# Patient Record
Sex: Female | Born: 1980 | Race: White | Hispanic: Yes | Marital: Married | Smoking: Never smoker
Health system: Southern US, Community
[De-identification: ages and names within clinical notes are randomized; demographics above are authoritative.]

## PROBLEM LIST (undated history)

## (undated) ENCOUNTER — Inpatient Hospital Stay (HOSPITAL_COMMUNITY): Payer: Self-pay

## (undated) DIAGNOSIS — Z789 Other specified health status: Secondary | ICD-10-CM

## (undated) DIAGNOSIS — Z8619 Personal history of other infectious and parasitic diseases: Secondary | ICD-10-CM

## (undated) DIAGNOSIS — K219 Gastro-esophageal reflux disease without esophagitis: Secondary | ICD-10-CM

## (undated) DIAGNOSIS — O24419 Gestational diabetes mellitus in pregnancy, unspecified control: Secondary | ICD-10-CM

## (undated) DIAGNOSIS — S82899A Other fracture of unspecified lower leg, initial encounter for closed fracture: Secondary | ICD-10-CM

## (undated) HISTORY — DX: Gestational diabetes mellitus in pregnancy, unspecified control: O24.419

## (undated) HISTORY — DX: Personal history of other infectious and parasitic diseases: Z86.19

## (undated) HISTORY — PX: NO PAST SURGERIES: SHX2092

---

## 2001-01-14 ENCOUNTER — Emergency Department (HOSPITAL_COMMUNITY): Admission: EM | Admit: 2001-01-14 | Discharge: 2001-01-14 | Payer: Self-pay | Admitting: Emergency Medicine

## 2001-06-19 ENCOUNTER — Encounter: Payer: Self-pay | Admitting: Emergency Medicine

## 2001-06-19 ENCOUNTER — Emergency Department (HOSPITAL_COMMUNITY): Admission: EM | Admit: 2001-06-19 | Discharge: 2001-06-20 | Payer: Self-pay | Admitting: Emergency Medicine

## 2001-07-03 ENCOUNTER — Encounter: Payer: Self-pay | Admitting: *Deleted

## 2001-07-03 ENCOUNTER — Inpatient Hospital Stay (HOSPITAL_COMMUNITY): Admission: AD | Admit: 2001-07-03 | Discharge: 2001-07-03 | Payer: Self-pay | Admitting: *Deleted

## 2001-07-04 ENCOUNTER — Inpatient Hospital Stay (HOSPITAL_COMMUNITY): Admission: AD | Admit: 2001-07-04 | Discharge: 2001-07-04 | Payer: Self-pay | Admitting: Obstetrics and Gynecology

## 2001-10-02 ENCOUNTER — Encounter (HOSPITAL_COMMUNITY): Admission: RE | Admit: 2001-10-02 | Discharge: 2001-11-01 | Payer: Self-pay | Admitting: *Deleted

## 2001-10-09 ENCOUNTER — Encounter: Payer: Self-pay | Admitting: *Deleted

## 2001-11-06 ENCOUNTER — Encounter (HOSPITAL_COMMUNITY): Admission: RE | Admit: 2001-11-06 | Discharge: 2001-12-06 | Payer: Self-pay | Admitting: *Deleted

## 2001-12-06 ENCOUNTER — Inpatient Hospital Stay (HOSPITAL_COMMUNITY): Admission: AD | Admit: 2001-12-06 | Discharge: 2001-12-09 | Payer: Self-pay | Admitting: *Deleted

## 2001-12-10 ENCOUNTER — Encounter: Admission: RE | Admit: 2001-12-10 | Discharge: 2002-01-09 | Payer: Self-pay | Admitting: *Deleted

## 2002-02-09 ENCOUNTER — Encounter: Admission: RE | Admit: 2002-02-09 | Discharge: 2002-03-11 | Payer: Self-pay | Admitting: *Deleted

## 2002-03-12 ENCOUNTER — Encounter: Admission: RE | Admit: 2002-03-12 | Discharge: 2002-04-11 | Payer: Self-pay | Admitting: *Deleted

## 2008-06-16 ENCOUNTER — Inpatient Hospital Stay (HOSPITAL_COMMUNITY): Admission: RE | Admit: 2008-06-16 | Discharge: 2008-06-18 | Payer: Self-pay | Admitting: Obstetrics

## 2010-05-04 LAB — CBC
HCT: 33.1 % — ABNORMAL LOW (ref 36.0–46.0)
HCT: 35.7 % — ABNORMAL LOW (ref 36.0–46.0)
Hemoglobin: 11.6 g/dL — ABNORMAL LOW (ref 12.0–15.0)
Hemoglobin: 12.4 g/dL (ref 12.0–15.0)
MCHC: 34.6 g/dL (ref 30.0–36.0)
MCHC: 35.1 g/dL (ref 30.0–36.0)
MCV: 92.2 fL (ref 78.0–100.0)
MCV: 92.2 fL (ref 78.0–100.0)
Platelets: 182 10*3/uL (ref 150–400)
Platelets: 205 10*3/uL (ref 150–400)
RBC: 3.59 MIL/uL — ABNORMAL LOW (ref 3.87–5.11)
RBC: 3.88 MIL/uL (ref 3.87–5.11)
RDW: 15 % (ref 11.5–15.5)
RDW: 15.1 % (ref 11.5–15.5)
WBC: 10.6 10*3/uL — ABNORMAL HIGH (ref 4.0–10.5)
WBC: 16.1 10*3/uL — ABNORMAL HIGH (ref 4.0–10.5)

## 2010-05-04 LAB — CCBB MATERNAL DONOR DRAW

## 2010-05-04 LAB — RPR: RPR Ser Ql: NONREACTIVE

## 2010-05-04 LAB — RAPID HIV SCREEN (WH-MAU): Rapid HIV Screen: NONREACTIVE

## 2014-07-15 ENCOUNTER — Encounter (HOSPITAL_COMMUNITY): Payer: Self-pay | Admitting: *Deleted

## 2014-07-15 ENCOUNTER — Inpatient Hospital Stay (HOSPITAL_COMMUNITY): Payer: Self-pay

## 2014-07-15 ENCOUNTER — Inpatient Hospital Stay (HOSPITAL_COMMUNITY)
Admission: AD | Admit: 2014-07-15 | Discharge: 2014-07-15 | Disposition: A | Discharge status: Home / Self Care | Payer: Self-pay | Source: Ambulatory Visit | Attending: Family Medicine | Admitting: Family Medicine

## 2014-07-15 DIAGNOSIS — B9689 Other specified bacterial agents as the cause of diseases classified elsewhere: Secondary | ICD-10-CM | POA: Insufficient documentation

## 2014-07-15 DIAGNOSIS — Z3A11 11 weeks gestation of pregnancy: Secondary | ICD-10-CM | POA: Insufficient documentation

## 2014-07-15 DIAGNOSIS — O209 Hemorrhage in early pregnancy, unspecified: Secondary | ICD-10-CM | POA: Insufficient documentation

## 2014-07-15 DIAGNOSIS — A499 Bacterial infection, unspecified: Secondary | ICD-10-CM

## 2014-07-15 DIAGNOSIS — O23591 Infection of other part of genital tract in pregnancy, first trimester: Secondary | ICD-10-CM | POA: Insufficient documentation

## 2014-07-15 DIAGNOSIS — N760 Acute vaginitis: Secondary | ICD-10-CM | POA: Insufficient documentation

## 2014-07-15 HISTORY — DX: Other specified health status: Z78.9

## 2014-07-15 LAB — URINALYSIS, ROUTINE W REFLEX MICROSCOPIC
BILIRUBIN URINE: NEGATIVE
Glucose, UA: NEGATIVE mg/dL
KETONES UR: NEGATIVE mg/dL
Leukocytes, UA: NEGATIVE
Nitrite: NEGATIVE
PROTEIN: NEGATIVE mg/dL
Specific Gravity, Urine: <1.005 — ABNORMAL LOW (ref 1.005–1.030)
UROBILINOGEN UA: 0.2 mg/dL (ref 0.0–1.0)
pH: 6.5 (ref 5.0–8.0)

## 2014-07-15 LAB — CBC
HEMATOCRIT: 34.9 % — AB (ref 36.0–46.0)
Hemoglobin: 12.1 g/dL (ref 12.0–15.0)
MCH: 30.9 pg (ref 26.0–34.0)
MCHC: 34.7 g/dL (ref 30.0–36.0)
MCV: 89 fL (ref 78.0–100.0)
Platelets: 227 10*3/uL (ref 150–400)
RBC: 3.92 MIL/uL (ref 3.87–5.11)
RDW: 13.5 % (ref 11.5–15.5)
WBC: 11.5 10*3/uL — AB (ref 4.0–10.5)

## 2014-07-15 LAB — WET PREP, GENITAL
TRICH WET PREP: NONE SEEN
Yeast Wet Prep HPF POC: NONE SEEN

## 2014-07-15 LAB — URINE MICROSCOPIC-ADD ON

## 2014-07-15 LAB — HCG, QUANTITATIVE, PREGNANCY: HCG, BETA CHAIN, QUANT, S: 46399 m[IU]/mL — AB (ref <5)

## 2014-07-15 LAB — POCT PREGNANCY, URINE: PREG TEST UR: POSITIVE — AB

## 2014-07-15 LAB — ABO/RH: ABO/RH(D): O POS

## 2014-07-15 MED ORDER — METRONIDAZOLE 500 MG PO TABS: 500.0000 mg | ORAL_TABLET | Freq: Two times a day (BID) | ORAL | Status: DC

## 2014-07-15 NOTE — MAU Note (Signed)
HAving some vag bleeding when wipes. Has seen pink d/c when wipes for 3 days but today is red and more. Some pressure lower abd that comes and goes

## 2014-07-15 NOTE — MAU Provider Note (Signed)
History     CSN: [id number]  Arrival date and time: 07/15/14 1552   First Provider Initiated Contact with Patient 07/15/14 1656      Chief Complaint  Patient presents with  . Vaginal Bleeding   HPI Pt is [redacted]w[redacted]d pregnant with c/o of vaginal spotting noticed when wiping for 3 days. Pt denies pain with urination, constipation or diarrhea, chills or fever Pt also notes some lower abdominal pressure that comes and goes Pt plans to see Dr. Gaynell Face for her OB care RN note:  Registered Nurse Signed  MAU Note 07/15/2014 4:14 PM    Expand All Collapse All   HAving some vag bleeding when wipes. Has seen pink d/c when wipes for 3 days but today is red and more. Some pressure lower abd that comes and goes       Past Medical History  Diagnosis Date  . Medical history non-contributory     Past Surgical History  Procedure Laterality Date  . No past surgeries      Family History  Problem Relation Age of Onset  . Diabetes Paternal Uncle     History  Substance Use Topics  . Smoking status: Never Smoker   . Smokeless tobacco: Not on file  . Alcohol Use: No    Allergies: No Known Allergies  Prescriptions prior to admission  Medication Sig Dispense Refill Last Dose  . Prenatal Vit-Fe Fumarate-FA (PRENATAL MULTIVITAMIN) TABS tablet Take 1 tablet by mouth daily at 12 noon.   07/15/2014 at Unknown time    ROS Physical Exam   Blood pressure 112/54, pulse 108, temperature 98.5 F (36.9 C), resp. rate 18, last menstrual period 03/29/2014.  Physical Exam  Vitals reviewed. Constitutional: She is oriented to person, place, and time. She appears well-developed and well-nourished. No distress.  HENT:  Head: Normocephalic.  Eyes: Pupils are equal, round, and reactive to light.  Neck: Normal range of motion. Neck supple.  Cardiovascular: Normal rate.   Respiratory: Effort normal.  GI: Soft. She exhibits no distension. There is no tenderness. There is no rebound and no guarding.   Genitourinary:  Mod amount of frothy white discharge in vault; cervix clean-  No blood at os- 2 small area on anterior of cervical lip 10 and 12 oclock slightly friable ?source of bleeding?- no active bleeding noted Uterus retroverted - unable to palpate entire uterus- NT; adnexa without palpable enlargement or tenderness  Musculoskeletal: Normal range of motion.  Neurological: She is alert and oriented to person, place, and time.  Skin: Skin is warm and dry.  Psychiatric: She has a normal mood and affect.    MAU Course  Procedures Results for orders placed or performed during the hospital encounter of 07/15/14 (from the past 24 hour(s))  Urinalysis, Routine w reflex microscopic (not at [facility])     Status: Abnormal   Collection Time: 07/15/14  4:00 PM  Result Value Ref Range   Color, Urine YELLOW YELLOW   APPearance CLEAR CLEAR   Specific Gravity, Urine <1.005 (L) 1.005 - 1.030   pH 6.5 5.0 - 8.0   Glucose, UA NEGATIVE NEGATIVE mg/dL   Hgb urine dipstick SMALL (A) NEGATIVE   Bilirubin Urine NEGATIVE NEGATIVE   Ketones, ur NEGATIVE NEGATIVE mg/dL   Protein, ur NEGATIVE NEGATIVE mg/dL   Urobilinogen, UA 0.2 0.0 - 1.0 mg/dL   Nitrite NEGATIVE NEGATIVE   Leukocytes, UA NEGATIVE NEGATIVE  Urine microscopic-add on     Status: None   Collection Time: 07/15/14  4:00 PM  Result Value Ref Range   Squamous Epithelial / LPF RARE RARE   WBC, UA 0-2 <3 WBC/hpf   Bacteria, UA RARE RARE  Pregnancy, urine POC     Status: Abnormal   Collection Time: 07/15/14  4:19 PM  Result Value Ref Range   Preg Test, Ur POSITIVE (A) NEGATIVE  Wet prep, genital     Status: Abnormal   Collection Time: 07/15/14  5:10 PM  Result Value Ref Range   Yeast Wet Prep HPF POC NONE SEEN NONE SEEN   Trich, Wet Prep NONE SEEN NONE SEEN   Clue Cells Wet Prep HPF POC MANY (A) NONE SEEN   WBC, Wet Prep HPF POC MODERATE (A) NONE SEEN  CBC     Status: Abnormal   Collection Time: 07/15/14  5:11 PM  Result Value Ref Range    WBC 11.5 (H) 4.0 - 10.5 K/uL   RBC 3.92 3.87 - 5.11 MIL/uL   Hemoglobin 12.1 12.0 - 15.0 g/dL   HCT 16.1 (L) 09.6 - 04.5 %   MCV 89.0 78.0 - 100.0 fL   MCH 30.9 26.0 - 34.0 pg   MCHC 34.7 30.0 - 36.0 g/dL   RDW 40.9 81.1 - 91.4 %   Platelets 227 150 - 400 K/uL  hCG, quantitative, pregnancy     Status: Abnormal   Collection Time: 07/15/14  5:11 PM  Result Value Ref Range   hCG, Beta Chain, Quant, S 78295 (H) <5 mIU/mL  ABO/Rh     Status: None (Preliminary result)   Collection Time: 07/15/14  5:11 PM  Result Value Ref Range   ABO/RH(D) O POS   GC/chlamydia pending [place] preliminary- [redacted]w[redacted]d SLIUP with small SCH .US Ob Comp Less 14 Wks  07/15/2014   CLINICAL DATA:  Patient with vaginal bleeding and lower abdominal pressure, intermittent.  EXAM: OBSTETRIC <14 WK [place] AND TRANSVAGINAL OB US  TECHNIQUE: Both transabdominal and transvaginal ultrasound examinations were performed for complete evaluation of the gestation as well as the maternal uterus, adnexal regions, and pelvic cul-de-sac. Transvaginal technique was performed to assess early pregnancy.  COMPARISON:  None.  FINDINGS: Intrauterine gestational sac: Visualized/normal in shape.  Yolk sac:  Present  Embryo:  Present  Cardiac Activity: Present  Heart Rate: 165  bpm  CRL:  46.8  mm   11 w   4 d                  [place] EDC: 01/30/2015  Maternal uterus/adnexae: Normal right and left ovaries. Small subchorionic hemorrhage. Trace free fluid in the pelvis.  IMPRESSION: Single live intrauterine gestation 11 weeks 4 days by crown-rump length.  Small subchorionic hemorrhage.   Electronically Signed   By: Annia Belt M.D.   On: 07/15/2014 18:31   US Ob Transvaginal  07/15/2014   CLINICAL DATA:  Patient with vaginal bleeding and lower abdominal pressure, intermittent.  EXAM: OBSTETRIC <14 WK [place] AND TRANSVAGINAL OB US  TECHNIQUE: Both transabdominal and transvaginal ultrasound examinations were performed for complete evaluation of the gestation as well as the  maternal uterus, adnexal regions, and pelvic cul-de-sac. Transvaginal technique was performed to assess early pregnancy.  COMPARISON:  None.  FINDINGS: Intrauterine gestational sac: Visualized/normal in shape.  Yolk sac:  Present  Embryo:  Present  Cardiac Activity: Present  Heart Rate: 165  bpm  CRL:  46.8  mm   11 w   4 d  [place] EDC: 01/30/2015  Maternal uterus/adnexae: Normal right and left ovaries. Small subchorionic hemorrhage. Trace free fluid in the pelvis.  IMPRESSION: Single live intrauterine gestation 11 weeks 4 days by crown-rump length.  Small subchorionic hemorrhage.   Electronically Signed   By: Annia Belt M.D.   On: 07/15/2014 18:31   Assessment and Plan  Bleeding in pregnancy BV- Flagyl  BID for 7 days F/u with OB appointment with Dr. Ellin Goodie 07/15/2014, 4:59 PM

## 2014-07-16 LAB — GC/CHLAMYDIA PROBE AMP (~~LOC~~) NOT AT ARMC
Chlamydia: NEGATIVE
NEISSERIA GONORRHEA: NEGATIVE

## 2014-07-17 LAB — CYTOLOGY - PAP: Pap Smear: NEGATIVE

## 2014-07-24 LAB — OB RESULTS CONSOLE ABO/RH: RH TYPE: POSITIVE

## 2014-07-24 LAB — OB RESULTS CONSOLE HEPATITIS B SURFACE ANTIGEN: Hepatitis B Surface Ag: NEGATIVE

## 2014-07-24 LAB — OB RESULTS CONSOLE RPR: RPR: NONREACTIVE

## 2014-07-24 LAB — OB RESULTS CONSOLE GC/CHLAMYDIA
Chlamydia: NEGATIVE
GC PROBE AMP, GENITAL: NEGATIVE

## 2014-07-24 LAB — OB RESULTS CONSOLE ANTIBODY SCREEN: Antibody Screen: NEGATIVE

## 2014-07-24 LAB — OB RESULTS CONSOLE RUBELLA ANTIBODY, IGM: Rubella: IMMUNE

## 2014-07-24 LAB — OB RESULTS CONSOLE HIV ANTIBODY (ROUTINE TESTING): HIV: NONREACTIVE

## 2015-01-06 LAB — OB RESULTS CONSOLE GBS: STREP GROUP B AG: NEGATIVE

## 2015-01-25 NOTE — L&D Delivery Note (Signed)
Delivery Note At 6:54 AM a viable female was delivered via  (Presentation: ;  ).  APGAR: , ; weight  .   Placenta status: , .  Cord:  with the following complications: .  Cord pH: not done  Anesthesia:   Episiotomy:   Lacerations:   Suture Repair: 2.0 Est. Blood Loss (mL):    Mom to postpartum.  Baby to Couplet care / Skin to Skin.  Joanna Whitney A 02/08/2015, 7:01 AM

## 2015-02-05 ENCOUNTER — Other Ambulatory Visit: Payer: Self-pay | Admitting: Obstetrics

## 2015-02-06 ENCOUNTER — Encounter (HOSPITAL_COMMUNITY): Payer: Self-pay | Admitting: *Deleted

## 2015-02-06 ENCOUNTER — Telehealth (HOSPITAL_COMMUNITY): Payer: Self-pay | Admitting: *Deleted

## 2015-02-06 NOTE — Telephone Encounter (Signed)
Preadmission screen  

## 2015-02-06 NOTE — Telephone Encounter (Signed)
interpreter number [phone]

## 2015-02-07 ENCOUNTER — Inpatient Hospital Stay (HOSPITAL_COMMUNITY)
Admission: AD | Admit: 2015-02-07 | Discharge: 2015-02-09 | DRG: 775 | Disposition: A | Discharge status: Home / Self Care | Payer: Medicaid Other | Source: Ambulatory Visit | Attending: Obstetrics | Admitting: Obstetrics

## 2015-02-07 DIAGNOSIS — Z3A40 40 weeks gestation of pregnancy: Secondary | ICD-10-CM

## 2015-02-07 DIAGNOSIS — O480 Post-term pregnancy: Principal | ICD-10-CM | POA: Diagnosis present

## 2015-02-07 NOTE — MAU Note (Signed)
Contractions since 2100. Some bloody show. Denies LOF. For IOL Sunday am.

## 2015-02-08 ENCOUNTER — Inpatient Hospital Stay (HOSPITAL_COMMUNITY): Payer: Medicaid Other | Admitting: Anesthesiology

## 2015-02-08 ENCOUNTER — Inpatient Hospital Stay (HOSPITAL_COMMUNITY): Admission: RE | Admit: 2015-02-08 | Payer: Self-pay | Source: Ambulatory Visit

## 2015-02-08 ENCOUNTER — Encounter (HOSPITAL_COMMUNITY): Payer: Self-pay

## 2015-02-08 DIAGNOSIS — O480 Post-term pregnancy: Secondary | ICD-10-CM | POA: Diagnosis present

## 2015-02-08 DIAGNOSIS — Z3A40 40 weeks gestation of pregnancy: Secondary | ICD-10-CM | POA: Diagnosis not present

## 2015-02-08 LAB — CBC
HEMATOCRIT: 35.4 % — AB (ref 36.0–46.0)
HEMOGLOBIN: 12.3 g/dL (ref 12.0–15.0)
MCH: 31.3 pg (ref 26.0–34.0)
MCHC: 34.7 g/dL (ref 30.0–36.0)
MCV: 90.1 fL (ref 78.0–100.0)
Platelets: 216 10*3/uL (ref 150–400)
RBC: 3.93 MIL/uL (ref 3.87–5.11)
RDW: 14.1 % (ref 11.5–15.5)
WBC: 13.8 10*3/uL — ABNORMAL HIGH (ref 4.0–10.5)

## 2015-02-08 LAB — RPR: RPR: NONREACTIVE

## 2015-02-08 LAB — TYPE AND SCREEN
ABO/RH(D): O POS
Antibody Screen: NEGATIVE

## 2015-02-08 MED ORDER — INFLUENZA VAC SPLIT QUAD 0.5 ML IM SUSY: 0.5000 mL | PREFILLED_SYRINGE | INTRAMUSCULAR | Status: DC

## 2015-02-08 MED ORDER — OXYCODONE-ACETAMINOPHEN 5-325 MG PO TABS: 2.0000 | ORAL_TABLET | ORAL | Status: DC | PRN

## 2015-02-08 MED ORDER — PROMETHAZINE HCL 25 MG/ML IJ SOLN: 12.5000 mg | Freq: Four times a day (QID) | INTRAMUSCULAR | Status: DC | PRN

## 2015-02-08 MED ORDER — PRENATAL MULTIVITAMIN CH
1.0000 | ORAL_TABLET | Freq: Every day | ORAL | Status: DC
  Administered 2015-02-08 – 2015-02-09 (×2): 1 via ORAL
  Filled 2015-02-08 (×2): qty 1

## 2015-02-08 MED ORDER — OXYTOCIN 10 UNIT/ML IJ SOLN
1.0000 m[IU]/min | INTRAVENOUS | Status: DC
  Administered 2015-02-08: 2 m[IU]/min via INTRAVENOUS
  Filled 2015-02-08: qty 4

## 2015-02-08 MED ORDER — LACTATED RINGERS IV SOLN: 500.0000 mL | INTRAVENOUS | Status: DC | PRN

## 2015-02-08 MED ORDER — ZOLPIDEM TARTRATE 5 MG PO TABS
5.0000 mg | ORAL_TABLET | Freq: Every evening | ORAL | Status: DC | PRN
Start: 2015-02-08 — End: 2015-02-09

## 2015-02-08 MED ORDER — TERBUTALINE SULFATE 1 MG/ML IJ SOLN
0.2500 mg | Freq: Once | INTRAMUSCULAR | Status: DC | PRN
  Filled 2015-02-08: qty 1

## 2015-02-08 MED ORDER — IBUPROFEN 600 MG PO TABS
600.0000 mg | ORAL_TABLET | Freq: Four times a day (QID) | ORAL | Status: DC
  Administered 2015-02-08 – 2015-02-09 (×4): 600 mg via ORAL
  Filled 2015-02-08 (×5): qty 1

## 2015-02-08 MED ORDER — BUTORPHANOL TARTRATE 1 MG/ML IJ SOLN: 1.0000 mg | INTRAMUSCULAR | Status: DC | PRN

## 2015-02-08 MED ORDER — SIMETHICONE 80 MG PO CHEW: 80.0000 mg | CHEWABLE_TABLET | ORAL | Status: DC | PRN

## 2015-02-08 MED ORDER — CITRIC ACID-SODIUM CITRATE 334-500 MG/5ML PO SOLN: 30.0000 mL | ORAL | Status: DC | PRN

## 2015-02-08 MED ORDER — DIPHENHYDRAMINE HCL 50 MG/ML IJ SOLN: 12.5000 mg | INTRAMUSCULAR | Status: DC | PRN

## 2015-02-08 MED ORDER — OXYTOCIN 10 UNIT/ML IJ SOLN: 2.5000 [IU]/h | INTRAVENOUS | Status: DC

## 2015-02-08 MED ORDER — LANOLIN HYDROUS EX OINT: TOPICAL_OINTMENT | CUTANEOUS | Status: DC | PRN

## 2015-02-08 MED ORDER — TETANUS-DIPHTH-ACELL PERTUSSIS 5-2.5-18.5 LF-MCG/0.5 IM SUSP: 0.5000 mL | Freq: Once | INTRAMUSCULAR | Status: DC

## 2015-02-08 MED ORDER — TETANUS-DIPHTH-ACELL PERTUSSIS 5-2.5-18.5 LF-MCG/0.5 IM SUSP
0.5000 mL | Freq: Once | INTRAMUSCULAR | Status: AC
  Administered 2015-02-08: 0.5 mL via INTRAMUSCULAR
  Filled 2015-02-08: qty 0.5

## 2015-02-08 MED ORDER — BENZOCAINE-MENTHOL 20-0.5 % EX AERO: 1.0000 "application " | INHALATION_SPRAY | CUTANEOUS | Status: DC | PRN

## 2015-02-08 MED ORDER — FLEET ENEMA 7-19 GM/118ML RE ENEM: 1.0000 | ENEMA | RECTAL | Status: DC | PRN

## 2015-02-08 MED ORDER — WITCH HAZEL-GLYCERIN EX PADS: 1.0000 "application " | MEDICATED_PAD | CUTANEOUS | Status: DC | PRN

## 2015-02-08 MED ORDER — ACETAMINOPHEN 325 MG PO TABS: 650.0000 mg | ORAL_TABLET | ORAL | Status: DC | PRN

## 2015-02-08 MED ORDER — DIPHENHYDRAMINE HCL 25 MG PO CAPS: 25.0000 mg | ORAL_CAPSULE | Freq: Four times a day (QID) | ORAL | Status: DC | PRN

## 2015-02-08 MED ORDER — ONDANSETRON HCL 4 MG PO TABS: 4.0000 mg | ORAL_TABLET | ORAL | Status: DC | PRN

## 2015-02-08 MED ORDER — LACTATED RINGERS IV SOLN
INTRAVENOUS | Status: DC
  Administered 2015-02-08: 05:00:00 via INTRAVENOUS

## 2015-02-08 MED ORDER — FENTANYL 2.5 MCG/ML BUPIVACAINE 1/10 % EPIDURAL INFUSION (WH - ANES)
14.0000 mL/h | INTRAMUSCULAR | Status: DC | PRN
  Administered 2015-02-08 (×2): 14 mL/h via EPIDURAL
  Filled 2015-02-08: qty 125

## 2015-02-08 MED ORDER — SENNOSIDES-DOCUSATE SODIUM 8.6-50 MG PO TABS
2.0000 | ORAL_TABLET | ORAL | Status: DC
  Administered 2015-02-08: 2 via ORAL
  Filled 2015-02-08: qty 2

## 2015-02-08 MED ORDER — LIDOCAINE HCL (PF) 1 % IJ SOLN
INTRAMUSCULAR | Status: DC | PRN
  Administered 2015-02-08: 2 mL via EPIDURAL
  Administered 2015-02-08: 3 mL via EPIDURAL
  Administered 2015-02-08: 5 mL via EPIDURAL

## 2015-02-08 MED ORDER — ONDANSETRON HCL 4 MG/2ML IJ SOLN: 4.0000 mg | Freq: Four times a day (QID) | INTRAMUSCULAR | Status: DC | PRN

## 2015-02-08 MED ORDER — LIDOCAINE HCL (PF) 1 % IJ SOLN
30.0000 mL | INTRAMUSCULAR | Status: DC | PRN
  Filled 2015-02-08: qty 30

## 2015-02-08 MED ORDER — PHENYLEPHRINE 40 MCG/ML (10ML) SYRINGE FOR IV PUSH (FOR BLOOD PRESSURE SUPPORT)
80.0000 ug | PREFILLED_SYRINGE | INTRAVENOUS | Status: DC | PRN
  Filled 2015-02-08: qty 2
  Filled 2015-02-08: qty 20

## 2015-02-08 MED ORDER — OXYCODONE-ACETAMINOPHEN 5-325 MG PO TABS: 1.0000 | ORAL_TABLET | ORAL | Status: DC | PRN

## 2015-02-08 MED ORDER — OXYTOCIN BOLUS FROM INFUSION
500.0000 mL | INTRAVENOUS | Status: DC
  Administered 2015-02-08: 500 mL via INTRAVENOUS

## 2015-02-08 MED ORDER — FENTANYL CITRATE (PF) 100 MCG/2ML IJ SOLN: 50.0000 ug | INTRAMUSCULAR | Status: DC | PRN

## 2015-02-08 MED ORDER — ONDANSETRON HCL 4 MG/2ML IJ SOLN: 4.0000 mg | INTRAMUSCULAR | Status: DC | PRN

## 2015-02-08 MED ORDER — EPHEDRINE 5 MG/ML INJ
10.0000 mg | INTRAVENOUS | Status: DC | PRN
  Filled 2015-02-08: qty 2

## 2015-02-08 MED ORDER — FERROUS SULFATE 325 (65 FE) MG PO TABS
325.0000 mg | ORAL_TABLET | Freq: Two times a day (BID) | ORAL | Status: DC
  Administered 2015-02-08 – 2015-02-09 (×2): 325 mg via ORAL
  Filled 2015-02-08 (×2): qty 1

## 2015-02-08 MED ORDER — DIBUCAINE 1 % RE OINT: 1.0000 "application " | TOPICAL_OINTMENT | RECTAL | Status: DC | PRN

## 2015-02-08 MED ORDER — TERBUTALINE SULFATE 1 MG/ML IJ SOLN: 0.2500 mg | Freq: Once | INTRAMUSCULAR | Status: DC | PRN

## 2015-02-08 NOTE — Anesthesia Postprocedure Evaluation (Signed)
Anesthesia Post Note  Patient: Joanna DrownMaria Whitney  Procedure(s) Performed: * No procedures listed *  Patient location during evaluation: Mother Baby Anesthesia Type: Epidural Level of consciousness: awake Pain management: pain level controlled Vital Signs Assessment: post-procedure vital signs reviewed and stable Respiratory status: spontaneous breathing Cardiovascular status: stable Postop Assessment: no headache, no backache, epidural receding, patient able to bend at knees, no signs of nausea or vomiting and adequate PO intake Anesthetic complications: no    Last Vitals:  Filed Vitals:   02/08/15 0905 02/08/15 1400  BP: 94/55 102/53  Pulse: 98 90  Temp: 37 C 36.7 C  Resp: 18     Last Pain:  Filed Vitals:   02/08/15 1440  PainSc: 3                  Allyse Fregeau

## 2015-02-08 NOTE — Anesthesia Procedure Notes (Signed)

## 2015-02-08 NOTE — Anesthesia Preprocedure Evaluation (Signed)
Anesthesia Evaluation  Patient identified by MRN, date of birth, ID band Patient awake    Reviewed: Allergy & Precautions, NPO status , Patient's Chart, lab work & pertinent test results  History of Anesthesia Complications Negative for: history of anesthetic complications  Airway Mallampati: III  TM Distance: >3 FB Neck ROM: Full    Dental  (+) Teeth Intact, Dental Advisory Given   Pulmonary neg pulmonary ROS,    Pulmonary exam normal breath sounds clear to auscultation       Cardiovascular Exercise Tolerance: Good negative cardio ROS Normal cardiovascular exam Rhythm:Regular Rate:Normal     Neuro/Psych negative neurological ROS  negative psych ROS   GI/Hepatic negative GI ROS, Neg liver ROS,   Endo/Other  Obesity   Renal/GU negative Renal ROS     Musculoskeletal negative musculoskeletal ROS (+)   Abdominal   Peds  Hematology negative hematology ROS (+) Plt 216k    Anesthesia Other Findings Day of surgery medications reviewed with the patient.  Reproductive/Obstetrics (+) Pregnancy                             Anesthesia Physical Anesthesia Plan  ASA: II  Anesthesia Plan: Epidural   Post-op Pain Management:    Induction:   Airway Management Planned:   Additional Equipment:   Intra-op Plan:   Post-operative Plan:   Informed Consent: I have reviewed the patients History and Physical, chart, labs and discussed the procedure including the risks, benefits and alternatives for the proposed anesthesia with the patient or authorized representative who has indicated his/her understanding and acceptance.   Dental advisory given  Plan Discussed with:   Anesthesia Plan Comments: (Patient identified. Risks/Benefits/Options discussed with patient including but not limited to bleeding, infection, nerve damage, paralysis, failed block, incomplete pain control, headache, blood  pressure changes, nausea, vomiting, reactions to medication both or allergic, itching and postpartum back pain. Confirmed with bedside nurse the patient's most recent platelet count. Confirmed with patient that they are not currently taking any anticoagulation, have any bleeding history or any family history of bleeding disorders. Patient expressed understanding and wished to proceed. All questions were answered. )        Anesthesia Quick Evaluation

## 2015-02-08 NOTE — H&P (Signed)
This is Dr. Francoise CeoBernard Tabias Swayze dictating the history and physical on Joanna Whitney  she's a 35 year old gravida 3 para 20 to do 02/02/2015 admitted in early labor negative GBS cervix 3 cm and she is now in the process again an epidural Past medical history negative Past surgical history negative Social history negative System review negative Physical exam well-developed female in early labor HEENT negative Lungs clear to P&A Heart regular rhythm no murmurs no gallops Breasts negative Abdomen term Pelvic as described above Extremities negative

## 2015-02-09 LAB — CBC
HCT: 33.5 % — ABNORMAL LOW (ref 36.0–46.0)
Hemoglobin: 11.5 g/dL — ABNORMAL LOW (ref 12.0–15.0)
MCH: 31.3 pg (ref 26.0–34.0)
MCHC: 34.3 g/dL (ref 30.0–36.0)
MCV: 91 fL (ref 78.0–100.0)
PLATELETS: 173 10*3/uL (ref 150–400)
RBC: 3.68 MIL/uL — AB (ref 3.87–5.11)
RDW: 14.3 % (ref 11.5–15.5)
WBC: 12.2 10*3/uL — ABNORMAL HIGH (ref 4.0–10.5)

## 2015-02-09 NOTE — Progress Notes (Signed)
UR chart review completed.  

## 2015-02-09 NOTE — Discharge Instructions (Signed)
Discharge instructions   You can wash your hair  Shower  Eat what you want  Drink what you want  See me in 6 weeks  Your ankles are going to swell more in the next 2 weeks than when pregnant  No sex for 6 weeks   Chrisann Melaragno A, MD 02/09/2015

## 2015-02-09 NOTE — Progress Notes (Signed)
I stopped by to check on patient's needs.  Eda H Royal Interpreter. °

## 2015-02-09 NOTE — Discharge Summary (Signed)
Obstetric Discharge Summary Reason for Admission: onset of labor Prenatal Procedures: none Intrapartum Procedures: spontaneous vaginal delivery Postpartum Procedures: none Complications-Operative and Postpartum: none HEMOGLOBIN  Date Value Ref Range Status  02/08/2015 12.3 12.0 - 15.0 g/dL Final   HCT  Date Value Ref Range Status  02/08/2015 35.4* 36.0 - 46.0 % Final    Physical Exam:  General: alert Lochia: appropriate Uterine Fundus: firm Incision: healing well DVT Evaluation: No evidence of DVT seen on physical exam.  Discharge Diagnoses: Term Pregnancy-delivered  Discharge Information: Date: 02/09/2015 Activity: pelvic rest Diet: routine Medications: Percocet Condition: stable Instructions: refer to practice specific booklet Discharge to: home Follow-up Information    Follow up with MARSHALL,BERNARD A, MD In 6 weeks.   Specialty:  Obstetrics and Gynecology   Contact information:   [address] [place] [address] [phone]       Newborn Data: Live born female  Birth Weight: 7 lb 10.4 oz (3470 g) APGAR: 9, 9  Home with mother.  MARSHALL,BERNARD A 02/09/2015, 6:35 AM [place]

## 2015-02-09 NOTE — Progress Notes (Signed)
Patient ID: Joanna DrownMaria Whitney, female   DOB: July 18, 1980, 35 y.o.   MRN: [id number] On postpartum day one Blood pressure 105/49 pulse 89 respiration 18 Fundus firm Lochia moderate patient wants early discharge

## 2015-02-16 NOTE — Progress Notes (Signed)
Vital signs normal Fundus firm Lochia moderate Legs negative Doing well

## 2015-10-02 ENCOUNTER — Encounter: Payer: Self-pay | Admitting: *Deleted

## 2017-01-21 ENCOUNTER — Inpatient Hospital Stay (HOSPITAL_COMMUNITY): Payer: Self-pay

## 2017-01-21 ENCOUNTER — Other Ambulatory Visit: Payer: Self-pay

## 2017-01-21 ENCOUNTER — Inpatient Hospital Stay (HOSPITAL_COMMUNITY)
Admission: AD | Admit: 2017-01-21 | Discharge: 2017-01-21 | Disposition: A | Discharge status: Home / Self Care | Payer: Self-pay | Source: Ambulatory Visit | Attending: Obstetrics and Gynecology | Admitting: Obstetrics and Gynecology

## 2017-01-21 ENCOUNTER — Encounter (HOSPITAL_COMMUNITY): Payer: Self-pay | Admitting: *Deleted

## 2017-01-21 DIAGNOSIS — O26891 Other specified pregnancy related conditions, first trimester: Secondary | ICD-10-CM

## 2017-01-21 DIAGNOSIS — Z3491 Encounter for supervision of normal pregnancy, unspecified, first trimester: Secondary | ICD-10-CM

## 2017-01-21 DIAGNOSIS — M549 Dorsalgia, unspecified: Secondary | ICD-10-CM | POA: Insufficient documentation

## 2017-01-21 DIAGNOSIS — R109 Unspecified abdominal pain: Secondary | ICD-10-CM

## 2017-01-21 DIAGNOSIS — Z3A01 Less than 8 weeks gestation of pregnancy: Secondary | ICD-10-CM | POA: Insufficient documentation

## 2017-01-21 DIAGNOSIS — O209 Hemorrhage in early pregnancy, unspecified: Secondary | ICD-10-CM

## 2017-01-21 LAB — WET PREP, GENITAL
Clue Cells Wet Prep HPF POC: NONE SEEN
Sperm: NONE SEEN
Trich, Wet Prep: NONE SEEN
Yeast Wet Prep HPF POC: NONE SEEN

## 2017-01-21 LAB — URINALYSIS, ROUTINE W REFLEX MICROSCOPIC
Bilirubin Urine: NEGATIVE
Glucose, UA: NEGATIVE mg/dL
Hgb urine dipstick: NEGATIVE
Ketones, ur: NEGATIVE mg/dL
Leukocytes, UA: NEGATIVE
Nitrite: NEGATIVE
Protein, ur: NEGATIVE mg/dL
Specific Gravity, Urine: 1.01 (ref 1.005–1.030)
pH: 6 (ref 5.0–8.0)

## 2017-01-21 LAB — POCT PREGNANCY, URINE: Preg Test, Ur: POSITIVE — AB

## 2017-01-21 MED ORDER — PREPLUS 27-1 MG PO TABS: 1.0000 | ORAL_TABLET | Freq: Every day | ORAL | 13 refills | Status: DC

## 2017-01-21 NOTE — MAU Note (Signed)
Last night, she had pain and bleeding. No bleeding now, still having a little pain, in back and around to the front.

## 2017-01-21 NOTE — MAU Provider Note (Signed)
Chief Complaint: Back Pain and Abdominal Pain   First Provider Initiated Contact with Patient 01/21/17 1638      SUBJECTIVE HPI: Joanna Whitney is a 36 y.o. G4P3003 at 2751w6d by LMP who presents to maternity admissions reporting abdominal pain and vaginal bleeding. She reports abdominal pain that started last night as intermittent cramping. Abdominal pain began to increase around 2000 last night when she started spotting, describes bleeding as "light pink mixed with red streaks". States bleeding lasted for about an hour then went away. She denies bleeding currently. Abdominal pain today is back down to intermittent cramping and she rates that pain as 3/10. She denies vaginal itching/burning, urinary symptoms, h/a, dizziness, n/v, or fever/chills. [facility]   Past Medical History:  Diagnosis Date  . Hx of toxoplasmosis   . Medical history non-contributory    Past Surgical History:  Procedure Laterality Date  . NO PAST SURGERIES     Social History   Socioeconomic History  . Marital status: Single    Spouse name: Not on file  . Number of children: Not on file  . Years of education: Not on file  . Highest education level: Not on file  Social Needs  . Financial resource strain: Not on file  . Food insecurity - worry: Not on file  . Food insecurity - inability: Not on file  . Transportation needs - medical: Not on file  . Transportation needs - non-medical: Not on file  Occupational History  . Not on file  Tobacco Use  . Smoking status: Never Smoker  . Smokeless tobacco: Never Used  Substance and Sexual Activity  . Alcohol use: No  . Drug use: No  . Sexual activity: Yes  Other Topics Concern  . Not on file  Social History Narrative  . Not on file   No current facility-administered medications on file prior to encounter.    No current outpatient medications on file prior to encounter.   No Known  Allergies  ROS:  Review of Systems  Constitutional: Negative.   Respiratory: Negative.   Cardiovascular: Negative.   Gastrointestinal: Positive for abdominal pain. Negative for constipation, diarrhea, nausea and vomiting.  Genitourinary: Positive for vaginal bleeding. Negative for difficulty urinating, dysuria, frequency, pelvic pain, urgency, vaginal discharge and vaginal pain.  Musculoskeletal: Negative.   Skin: Negative.   Neurological: Negative.   Psychiatric/Behavioral: Negative.    I have reviewed patient's Past Medical Hx, Surgical Hx, Family Hx, Social Hx, medications and allergies.   Physical Exam   Patient Vitals for the past 24 hrs:  BP Temp Temp src Pulse Resp SpO2 Weight  01/21/17 1411 130/64 98.4 F (36.9 C) Oral 89 16 100 % 175 lb 12 oz (79.7 kg)   Constitutional: Well-developed, well-nourished female in no acute distress.  Cardiovascular: normal rate Respiratory: normal effort GI: Abd soft, non-tender. Pos BS x 4 MS: Extremities nontender, no edema, normal ROM Neurologic: Alert and oriented x 4.  GU: Neg CVAT.  PELVIC EXAM: Cervix pink, visually closed, without lesion, scant white creamy discharge, no vaginal bleeding present, vaginal walls and external genitalia normal Bimanual exam: Cervix 0/long/high, firm, anterior, neg CMT, uterus nontender, nonenlarged, adnexa without tenderness, enlargement, or mass  LAB RESULTS Results for orders placed or performed during the hospital encounter of 01/21/17 (from the past 24 hour(s))  Urinalysis, Routine w reflex microscopic     Status: Abnormal   Collection Time: 01/21/17  2:12 PM  Result Value Ref Range   Color, Urine STRAW (A) YELLOW   APPearance CLEAR CLEAR   Specific Gravity, Urine 1.010 1.005 - 1.030   pH 6.0 5.0 - 8.0   Glucose, UA NEGATIVE NEGATIVE mg/dL   Hgb urine dipstick NEGATIVE NEGATIVE   Bilirubin Urine NEGATIVE NEGATIVE   Ketones, ur NEGATIVE NEGATIVE mg/dL   Protein, ur NEGATIVE NEGATIVE mg/dL    Nitrite NEGATIVE NEGATIVE   Leukocytes, UA NEGATIVE NEGATIVE  Pregnancy, urine POC     Status: Abnormal   Collection Time: 01/21/17  2:43 PM  Result Value Ref Range   Preg Test, Ur POSITIVE (A) NEGATIVE  Wet prep, genital     Status: Abnormal   Collection Time: 01/21/17  4:40 PM  Result Value Ref Range   Yeast Wet Prep HPF POC NONE SEEN NONE SEEN   Trich, Wet Prep NONE SEEN NONE SEEN   Clue Cells Wet Prep HPF POC NONE SEEN NONE SEEN   WBC, Wet Prep HPF POC MODERATE (A) NONE SEEN   Sperm NONE SEEN     ABO/Rh- O Pos   IMAGING [place] Ob Comp Less 14 Wks  Result Date: 01/21/2017 CLINICAL DATA:  36 year old pregnant female presents with pelvic pain. EDC by LMP: 09/03/2017, projecting to an expected gestational age of [redacted] weeks 6 days. EXAM: OBSTETRIC <14 WK US AND TRANSVAGINAL OB US TECHNIQUE: Both transabdominal and transvaginal ultrasound examinations were performed for complete evaluation of the gestation as well as the maternal uterus, adnexal regions, and pelvic cul-de-sac. Transvaginal technique was performed to assess early pregnancy. COMPARISON:  No prior scans from this gestation. FINDINGS: Intrauterine gestational sac: Single intrauterine gestational sac appears normal in size, shape and position. Yolk sac:  Visualized. Embryo:  Visualized. Embryonic Cardiac Activity: Regular rate and rhythm. Embryonic Heart Rate: 169  bpm CRL:  16.1  mm   8 w   0 d                  US EDC: 09/02/2017 Subchorionic hemorrhage:  None visualized. Maternal uterus/adnexae: Retroverted uterus. No uterine fibroids. No abnormal ovarian or adnexal masses. Left ovarian corpus luteum. No abnormal free fluid in the pelvis. IMPRESSION: 1. Single living intrauterine gestation at 8 weeks 0 days by crown-rump length, concordant with provided menstrual dating. 2. No acute first-trimester gestational abnormality. Electronically Signed   By: Delbert PhenixJason A Poff M.D.   On: 01/21/2017 16:31   [place] Ob Transvaginal  Result Date:  01/21/2017 CLINICAL DATA:  36 year old pregnant female presents with pelvic pain. EDC by LMP: 09/03/2017, projecting to an expected gestational age of [redacted] weeks 6 days. EXAM: OBSTETRIC <14 WK US AND TRANSVAGINAL OB US TECHNIQUE: Both transabdominal and transvaginal ultrasound examinations were performed for complete evaluation of the gestation as well as the maternal uterus, adnexal regions, and pelvic cul-de-sac. Transvaginal technique was performed to assess early pregnancy. COMPARISON:  No prior scans from this gestation. FINDINGS: Intrauterine gestational sac: Single intrauterine gestational sac appears normal in size, shape and position. Yolk sac:  Visualized. Embryo:  Visualized. Embryonic Cardiac Activity: Regular rate and rhythm. Embryonic Heart Rate: 169  bpm CRL:  16.1  mm   8 w   0 d                  US EDC: 09/02/2017 Subchorionic hemorrhage:  None visualized. Maternal uterus/adnexae: Retroverted uterus. No uterine fibroids. No abnormal ovarian or adnexal masses. Left ovarian corpus luteum. No abnormal free fluid in the pelvis. IMPRESSION: 1. Single living intrauterine  gestation at 8 weeks 0 days by crown-rump length, concordant with provided menstrual dating. 2. No acute first-trimester gestational abnormality. Electronically Signed   By: Delbert Phenix M.D.   On: 01/21/2017 16:31    MAU Management/MDM: Orders Placed This Encounter  Procedures  . Wet prep, genital  . US OB Comp Less 14 Wks  . US OB Transvaginal  . Urinalysis, Routine w reflex microscopic  . Pregnancy, urine POC  Wet prep- negative, normal vaginal discharge  ABO/Rh- O positive- no Rhogam needed   Meds ordered this encounter  Medications  . Prenatal Vit-Fe Fumarate-FA (PREPLUS) 27-1 MG TABS    Sig: Take 1 tablet by mouth daily.    Dispense:  30 tablet    Refill:  13    Order Specific Question:   Supervising Provider    Answer:   Millican Bing [[id number]]   Pt discharged with instructions to starting prenatal care and  making appointment.  ASSESSMENT 1. Normal IUP (intrauterine pregnancy) on prenatal ultrasound, first trimester   2. Bleeding in early pregnancy   3. Abdominal pain during pregnancy in first trimester   4. Vaginal bleeding in pregnancy, first trimester     PLAN Discharge home Rx for Prenatal vitamins  Make appointment for prenatal care and return to MAU as needed for emergencies   Allergies as of 01/21/2017   No Known Allergies     Medication List    TAKE these medications   PREPLUS 27-1 MG Tabs Take 1 tablet by mouth daily.       Steward Drone  Certified Nurse-Midwife 01/21/2017  4:47 PM

## 2017-01-23 LAB — GC/CHLAMYDIA PROBE AMP (~~LOC~~) NOT AT ARMC
Chlamydia: NEGATIVE
Neisseria Gonorrhea: NEGATIVE

## 2017-01-24 NOTE — L&D Delivery Note (Signed)
Patient is 37 y.o. [id number] 7822w6d admitted for SOL. Prenatal course also complicated by A1GDM, AMA, GBS pos.  Delivery Note At 7:15 AM a viable female was delivered via Vaginal, Spontaneous (Presentation:OA ; cephalic ).  APGAR: 9, 9; weight pending .   Placenta status: intact, .  3 vessel Cord:  Head delivered OA. Double nuchal cord present. Reduced after body delivered. Shoulder and body delivered in usual fashion. Infant with spontaneous cry, placed on mother's abdomen, dried and bulb suctioned. Cord clamped x 2 after 1-minute delay, and cut by family member. Cord blood drawn. Placenta delivered spontaneously with gentle cord traction. Fundus firm with massage and Pitocin. Perineum inspected and found to have no lacerations,  Anesthesia:  epidural Episiotomy: None Lacerations: None Est. Blood Loss (mL): 100  Mom to postpartum.  Baby to Couplet care / Skin to Skin.  Sandre KittyDaniel K Salil Raineri 09/02/2017, 7:30 AM

## 2017-02-06 ENCOUNTER — Encounter: Payer: Self-pay | Admitting: Obstetrics & Gynecology

## 2017-02-06 ENCOUNTER — Ambulatory Visit (INDEPENDENT_AMBULATORY_CARE_PROVIDER_SITE_OTHER): Payer: Self-pay | Admitting: Obstetrics & Gynecology

## 2017-02-06 DIAGNOSIS — O09521 Supervision of elderly multigravida, first trimester: Secondary | ICD-10-CM

## 2017-02-06 DIAGNOSIS — Z124 Encounter for screening for malignant neoplasm of cervix: Secondary | ICD-10-CM

## 2017-02-06 DIAGNOSIS — Z113 Encounter for screening for infections with a predominantly sexual mode of transmission: Secondary | ICD-10-CM

## 2017-02-06 DIAGNOSIS — Z23 Encounter for immunization: Secondary | ICD-10-CM

## 2017-02-06 DIAGNOSIS — Z1151 Encounter for screening for human papillomavirus (HPV): Secondary | ICD-10-CM

## 2017-02-06 DIAGNOSIS — O09529 Supervision of elderly multigravida, unspecified trimester: Secondary | ICD-10-CM | POA: Insufficient documentation

## 2017-02-06 DIAGNOSIS — Z3480 Encounter for supervision of other normal pregnancy, unspecified trimester: Secondary | ICD-10-CM

## 2017-02-06 DIAGNOSIS — O0990 Supervision of high risk pregnancy, unspecified, unspecified trimester: Secondary | ICD-10-CM | POA: Insufficient documentation

## 2017-02-06 MED ORDER — FLUCONAZOLE 150 MG PO TABS: 150.0000 mg | ORAL_TABLET | Freq: Once | ORAL | 3 refills | Status: AC

## 2017-02-06 NOTE — Progress Notes (Signed)
  Subjective:    Joanna Whitney is being seen today for her first obstetrical visit.   She is at 231w1d gestation. Her obstetrical history is significant for advanced maternal age and obesity. Relationship with FOB: spouse, living together. Patient may intend to breast feed. Pregnancy history fully reviewed.  Patient reports no complaints.  Review of Systems:   Review of Systems  Objective:     BP 97/62   Pulse 87   Wt 178 lb (80.7 kg)   LMP 11/27/2016   BMI 33.09 kg/m  Physical Exam  Exam    Assessment:    Pregnancy: [id number] Patient Active Problem List   Diagnosis Date Noted  . Supervision of other normal pregnancy, antepartum 02/06/2017  . Advanced maternal age in multigravida 02/06/2017       Plan:     Initial labs drawn. Prenatal vitamins. Problem list reviewed and updated. AFP3 discussed: declined. Role of ultrasound in pregnancy discussed; fetal survey: requested. Amniocentesis discussed: declined. Follow up in 2 weeks. Flu vaccine today [facility]   Allie BossierMyra C Jahrell Hamor 02/06/2017

## 2017-02-06 NOTE — Addendum Note (Signed)
Addended by: Arne ClevelandHUTCHINSON, MANDY J on: 02/06/2017 03:41 PM   Modules accepted: Orders

## 2017-02-07 LAB — PROTEIN / CREATININE RATIO, URINE
CREATININE, UR: 25.3 mg/dL
Protein, Ur: <4 mg/dL

## 2017-02-08 LAB — CULTURE, OB URINE

## 2017-02-08 LAB — CYTOLOGY - PAP
Diagnosis: NEGATIVE
HPV: NOT DETECTED

## 2017-02-08 LAB — URINE CULTURE, OB REFLEX

## 2017-02-08 LAB — GC/CHLAMYDIA PROBE AMP (~~LOC~~) NOT AT ARMC
CHLAMYDIA, DNA PROBE: NEGATIVE
Neisseria Gonorrhea: NEGATIVE

## 2017-02-09 LAB — OBSTETRIC PANEL, INCLUDING HIV
Antibody Screen: NEGATIVE
BASOS ABS: 0 10*3/uL (ref 0.0–0.2)
Basos: 0 %
EOS (ABSOLUTE): 0.3 10*3/uL (ref 0.0–0.4)
Eos: 2 %
HIV Screen 4th Generation wRfx: NONREACTIVE
Hematocrit: 36.7 % (ref 34.0–46.6)
Hemoglobin: 12.3 g/dL (ref 11.1–15.9)
Hepatitis B Surface Ag: NEGATIVE
IMMATURE GRANULOCYTES: 0 %
Immature Grans (Abs): 0 10*3/uL (ref 0.0–0.1)
LYMPHS ABS: 3 10*3/uL (ref 0.7–3.1)
Lymphs: 27 %
MCH: 30.5 pg (ref 26.6–33.0)
MCHC: 33.5 g/dL (ref 31.5–35.7)
MCV: 91 fL (ref 79–97)
Monocytes Absolute: 0.6 10*3/uL (ref 0.1–0.9)
Monocytes: 5 %
NEUTROS PCT: 66 %
Neutrophils Absolute: 7.3 10*3/uL — ABNORMAL HIGH (ref 1.4–7.0)
PLATELETS: 283 10*3/uL (ref 150–379)
RBC: 4.03 x10E6/uL (ref 3.77–5.28)
RDW: 13.8 % (ref 12.3–15.4)
RPR Ser Ql: NONREACTIVE
Rh Factor: POSITIVE
Rubella Antibodies, IGG: 1.62 index (ref >0.99)
WBC: 11.2 10*3/uL — AB (ref 3.4–10.8)

## 2017-02-09 LAB — COMPREHENSIVE METABOLIC PANEL
ALBUMIN: 3.9 g/dL (ref 3.5–5.5)
ALK PHOS: 83 IU/L (ref 39–117)
ALT: 10 IU/L (ref 0–32)
AST: 12 IU/L (ref 0–40)
Albumin/Globulin Ratio: 1.3 (ref 1.2–2.2)
BUN / CREAT RATIO: 23 (ref 9–23)
BUN: 10 mg/dL (ref 6–20)
CHLORIDE: 101 mmol/L (ref 96–106)
CO2: 22 mmol/L (ref 20–29)
Calcium: 9 mg/dL (ref 8.7–10.2)
Creatinine, Ser: 0.43 mg/dL — ABNORMAL LOW (ref 0.57–1.00)
GFR calc Af Amer: 151 mL/min/{1.73_m2} (ref >59)
GFR calc non Af Amer: 131 mL/min/{1.73_m2} (ref >59)
GLUCOSE: 88 mg/dL (ref 65–99)
Globulin, Total: 3 g/dL (ref 1.5–4.5)
POTASSIUM: 3.9 mmol/L (ref 3.5–5.2)
Sodium: 138 mmol/L (ref 134–144)
Total Protein: 6.9 g/dL (ref 6.0–8.5)

## 2017-02-09 LAB — TSH: TSH: 1.27 u[IU]/mL (ref 0.450–4.500)

## 2017-02-09 LAB — HEMOGLOBIN A1C
Est. average glucose Bld gHb Est-mCnc: 111 mg/dL
Hgb A1c MFr Bld: 5.5 % (ref 4.8–5.6)

## 2017-03-09 ENCOUNTER — Ambulatory Visit (INDEPENDENT_AMBULATORY_CARE_PROVIDER_SITE_OTHER): Payer: Self-pay | Admitting: Obstetrics and Gynecology

## 2017-03-09 DIAGNOSIS — O99210 Obesity complicating pregnancy, unspecified trimester: Secondary | ICD-10-CM

## 2017-03-09 DIAGNOSIS — E669 Obesity, unspecified: Secondary | ICD-10-CM | POA: Insufficient documentation

## 2017-03-09 DIAGNOSIS — Z789 Other specified health status: Secondary | ICD-10-CM

## 2017-03-09 DIAGNOSIS — O09522 Supervision of elderly multigravida, second trimester: Secondary | ICD-10-CM

## 2017-03-09 DIAGNOSIS — O99212 Obesity complicating pregnancy, second trimester: Secondary | ICD-10-CM

## 2017-03-09 MED ORDER — ASPIRIN EC 81 MG PO TBEC: 81.0000 mg | DELAYED_RELEASE_TABLET | Freq: Every day | ORAL | 3 refills | Status: DC

## 2017-03-09 NOTE — Progress Notes (Signed)
Prenatal Visit Note Date: 03/09/2017 Clinic: [facility]  Subjective:  Joanna Whitney is a 37 y.o. [id number] at 7534w4d being seen today for ongoing prenatal care.  She is currently monitored for the following issues for this low-risk pregnancy and has Supervision of other normal pregnancy, antepartum; Advanced maternal age in multigravida; Obesity (BMI 30-39.9); Obesity in pregnancy; and Language barrier on their problem list.  Patient reports no complaints.   Contractions: Not present. Vag. Bleeding: None.  Movement: Absent. Denies leaking of fluid.   The following portions of the patient's history were reviewed and updated as appropriate: allergies, current medications, past family history, past medical history, past social history, past surgical history and problem list. Problem list updated.  Objective:   Vitals:   03/09/17 0855  BP: 139/76  Pulse: 96  Weight: 186 lb 3.2 oz (84.5 kg)    Fetal Status: Fetal Heart Rate (bpm): 152   Movement: Absent     General:  Alert, oriented and cooperative. Patient is in no acute distress.  Skin: Skin is warm and dry. No rash noted.   Cardiovascular: Normal heart rate noted  Respiratory: Normal respiratory effort, no problems with respiration noted  Abdomen: Soft, gravid, appropriate for gestational age. Pain/Pressure: Absent     Pelvic:  Cervical exam deferred        Extremities: Normal range of motion.  Edema: None  Mental Status: Normal mood and affect. Normal behavior. Normal judgment and thought content.   Urinalysis:      Assessment and Plan:  Pregnancy: G4P3003 at 3534w4d  1. Obesity (BMI 30-39.9)  2. Obesity in pregnancy Negative early a1c, pre-x labs  3. Elderly multigravida in second trimester Declined any testing. F/u anatomy u/s to be done at [facility]  Interpreter used  Preterm labor symptoms and general obstetric precautions including but not limited to vaginal bleeding, contractions, leaking of  fluid and fetal movement were reviewed in detail with the patient. Please refer to After Visit Summary for other counseling recommendations.  Return in about 4 weeks (around 04/06/2017) for rob.   [place] BingPickens, [name], MD

## 2017-04-06 ENCOUNTER — Encounter: Payer: Self-pay | Admitting: Family Medicine

## 2017-04-12 ENCOUNTER — Encounter: Payer: Self-pay | Admitting: Radiology

## 2017-04-13 ENCOUNTER — Telehealth: Payer: Self-pay | Admitting: Radiology

## 2017-04-13 ENCOUNTER — Encounter: Payer: Self-pay | Admitting: Obstetrics and Gynecology

## 2017-04-13 ENCOUNTER — Ambulatory Visit (INDEPENDENT_AMBULATORY_CARE_PROVIDER_SITE_OTHER): Payer: Self-pay | Admitting: Obstetrics and Gynecology

## 2017-04-13 VITALS — BP 109/67 | HR 87 | Wt 192.0 lb

## 2017-04-13 DIAGNOSIS — O09523 Supervision of elderly multigravida, third trimester: Secondary | ICD-10-CM

## 2017-04-13 DIAGNOSIS — Z789 Other specified health status: Secondary | ICD-10-CM

## 2017-04-13 DIAGNOSIS — Z3480 Encounter for supervision of other normal pregnancy, unspecified trimester: Secondary | ICD-10-CM

## 2017-04-13 DIAGNOSIS — E669 Obesity, unspecified: Secondary | ICD-10-CM

## 2017-04-13 DIAGNOSIS — O99210 Obesity complicating pregnancy, unspecified trimester: Secondary | ICD-10-CM

## 2017-04-13 DIAGNOSIS — Z603 Acculturation difficulty: Secondary | ICD-10-CM

## 2017-04-13 NOTE — Progress Notes (Signed)
Prenatal Visit Note Date: 04/13/2017 Clinic: [facility]  Subjective:  Joanna Whitney is a 37 y.o. [id number] at 4072w4d being seen today for ongoing prenatal care.  She is currently monitored for the following issues for this high-risk pregnancy and has Supervision of other normal pregnancy, antepartum; Advanced maternal age in multigravida; Obesity (BMI 30-39.9); Obesity in pregnancy; and Language barrier on their problem list.  Patient reports no complaints.   Contractions: Not present. Vag. Bleeding: None.  Movement: Present. Denies leaking of fluid.   The following portions of the patient's history were reviewed and updated as appropriate: allergies, current medications, past family history, past medical history, past social history, past surgical history and problem list. Problem list updated.  Objective:   Vitals:   04/13/17 0948  BP: 109/67  Pulse: 87  Weight: 192 lb (87.1 kg)    Fetal Status: Fetal Heart Rate (bpm): 149   Movement: Present     General:  Alert, oriented and cooperative. Patient is in no acute distress.  Skin: Skin is warm and dry. No rash noted.   Cardiovascular: Normal heart rate noted  Respiratory: Normal respiratory effort, no problems with respiration noted  Abdomen: Soft, gravid, appropriate for gestational age. Pain/Pressure: Absent     Pelvic:  Cervical exam deferred        Extremities: Normal range of motion.  Edema: None  Mental Status: Normal mood and affect. Normal behavior. Normal judgment and thought content.   Urinalysis:      Assessment and Plan:  Pregnancy: G4P3003 at 5872w4d  1. Supervision of other normal pregnancy, antepartum Routine care. [place] anatomy u/s negative. Continue low dose asa  2. Language barrier Interpreter used  3. Obesity in pregnancy No issues  4. Elderly multigravida in third trimester Declines any testing  5. Obesity (BMI 30-39.9)   Preterm labor symptoms and general obstetric  precautions including but not limited to vaginal bleeding, contractions, leaking of fluid and fetal movement were reviewed in detail with the patient. Please refer to After Visit Summary for other counseling recommendations.  Return in about 1 month (around 05/11/2017).   [place] BingPickens, Trinity Hyland, MD

## 2017-04-13 NOTE — Telephone Encounter (Signed)
Call and spoke with husband, informed him to let patient know that [facility] @ 11:00. He explained that he understood and would give the patient the message

## 2017-05-11 ENCOUNTER — Ambulatory Visit (INDEPENDENT_AMBULATORY_CARE_PROVIDER_SITE_OTHER): Payer: Self-pay | Admitting: Family Medicine

## 2017-05-11 VITALS — BP 104/64 | HR 95 | Wt 196.0 lb

## 2017-05-11 DIAGNOSIS — Z3480 Encounter for supervision of other normal pregnancy, unspecified trimester: Secondary | ICD-10-CM

## 2017-05-11 DIAGNOSIS — Z3482 Encounter for supervision of other normal pregnancy, second trimester: Secondary | ICD-10-CM

## 2017-05-11 DIAGNOSIS — R300 Dysuria: Secondary | ICD-10-CM

## 2017-05-11 DIAGNOSIS — B373 Candidiasis of vulva and vagina: Secondary | ICD-10-CM

## 2017-05-11 DIAGNOSIS — O09522 Supervision of elderly multigravida, second trimester: Secondary | ICD-10-CM

## 2017-05-11 DIAGNOSIS — B3731 Acute candidiasis of vulva and vagina: Secondary | ICD-10-CM

## 2017-05-11 LAB — POCT URINE QUALITATIVE DIPSTICK BLOOD: Blood, UA: NEGATIVE

## 2017-05-11 MED ORDER — TERCONAZOLE 0.8 % VA CREA: 1.0000 | TOPICAL_CREAM | Freq: Every day | VAGINAL | 0 refills | Status: DC

## 2017-05-11 NOTE — Progress Notes (Addendum)
   PRENATAL VISIT NOTE Spanish interpreter: Noelia used  Subjective:  Joanna Whitney is a 37 y.o. [phone] at 6146w4d being seen today for ongoing prenatal care.  She is currently monitored for the following issues for this low-risk pregnancy and has Supervision of other normal pregnancy, antepartum; Advanced maternal age in multigravida; Obesity (BMI 30-39.9); Obesity in pregnancy; and Language barrier on their problem list.  Patient reports vaginal irritation and pelvic pressure with lying down and burning with urination.  Contractions: Not present.  .  Movement: Present. Denies leaking of fluid.   The following portions of the patient's history were reviewed and updated as appropriate: allergies, current medications, past family history, past medical history, past social history, past surgical history and problem list. Problem list updated.  Objective:   Vitals:   05/11/17 0933  BP: 104/64  Pulse: 95  Weight: 196 lb (88.9 kg)    Fetal Status: Fetal Heart Rate (bpm): 144 Fundal Height: 23 cm Movement: Present     General:  Alert, oriented and cooperative. Patient is in no acute distress.  Skin: Skin is warm and dry. No rash noted.   Cardiovascular: Normal heart rate noted  Respiratory: Normal respiratory effort, no problems with respiration noted  Abdomen: Soft, gravid, appropriate for gestational age.  Pain/Pressure: Absent     Pelvic: Cervical exam performed Dilation: Closed Effacement (%): Thick Station: Ballotable  Extremities: Normal range of motion.  Edema: None  Mental Status: Normal mood and affect. Normal behavior. Normal judgment and thought content.   Assessment and Plan:  Pregnancy: G4P3003 at 8546w4d  1. Supervision of other normal pregnancy, antepartum 28 wk labs next visit To [facility] for TDaP--letter given to pt  2. Multigravida of advanced maternal age in second trimester Declined genetics  3. Dysuria + Leuks on urine dip-->check culture - POCT urine qual  dipstick blood - Urine Culture  4. Yeast vaginitis Treat yeast presumptively. - terconazole (TERAZOL 3) 0.8 % vaginal cream; Place 1 applicator vaginally at bedtime.  Dispense: 20 g; Refill: 0  Preterm labor symptoms and general obstetric precautions including but not limited to vaginal bleeding, contractions, leaking of fluid and fetal movement were reviewed in detail with the patient. Please refer to After Visit Summary for other counseling recommendations.  Return in 1 month (on 06/08/2017) for 28 wk labs.  Future Appointments  Date Time Provider Department Center  06/08/2017  8:30 AM [place] BingPickens, Charlie, MD [facility]    Reva Boresanya S Kashis Penley, MD

## 2017-05-11 NOTE — Patient Instructions (Signed)
 Lactancia materna Breastfeeding Decidir amamantar es una de las mejores elecciones que puede hacer por usted y su beb. Un cambio en las hormonas durante el embarazo hace que las mamas produzcan leche materna en las glndulas productoras de leche. Las hormonas impiden que la leche materna sea liberada antes del nacimiento del beb. Adems, impulsan el flujo de leche luego del nacimiento. Una vez que ha comenzado a amamantar, pensar en el beb, as como la succin o el llanto, pueden estimular la liberacin de leche de las glndulas productoras de leche. Los beneficios de amamantar Las investigaciones demuestran que la lactancia materna ofrece muchos beneficios de salud para bebs y madres. Adems, ofrece una forma gratuita y conveniente de alimentar al beb. Para el beb  La primera leche (calostro) ayuda a mejorar el funcionamiento del aparato digestivo del beb.  Las clulas especiales de la leche (anticuerpos) ayudan a combatir las infecciones en el beb.  Los bebs que se alimentan con leche materna tambin tienen menos probabilidades de tener asma, alergias, obesidad o diabetes de tipo 2. Adems, tienen menor riesgo de sufrir el sndrome de muerte sbita del lactante (SMSL).  Los nutrientes de la leche materna son mejores para satisfacer las necesidades del beb en comparacin con la leche maternizada.  La leche materna mejora el desarrollo cerebral del beb. Para usted  La lactancia materna favorece el desarrollo de un vnculo muy especial entre la madre y el beb.  Es conveniente. La leche materna es econmica y siempre est disponible a la temperatura correcta.  La lactancia materna ayuda a quemar caloras. Le ayuda a perder el peso ganado durante el embarazo.  Hace que el tero vuelva al tamao que tena antes del embarazo ms rpido. Adems, disminuye el sangrado (loquios) despus del parto.  La lactancia materna contribuye a reducir el riesgo de tener diabetes de tipo 2,  osteoporosis, artritis reumatoide, enfermedades cardiovasculares y cncer de mama, ovario, tero y endometrio en el futuro. Informacin bsica sobre la lactancia Comienzo de la lactancia  Encuentre un lugar cmodo para sentarse o acostarse, con un buen respaldo para el cuello y la espalda.  Coloque una almohada o una manta enrollada debajo del beb para acomodarlo a la altura de la mama (si est sentada). Las almohadas para amamantar se han diseado especialmente a fin de servir de apoyo para los brazos y el beb mientras amamanta.  Asegrese de que la barriga del beb (abdomen) est frente a la suya.  Masajee suavemente la mama. Con las yemas de los dedos, masajee los bordes exteriores de la mama hacia adentro, en direccin al pezn. Esto estimula el flujo de leche. Si la leche fluye lentamente, es posible que deba continuar con este movimiento durante la lactancia.  Sostenga la mama con 4 dedos por debajo y el pulgar por arriba del pezn (forme la letra "C" con la mano). Asegrese de que los dedos se encuentren lejos del pezn y de la boca del beb.  Empuje suavemente los labios del beb con el pezn o con el dedo.  Cuando la boca del beb se abra lo suficiente, acrquelo rpidamente a la mama e introduzca todo el pezn y la arola, tanto como sea posible, dentro de la boca del beb. La arola es la zona de color que rodea al pezn. ? Debe haber ms arola visible por arriba del labio superior del beb que por debajo del labio inferior. ? Los labios del beb deben estar abiertos y extendidos hacia afuera (evertidos) para asegurar que   el beb se prenda de forma adecuada y cmoda. ? La lengua del beb debe estar entre la enca inferior y la mama.  Asegrese de que la boca del beb est en la posicin correcta alrededor del pezn (prendido). Los labios del beb deben crear un sello sobre la mama y estar doblados hacia afuera (invertidos).  Es comn que el beb succione durante 2 a 3 minutos  para que comience el flujo de leche materna. Cmo debe prenderse Es muy importante que le ensee al beb cmo prenderse adecuadamente a la mama. Si el beb no se prende adecuadamente, puede causar dolor en los pezones, reducir la produccin de leche materna y hacer que el beb tenga un escaso aumento de peso. Adems, si el beb no se prende adecuadamente al pezn, puede tragar aire durante la alimentacin. Esto puede causarle molestias al beb. Hacer eructar al beb al cambiar de mama puede ayudarlo a liberar el aire. Sin embargo, ensearle al beb cmo prenderse a la mama adecuadamente es la mejor manera de evitar que se sienta molesto por tragar aire mientras se alimenta. Signos de que el beb se ha prendido adecuadamente al pezn  Tironea o succiona de modo silencioso, sin causarle dolor. Los labios del beb deben estar extendidos hacia afuera (evertidos).  Se escucha que traga cada 3 o 4 succiones una vez que la leche ha comenzado a fluir (despus de que se produzca el reflejo de eyeccin de la leche).  Hay movimientos musculares por arriba y por delante de sus odos al succionar.  Signos de que el beb no se ha prendido adecuadamente al pezn  Hace ruidos de succin o de chasquido mientras se alimenta.  Siente dolor en los pezones.  Si cree que el beb no se prendi correctamente, deslice el dedo en la comisura de la boca y colquelo entre las encas del beb para interrumpir la succin. Intente volver a comenzar a amamantar. Signos de lactancia materna exitosa Signos del beb  El beb disminuir gradualmente el nmero de succiones o dejar de succionar por completo.  El beb se quedar dormido.  El cuerpo del beb se relajar.  El beb retendr una pequea cantidad de leche en la boca.  El beb se desprender solo del pecho.  Signos que presenta usted  Las mamas han aumentado la firmeza, el peso y el tamao 1 a 3 horas despus de amamantar.  Estn ms blandas inmediatamente  despus de amamantar.  Se producen un aumento del volumen de leche y un cambio en su consistencia y color hacia el quinto da de lactancia.  Los pezones no duelen, no estn agrietados ni sangran.  Signos de que su beb recibe la cantidad de leche suficiente  Mojar por lo menos 1 o 2paales durante las primeras 24horas despus del nacimiento.  Mojar por lo menos 5 o 6paales cada 24horas durante la primera semana despus del nacimiento. La orina debe ser clara o de color amarillo plido a los 5das de vida.  Mojar entre 6 y 8paales cada 24horas a medida que el beb sigue creciendo y desarrollndose.  Defeca por lo menos 3 veces en 24 horas a los 5 das de vida. Las heces deben ser blandas y amarillentas.  Defeca por lo menos 3 veces en 24 horas a los 7 das de vida. Las heces deben ser grumosas y amarillentas.  No registra una prdida de peso mayor al 10% del peso al nacer durante los primeros 3 das de vida.  Aumenta de peso un   promedio de 4 a 7onzas (113 a 198g) por semana despus de los 4 das de vida.  Aumenta de peso, diariamente, de manera uniforme a partir de los 5 das de vida, sin registrar prdida de peso despus de las 2semanas de vida. Despus de alimentarse, es posible que el beb regurgite una pequea cantidad de leche. Esto es normal. Frecuencia y duracin de la lactancia El amamantamiento frecuente la ayudar a producir ms leche y puede prevenir dolores en los pezones y las mamas extremadamente llenas (congestin mamaria). Alimente al beb cuando muestre signos de hambre o si siente la necesidad de reducir la congestin de las mamas. Esto se denomina "lactancia a demanda". Las seales de que el beb tiene hambre incluyen las siguientes:  Aumento del estado de alerta, actividad o inquietud.  Mueve la cabeza de un lado a otro.  Abre la boca cuando se le toca la mejilla o la comisura de la boca (reflejo de bsqueda).  Aumenta las vocalizaciones, tales como  sonidos de succin, se relame los labios, emite arrullos, suspiros o chirridos.  Mueve la mano hacia la boca y se chupa los dedos o las manos.  Est molesto o llora.  Evite el uso del chupete en las primeras 4 a 6 semanas despus del nacimiento del beb. Despus de este perodo, podr usar un chupete. Las investigaciones demostraron que el uso del chupete durante el primer ao de vida del beb disminuye el riesgo de tener el sndrome de muerte sbita del lactante (SMSL). Permita que el nio se alimente en cada mama todo lo que desee. Cuando el beb se desprende o se queda dormido mientras se est alimentando de la primera mama, ofrzcale la segunda. Debido a que, con frecuencia, los recin nacidos estn somnolientos las primeras semanas de vida, es posible que deba despertar al beb para alimentarlo. Los horarios de lactancia varan de un beb a otro. Sin embargo, las siguientes reglas pueden servir como gua para ayudarla a garantizar que el beb se alimenta adecuadamente:  Se puede amamantar a los recin nacidos (bebs de 4 semanas o menos de vida) cada 1 a 3 horas.  No deben transcurrir ms de 3 horas durante el da o 5 horas durante la noche sin que se amamante a los recin nacidos.  Debe amamantar al beb un mnimo de 8 veces en un perodo de 24 horas.  Extraccin de leche materna La extraccin y el almacenamiento de la leche materna le permiten asegurarse de que el beb se alimente exclusivamente de su leche materna, aun en momentos en los que no puede amamantar. Esto tiene especial importancia si debe regresar al trabajo en el perodo en que an est amamantando o si no puede estar presente en los momentos en que el beb debe alimentarse. Su asesor en lactancia puede ayudarla a encontrar un mtodo de extraccin que funcione mejor para usted y orientarla sobre cunto tiempo es seguro almacenar leche materna. Cmo cuidar las mamas durante la lactancia Los pezones pueden secarse, agrietarse y  doler durante la lactancia. Las siguientes recomendaciones pueden ayudarla a mantener las mamas humectadas y sanas:  Evite usar jabn en los pezones.  Use un sostn de soporte diseado especialmente para la lactancia materna. Evite usar sostenes con aro o sostenes muy ajustados (sostenes deportivos).  Seque al aire sus pezones durante 3 a 4minutos despus de amamantar al beb.  Utilice solo apsitos de algodn en el sostn para absorber las prdidas de leche. La prdida de un poco de leche materna   entre las tomas es normal.  Utilice lanolina sobre los pezones luego de amamantar. La lanolina ayuda a mantener la humedad normal de la piel. La lanolina pura no es perjudicial (no es txica) para el beb. Adems, puede extraer manualmente algunas gotas de leche materna y masajear suavemente esa leche sobre los pezones para que la leche se seque al aire.  Durante las primeras semanas despus del nacimiento, algunas mujeres experimentan congestin mamaria. La congestin mamaria puede hacer que sienta las mamas pesadas, calientes y sensibles al tacto. El pico de la congestin mamaria ocurre en el plazo de los 3 a 5 das despus del parto. Las siguientes recomendaciones pueden ayudarla a aliviar la congestin mamaria:  Vace por completo las mamas al amamantar o extraer leche. Puede aplicar calor hmedo en las mamas (en la ducha o con toallas hmedas para manos) antes de amamantar o extraer leche. Esto aumenta la circulacin y ayuda a que la leche fluya. Si el beb no vaca por completo las mamas cuando lo amamanta, extraiga la leche restante despus de que haya finalizado.  Aplique compresas de hielo sobre las mamas inmediatamente despus de amamantar o extraer leche, a menos que le resulte demasiado incmodo. Haga lo siguiente: ? Ponga el hielo en una bolsa plstica. ? Coloque una toalla entre la piel y la bolsa de hielo. ? Coloque el hielo durante 20minutos, 2 o 3veces por da.  Asegrese de que el  beb est prendido y se encuentre en la posicin correcta mientras lo alimenta.  Si la congestin mamaria persiste luego de 48 horas o despus de seguir estas recomendaciones, comunquese con su mdico o un asesor en lactancia. Recomendaciones de salud general durante la lactancia  Consuma 3 comidas y 3 colaciones saludables todos los das. Las madres bien alimentadas que amamantan necesitan entre 450 y 500 caloras adicionales por da. Puede cumplir con este requisito al aumentar la cantidad de una dieta equilibrada que realice.  Beba suficiente agua para mantener la orina clara o de color amarillo plido.  Descanse con frecuencia, reljese y siga tomando sus vitaminas prenatales para prevenir la fatiga, el estrs y los niveles bajos de vitaminas y minerales en el cuerpo (deficiencias de nutrientes).  No consuma ningn producto que contenga nicotina o tabaco, como cigarrillos y cigarrillos electrnicos. El beb puede verse afectado por las sustancias qumicas de los cigarrillos que pasan a la leche materna y por la exposicin al humo ambiental del tabaco. Si necesita ayuda para dejar de fumar, consulte al mdico.  Evite el consumo de alcohol.  No consuma drogas ilegales o marihuana.  Antes de usar cualquier medicamento, hable con el mdico. Estos incluyen medicamentos recetados y de venta libre, como tambin vitaminas y suplementos a base de hierbas. Algunos medicamentos, que pueden ser perjudiciales para el beb, pueden pasar a travs de la leche materna.  Puede quedar embarazada durante la lactancia. Si se desea un mtodo anticonceptivo, consulte al mdico sobre cules son las opciones seguras durante la lactancia. Dnde encontrar ms informacin: Liga internacional La Leche: www.llli.org. Comunquese con un mdico si:  Siente que quiere dejar de amamantar o se siente frustrada con la lactancia.  Sus pezones estn agrietados o sangran.  Sus mamas estn irritadas, sensibles o  calientes.  Tiene los siguientes sntomas: ? Dolor en las mamas o en los pezones. ? Un rea hinchada en cualquiera de las mamas. ? Fiebre o escalofros. ? Nuseas o vmitos. ? Drenaje de otro lquido distinto de la leche materna desde los pezones.    Sus mamas no se llenan antes de amamantar al beb para el quinto da despus del parto.  Se siente triste y deprimida.  El beb: ? Est demasiado somnoliento como para comer bien. ? Tiene problemas para dormir. ? Tiene ms de 1 semana de vida y moja menos de 6 paales en un periodo de 24 horas. ? No ha aumentado de peso a los 5 das de vida.  El beb defeca menos de 3 veces en 24 horas.  La piel del beb o las partes blancas de los ojos se vuelven amarillentas. Solicite ayuda de inmediato si:  El beb est muy cansado (letargo) y no se quiere despertar para comer.  Le sube la fiebre sin causa. Resumen  La lactancia materna ofrece muchos beneficios de salud para bebs y madres.  Intente amamantar a su beb cuando muestre signos tempranos de hambre.  Haga cosquillas o empuje suavemente los labios del beb con el dedo o el pezn para lograr que el beb abra la boca. Acerque el beb a la mama. Asegrese de que la mayor parte de la arola se encuentre dentro de la boca del beb. Ofrzcale una mama y haga eructar al beb antes de pasar a la otra.  Hable con su mdico o asesor en lactancia si tiene dudas o problemas con la lactancia. Esta informacin no tiene como fin reemplazar el consejo del mdico. Asegrese de hacerle al mdico cualquier pregunta que tenga. Document Released: 01/10/2005 Document Revised: 05/02/2016 Document Reviewed: 05/02/2016 Elsevier Interactive Patient Education  2018 [employer].  

## 2017-05-11 NOTE — Progress Notes (Signed)
Urine check

## 2017-05-12 LAB — URINE CULTURE

## 2017-06-08 ENCOUNTER — Ambulatory Visit (INDEPENDENT_AMBULATORY_CARE_PROVIDER_SITE_OTHER): Payer: Self-pay | Admitting: Obstetrics and Gynecology

## 2017-06-08 VITALS — BP 100/65 | HR 82 | Wt 197.0 lb

## 2017-06-08 DIAGNOSIS — O09522 Supervision of elderly multigravida, second trimester: Secondary | ICD-10-CM

## 2017-06-08 DIAGNOSIS — Z3480 Encounter for supervision of other normal pregnancy, unspecified trimester: Secondary | ICD-10-CM

## 2017-06-08 DIAGNOSIS — O99210 Obesity complicating pregnancy, unspecified trimester: Secondary | ICD-10-CM

## 2017-06-08 DIAGNOSIS — O99212 Obesity complicating pregnancy, second trimester: Secondary | ICD-10-CM

## 2017-06-08 DIAGNOSIS — E669 Obesity, unspecified: Secondary | ICD-10-CM

## 2017-06-08 DIAGNOSIS — Z3482 Encounter for supervision of other normal pregnancy, second trimester: Secondary | ICD-10-CM

## 2017-06-08 DIAGNOSIS — Z789 Other specified health status: Secondary | ICD-10-CM

## 2017-06-08 NOTE — Progress Notes (Signed)
Prenatal Visit Note Date: 06/08/2017 Clinic: [facility]  Subjective:  Joanna Whitney is a 37 y.o. [phone] at [redacted]w[redacted]d being seen today for ongoing prenatal care.  She is currently monitored for the following issues for this low-risk pregnancy and has Supervision of other normal pregnancy, antepartum; Advanced maternal age in multigravida; Obesity (BMI 30-39.9); Obesity in pregnancy; and Language barrier on their problem list.  Patient reports no complaints.   Contractions: Not present. Vag. Bleeding: None.  Movement: Present. Denies leaking of fluid.   The following portions of the patient's history were reviewed and updated as appropriate: allergies, current medications, past family history, past medical history, past social history, past surgical history and problem list. Problem list updated.  Objective:   Vitals:   06/08/17 0814  BP: 100/65  Pulse: 82  Weight: 197 lb (89.4 kg)    Fetal Status: Fetal Heart Rate (bpm): 147 Fundal Height: 29 cm Movement: Present     General:  Alert, oriented and cooperative. Patient is in no acute distress.  Skin: Skin is warm and dry. No rash noted.   Cardiovascular: Normal heart rate noted  Respiratory: Normal respiratory effort, no problems with respiration noted  Abdomen: Soft, gravid, appropriate for gestational age. Pain/Pressure: Absent     Pelvic:  Cervical exam deferred        Extremities: Normal range of motion.  Edema: None  Mental Status: Normal mood and affect. Normal behavior. Normal judgment and thought content.   Urinalysis:      Assessment and Plan:  Pregnancy: G4P3003 at [redacted]w[redacted]d  1. Supervision of other normal pregnancy, antepartum Routine care - Glucose Tolerance, 2 Hours w/1 Hour - CBC - RPR - HIV antibody  2. Multigravida of advanced maternal age in second trimester No issues  3. Obesity (BMI 30-39.9) D/w pt and try to keep under 205lbs  4. Obesity in pregnancy  5. Language  barrier Interpreter used.   Preterm labor symptoms and general obstetric precautions including but not limited to vaginal bleeding, contractions, leaking of fluid and fetal movement were reviewed in detail with the patient. Please refer to After Visit Summary for other counseling recommendations.  Return in about 2 weeks (around 06/22/2017) for 2-3wk rob.   [place] Bing, MD

## 2017-06-08 NOTE — Progress Notes (Signed)
Tdap completed at the health department

## 2017-06-09 LAB — CBC
HEMATOCRIT: 34.8 % (ref 34.0–46.6)
HEMOGLOBIN: 11.1 g/dL (ref 11.1–15.9)
MCH: 30 pg (ref 26.6–33.0)
MCHC: 31.9 g/dL (ref 31.5–35.7)
MCV: 94 fL (ref 79–97)
Platelets: 267 10*3/uL (ref 150–379)
RBC: 3.7 x10E6/uL — ABNORMAL LOW (ref 3.77–5.28)
RDW: 14.9 % (ref 12.3–15.4)
WBC: 9.6 10*3/uL (ref 3.4–10.8)

## 2017-06-09 LAB — RPR: RPR Ser Ql: NONREACTIVE

## 2017-06-09 LAB — GLUCOSE TOLERANCE, 2 HOURS W/ 1HR
GLUCOSE, 1 HOUR: 188 mg/dL — AB (ref 65–179)
Glucose, 2 hour: 163 mg/dL — ABNORMAL HIGH (ref 65–152)
Glucose, Fasting: 86 mg/dL (ref 65–91)

## 2017-06-09 LAB — HIV ANTIBODY (ROUTINE TESTING W REFLEX): HIV Screen 4th Generation wRfx: NONREACTIVE

## 2017-06-13 ENCOUNTER — Encounter: Payer: Self-pay | Admitting: Obstetrics and Gynecology

## 2017-06-13 ENCOUNTER — Telehealth: Payer: Self-pay | Admitting: *Deleted

## 2017-06-13 DIAGNOSIS — Z8632 Personal history of gestational diabetes: Secondary | ICD-10-CM | POA: Insufficient documentation

## 2017-06-13 DIAGNOSIS — O24415 Gestational diabetes mellitus in pregnancy, controlled by oral hypoglycemic drugs: Secondary | ICD-10-CM

## 2017-06-13 MED ORDER — ACCU-CHEK FASTCLIX LANCETS MISC: 1.0000 [IU] | Freq: Four times a day (QID) | 12 refills | Status: DC

## 2017-06-13 MED ORDER — GLUCOSE BLOOD VI STRP: ORAL_STRIP | 12 refills | Status: DC

## 2017-06-13 MED ORDER — ACCU-CHEK NANO SMARTVIEW W/DEVICE KIT: 1.0000 | PACK | 0 refills | Status: DC

## 2017-06-13 NOTE — Telephone Encounter (Signed)
Enrolled pt into [facility] glucose program

## 2017-06-13 NOTE — Telephone Encounter (Signed)
-----   Message from [place] Bing, MD sent at 06/13/2017  9:20 AM EDT ----- Please let her know that she has gdm and to set her up per protocol. thanks

## 2017-06-21 ENCOUNTER — Encounter: Payer: Self-pay | Attending: Obstetrics & Gynecology | Admitting: *Deleted

## 2017-06-21 DIAGNOSIS — O24415 Gestational diabetes mellitus in pregnancy, controlled by oral hypoglycemic drugs: Secondary | ICD-10-CM | POA: Insufficient documentation

## 2017-06-21 DIAGNOSIS — O24410 Gestational diabetes mellitus in pregnancy, diet controlled: Secondary | ICD-10-CM

## 2017-06-21 DIAGNOSIS — Z713 Dietary counseling and surveillance: Secondary | ICD-10-CM | POA: Insufficient documentation

## 2017-06-21 NOTE — Progress Notes (Signed)
  Patient was seen on 06/21/2017 for Gestational Diabetes self-management.  Patient speaks Spanish, live interpretor here today. EDD 09/03/2017. Patient states no history of GDM. Diet history obtained. Patient eats very good variety of all food groups and beverages include water and vegetable smoothie.  The following learning objectives were met by the patient :   States the definition of Gestational Diabetes  States why dietary management is important in controlling blood glucose  Describes the effects of carbohydrates on blood glucose levels  Demonstrates ability to create a balanced meal plan  Demonstrates carbohydrate counting   States when to check blood glucose levels  Demonstrates proper blood glucose monitoring techniques  States the effect of stress and exercise on blood glucose levels  States the importance of limiting caffeine and abstaining from alcohol and smoking  Plan:  Aim for 3 Carb Choices per meal (45 grams) +/- 1 either way  Aim for 1-2 Carbs per snack Begin reading food labels for Total Carbohydrate of foods Consider  increasing your activity level by walking or other activity daily as tolerated Begin checking BG before breakfast and 2 hours after first bite of breakfast, lunch and dinner as directed by MD  Bring Log Book/Sheet to every medical appointment   Patient not appropriate for [facility] due to language barrier  Take medication if directed by MD  OB Md Rx'd Accu Chek meter and supplies which patient picked up from pharmacy and paid cash for. Free meter is available to her as she does not have any medical insurance. I called the [facility] and they agreed over phone that she could return unopened meter and supplies to the pharmacy for a refund. Patient instructed to come to [facility] tomorrow to pick up True Track Meter and supplies.   Blood glucose monitor to be  Given tomorrow: True Track Blood glucose reading today when taught how to use this  meter: 85 mg/dl  Patient instructed to monitor glucose levels: FBS: 60 - 95 mg/dl 2 hour: <120 mg/dl  Patient received the following handouts: in Spanish  Nutrition Diabetes and Pregnancy  Carbohydrate Counting List  BG Log Sheet  Patient will be seen for follow-up as needed.

## 2017-06-23 ENCOUNTER — Encounter: Payer: Self-pay | Admitting: Obstetrics & Gynecology

## 2017-06-23 ENCOUNTER — Ambulatory Visit (INDEPENDENT_AMBULATORY_CARE_PROVIDER_SITE_OTHER): Payer: Self-pay | Admitting: Obstetrics & Gynecology

## 2017-06-23 VITALS — BP 105/67 | HR 84 | Wt 193.0 lb

## 2017-06-23 DIAGNOSIS — O99210 Obesity complicating pregnancy, unspecified trimester: Secondary | ICD-10-CM

## 2017-06-23 DIAGNOSIS — O24410 Gestational diabetes mellitus in pregnancy, diet controlled: Secondary | ICD-10-CM

## 2017-06-23 DIAGNOSIS — Z3480 Encounter for supervision of other normal pregnancy, unspecified trimester: Secondary | ICD-10-CM

## 2017-06-23 DIAGNOSIS — Z789 Other specified health status: Secondary | ICD-10-CM

## 2017-06-23 DIAGNOSIS — O09523 Supervision of elderly multigravida, third trimester: Secondary | ICD-10-CM

## 2017-06-23 NOTE — Progress Notes (Signed)
   PRENATAL VISIT NOTE  Subjective:  Joanna Whitney is a 37 y.o. [phone] at [redacted]w[redacted]d being seen today for ongoing prenatal care.  She is currently monitored for the following issues for this high-risk pregnancy and has Supervision of other normal pregnancy, antepartum; Advanced maternal age in multigravida; Obesity (BMI 30-39.9); Obesity in pregnancy; Language barrier; and GDM (gestational diabetes mellitus) on their problem list.  Patient reports no complaints.  Contractions: Not present. Vag. Bleeding: None.  Movement: Present. Denies leaking of fluid.   The following portions of the patient's history were reviewed and updated as appropriate: allergies, current medications, past family history, past medical history, past social history, past surgical history and problem list. Problem list updated.  Objective:   Vitals:   06/23/17 1130  BP: 105/67  Pulse: 84  Weight: 193 lb (87.5 kg)    Fetal Status: Fetal Heart Rate (bpm): 143   Movement: Present     General:  Alert, oriented and cooperative. Patient is in no acute distress.  Skin: Skin is warm and dry. No rash noted.   Cardiovascular: Normal heart rate noted  Respiratory: Normal respiratory effort, no problems with respiration noted  Abdomen: Soft, gravid, appropriate for gestational age.  Pain/Pressure: Absent     Pelvic: Cervical exam deferred        Extremities: Normal range of motion.  Edema: None  Mental Status: Normal mood and affect. Normal behavior. Normal judgment and thought content.   Assessment and Plan:  Pregnancy: G4P3003 at [redacted]w[redacted]d  1. Multigravida of advanced maternal age in third trimester   2. Diet controlled gestational diabetes mellitus (GDM) in third trimester - her fasting was 85 today, 2 hours after breakfast 94. She only got her meter yesterday.  3. Language barrier   4. Obesity in pregnancy   5. Supervision of other normal pregnancy, antepartum   Preterm labor symptoms and general  obstetric precautions including but not limited to vaginal bleeding, contractions, leaking of fluid and fetal movement were reviewed in detail with the patient. Please refer to After Visit Summary for other counseling recommendations.  Return in about 2 weeks (around 07/07/2017).  No future appointments.  Allie Bossier, MD

## 2017-06-26 ENCOUNTER — Encounter: Payer: Self-pay | Admitting: Radiology

## 2017-07-07 ENCOUNTER — Ambulatory Visit (INDEPENDENT_AMBULATORY_CARE_PROVIDER_SITE_OTHER): Payer: Self-pay | Admitting: Family Medicine

## 2017-07-07 ENCOUNTER — Encounter: Payer: Self-pay | Admitting: Family Medicine

## 2017-07-07 VITALS — BP 101/65 | HR 83 | Wt 192.0 lb

## 2017-07-07 DIAGNOSIS — O24410 Gestational diabetes mellitus in pregnancy, diet controlled: Secondary | ICD-10-CM

## 2017-07-07 DIAGNOSIS — O09523 Supervision of elderly multigravida, third trimester: Secondary | ICD-10-CM

## 2017-07-07 DIAGNOSIS — Z3480 Encounter for supervision of other normal pregnancy, unspecified trimester: Secondary | ICD-10-CM

## 2017-07-07 DIAGNOSIS — Z3483 Encounter for supervision of other normal pregnancy, third trimester: Secondary | ICD-10-CM

## 2017-07-07 DIAGNOSIS — Z789 Other specified health status: Secondary | ICD-10-CM

## 2017-07-07 NOTE — Patient Instructions (Signed)
 Lactancia materna Breastfeeding Decidir amamantar es una de las mejores elecciones que puede hacer por usted y su beb. Un cambio en las hormonas durante el embarazo hace que las mamas produzcan leche materna en las glndulas productoras de leche. Las hormonas impiden que la leche materna sea liberada antes del nacimiento del beb. Adems, impulsan el flujo de leche luego del nacimiento. Una vez que ha comenzado a amamantar, pensar en el beb, as como la succin o el llanto, pueden estimular la liberacin de leche de las glndulas productoras de leche. Los beneficios de amamantar Las investigaciones demuestran que la lactancia materna ofrece muchos beneficios de salud para bebs y madres. Adems, ofrece una forma gratuita y conveniente de alimentar al beb. Para el beb  La primera leche (calostro) ayuda a mejorar el funcionamiento del aparato digestivo del beb.  Las clulas especiales de la leche (anticuerpos) ayudan a combatir las infecciones en el beb.  Los bebs que se alimentan con leche materna tambin tienen menos probabilidades de tener asma, alergias, obesidad o diabetes de tipo 2. Adems, tienen menor riesgo de sufrir el sndrome de muerte sbita del lactante (SMSL).  Los nutrientes de la leche materna son mejores para satisfacer las necesidades del beb en comparacin con la leche maternizada.  La leche materna mejora el desarrollo cerebral del beb. Para usted  La lactancia materna favorece el desarrollo de un vnculo muy especial entre la madre y el beb.  Es conveniente. La leche materna es econmica y siempre est disponible a la temperatura correcta.  La lactancia materna ayuda a quemar caloras. Le ayuda a perder el peso ganado durante el embarazo.  Hace que el tero vuelva al tamao que tena antes del embarazo ms rpido. Adems, disminuye el sangrado (loquios) despus del parto.  La lactancia materna contribuye a reducir el riesgo de tener diabetes de tipo 2,  osteoporosis, artritis reumatoide, enfermedades cardiovasculares y cncer de mama, ovario, tero y endometrio en el futuro. Informacin bsica sobre la lactancia Comienzo de la lactancia  Encuentre un lugar cmodo para sentarse o acostarse, con un buen respaldo para el cuello y la espalda.  Coloque una almohada o una manta enrollada debajo del beb para acomodarlo a la altura de la mama (si est sentada). Las almohadas para amamantar se han diseado especialmente a fin de servir de apoyo para los brazos y el beb mientras amamanta.  Asegrese de que la barriga del beb (abdomen) est frente a la suya.  Masajee suavemente la mama. Con las yemas de los dedos, masajee los bordes exteriores de la mama hacia adentro, en direccin al pezn. Esto estimula el flujo de leche. Si la leche fluye lentamente, es posible que deba continuar con este movimiento durante la lactancia.  Sostenga la mama con 4 dedos por debajo y el pulgar por arriba del pezn (forme la letra "C" con la mano). Asegrese de que los dedos se encuentren lejos del pezn y de la boca del beb.  Empuje suavemente los labios del beb con el pezn o con el dedo.  Cuando la boca del beb se abra lo suficiente, acrquelo rpidamente a la mama e introduzca todo el pezn y la arola, tanto como sea posible, dentro de la boca del beb. La arola es la zona de color que rodea al pezn. ? Debe haber ms arola visible por arriba del labio superior del beb que por debajo del labio inferior. ? Los labios del beb deben estar abiertos y extendidos hacia afuera (evertidos) para asegurar que   el beb se prenda de forma adecuada y cmoda. ? La lengua del beb debe estar entre la enca inferior y la mama.  Asegrese de que la boca del beb est en la posicin correcta alrededor del pezn (prendido). Los labios del beb deben crear un sello sobre la mama y estar doblados hacia afuera (invertidos).  Es comn que el beb succione durante 2 a 3 minutos  para que comience el flujo de leche materna. Cmo debe prenderse Es muy importante que le ensee al beb cmo prenderse adecuadamente a la mama. Si el beb no se prende adecuadamente, puede causar dolor en los pezones, reducir la produccin de leche materna y hacer que el beb tenga un escaso aumento de peso. Adems, si el beb no se prende adecuadamente al pezn, puede tragar aire durante la alimentacin. Esto puede causarle molestias al beb. Hacer eructar al beb al cambiar de mama puede ayudarlo a liberar el aire. Sin embargo, ensearle al beb cmo prenderse a la mama adecuadamente es la mejor manera de evitar que se sienta molesto por tragar aire mientras se alimenta. Signos de que el beb se ha prendido adecuadamente al pezn  Tironea o succiona de modo silencioso, sin causarle dolor. Los labios del beb deben estar extendidos hacia afuera (evertidos).  Se escucha que traga cada 3 o 4 succiones una vez que la leche ha comenzado a fluir (despus de que se produzca el reflejo de eyeccin de la leche).  Hay movimientos musculares por arriba y por delante de sus odos al succionar.  Signos de que el beb no se ha prendido adecuadamente al pezn  Hace ruidos de succin o de chasquido mientras se alimenta.  Siente dolor en los pezones.  Si cree que el beb no se prendi correctamente, deslice el dedo en la comisura de la boca y colquelo entre las encas del beb para interrumpir la succin. Intente volver a comenzar a amamantar. Signos de lactancia materna exitosa Signos del beb  El beb disminuir gradualmente el nmero de succiones o dejar de succionar por completo.  El beb se quedar dormido.  El cuerpo del beb se relajar.  El beb retendr una pequea cantidad de leche en la boca.  El beb se desprender solo del pecho.  Signos que presenta usted  Las mamas han aumentado la firmeza, el peso y el tamao 1 a 3 horas despus de amamantar.  Estn ms blandas inmediatamente  despus de amamantar.  Se producen un aumento del volumen de leche y un cambio en su consistencia y color hacia el quinto da de lactancia.  Los pezones no duelen, no estn agrietados ni sangran.  Signos de que su beb recibe la cantidad de leche suficiente  Mojar por lo menos 1 o 2paales durante las primeras 24horas despus del nacimiento.  Mojar por lo menos 5 o 6paales cada 24horas durante la primera semana despus del nacimiento. La orina debe ser clara o de color amarillo plido a los 5das de vida.  Mojar entre 6 y 8paales cada 24horas a medida que el beb sigue creciendo y desarrollndose.  Defeca por lo menos 3 veces en 24 horas a los 5 das de vida. Las heces deben ser blandas y amarillentas.  Defeca por lo menos 3 veces en 24 horas a los 7 das de vida. Las heces deben ser grumosas y amarillentas.  No registra una prdida de peso mayor al 10% del peso al nacer durante los primeros 3 das de vida.  Aumenta de peso un   promedio de 4 a 7onzas (113 a 198g) por semana despus de los 4 das de vida.  Aumenta de peso, diariamente, de manera uniforme a partir de los 5 das de vida, sin registrar prdida de peso despus de las 2semanas de vida. Despus de alimentarse, es posible que el beb regurgite una pequea cantidad de leche. Esto es normal. Frecuencia y duracin de la lactancia El amamantamiento frecuente la ayudar a producir ms leche y puede prevenir dolores en los pezones y las mamas extremadamente llenas (congestin mamaria). Alimente al beb cuando muestre signos de hambre o si siente la necesidad de reducir la congestin de las mamas. Esto se denomina "lactancia a demanda". Las seales de que el beb tiene hambre incluyen las siguientes:  Aumento del estado de alerta, actividad o inquietud.  Mueve la cabeza de un lado a otro.  Abre la boca cuando se le toca la mejilla o la comisura de la boca (reflejo de bsqueda).  Aumenta las vocalizaciones, tales como  sonidos de succin, se relame los labios, emite arrullos, suspiros o chirridos.  Mueve la mano hacia la boca y se chupa los dedos o las manos.  Est molesto o llora.  Evite el uso del chupete en las primeras 4 a 6 semanas despus del nacimiento del beb. Despus de este perodo, podr usar un chupete. Las investigaciones demostraron que el uso del chupete durante el primer ao de vida del beb disminuye el riesgo de tener el sndrome de muerte sbita del lactante (SMSL). Permita que el nio se alimente en cada mama todo lo que desee. Cuando el beb se desprende o se queda dormido mientras se est alimentando de la primera mama, ofrzcale la segunda. Debido a que, con frecuencia, los recin nacidos estn somnolientos las primeras semanas de vida, es posible que deba despertar al beb para alimentarlo. Los horarios de lactancia varan de un beb a otro. Sin embargo, las siguientes reglas pueden servir como gua para ayudarla a garantizar que el beb se alimenta adecuadamente:  Se puede amamantar a los recin nacidos (bebs de 4 semanas o menos de vida) cada 1 a 3 horas.  No deben transcurrir ms de 3 horas durante el da o 5 horas durante la noche sin que se amamante a los recin nacidos.  Debe amamantar al beb un mnimo de 8 veces en un perodo de 24 horas.  Extraccin de leche materna La extraccin y el almacenamiento de la leche materna le permiten asegurarse de que el beb se alimente exclusivamente de su leche materna, aun en momentos en los que no puede amamantar. Esto tiene especial importancia si debe regresar al trabajo en el perodo en que an est amamantando o si no puede estar presente en los momentos en que el beb debe alimentarse. Su asesor en lactancia puede ayudarla a encontrar un mtodo de extraccin que funcione mejor para usted y orientarla sobre cunto tiempo es seguro almacenar leche materna. Cmo cuidar las mamas durante la lactancia Los pezones pueden secarse, agrietarse y  doler durante la lactancia. Las siguientes recomendaciones pueden ayudarla a mantener las mamas humectadas y sanas:  Evite usar jabn en los pezones.  Use un sostn de soporte diseado especialmente para la lactancia materna. Evite usar sostenes con aro o sostenes muy ajustados (sostenes deportivos).  Seque al aire sus pezones durante 3 a 4minutos despus de amamantar al beb.  Utilice solo apsitos de algodn en el sostn para absorber las prdidas de leche. La prdida de un poco de leche materna   entre las tomas es normal.  Utilice lanolina sobre los pezones luego de amamantar. La lanolina ayuda a mantener la humedad normal de la piel. La lanolina pura no es perjudicial (no es txica) para el beb. Adems, puede extraer manualmente algunas gotas de leche materna y masajear suavemente esa leche sobre los pezones para que la leche se seque al aire.  Durante las primeras semanas despus del nacimiento, algunas mujeres experimentan congestin mamaria. La congestin mamaria puede hacer que sienta las mamas pesadas, calientes y sensibles al tacto. El pico de la congestin mamaria ocurre en el plazo de los 3 a 5 das despus del parto. Las siguientes recomendaciones pueden ayudarla a aliviar la congestin mamaria:  Vace por completo las mamas al amamantar o extraer leche. Puede aplicar calor hmedo en las mamas (en la ducha o con toallas hmedas para manos) antes de amamantar o extraer leche. Esto aumenta la circulacin y ayuda a que la leche fluya. Si el beb no vaca por completo las mamas cuando lo amamanta, extraiga la leche restante despus de que haya finalizado.  Aplique compresas de hielo sobre las mamas inmediatamente despus de amamantar o extraer leche, a menos que le resulte demasiado incmodo. Haga lo siguiente: ? Ponga el hielo en una bolsa plstica. ? Coloque una toalla entre la piel y la bolsa de hielo. ? Coloque el hielo durante 20minutos, 2 o 3veces por da.  Asegrese de que el  beb est prendido y se encuentre en la posicin correcta mientras lo alimenta.  Si la congestin mamaria persiste luego de 48 horas o despus de seguir estas recomendaciones, comunquese con su mdico o un asesor en lactancia. Recomendaciones de salud general durante la lactancia  Consuma 3 comidas y 3 colaciones saludables todos los das. Las madres bien alimentadas que amamantan necesitan entre 450 y 500 caloras adicionales por da. Puede cumplir con este requisito al aumentar la cantidad de una dieta equilibrada que realice.  Beba suficiente agua para mantener la orina clara o de color amarillo plido.  Descanse con frecuencia, reljese y siga tomando sus vitaminas prenatales para prevenir la fatiga, el estrs y los niveles bajos de vitaminas y minerales en el cuerpo (deficiencias de nutrientes).  No consuma ningn producto que contenga nicotina o tabaco, como cigarrillos y cigarrillos electrnicos. El beb puede verse afectado por las sustancias qumicas de los cigarrillos que pasan a la leche materna y por la exposicin al humo ambiental del tabaco. Si necesita ayuda para dejar de fumar, consulte al mdico.  Evite el consumo de alcohol.  No consuma drogas ilegales o marihuana.  Antes de usar cualquier medicamento, hable con el mdico. Estos incluyen medicamentos recetados y de venta libre, como tambin vitaminas y suplementos a base de hierbas. Algunos medicamentos, que pueden ser perjudiciales para el beb, pueden pasar a travs de la leche materna.  Puede quedar embarazada durante la lactancia. Si se desea un mtodo anticonceptivo, consulte al mdico sobre cules son las opciones seguras durante la lactancia. Dnde encontrar ms informacin: Liga internacional La Leche: www.llli.org. Comunquese con un mdico si:  Siente que quiere dejar de amamantar o se siente frustrada con la lactancia.  Sus pezones estn agrietados o sangran.  Sus mamas estn irritadas, sensibles o  calientes.  Tiene los siguientes sntomas: ? Dolor en las mamas o en los pezones. ? Un rea hinchada en cualquiera de las mamas. ? Fiebre o escalofros. ? Nuseas o vmitos. ? Drenaje de otro lquido distinto de la leche materna desde los pezones.    Sus mamas no se llenan antes de amamantar al beb para el quinto da despus del parto.  Se siente triste y deprimida.  El beb: ? Est demasiado somnoliento como para comer bien. ? Tiene problemas para dormir. ? Tiene ms de 1 semana de vida y moja menos de 6 paales en un periodo de 24 horas. ? No ha aumentado de peso a los 5 das de vida.  El beb defeca menos de 3 veces en 24 horas.  La piel del beb o las partes blancas de los ojos se vuelven amarillentas. Solicite ayuda de inmediato si:  El beb est muy cansado (letargo) y no se quiere despertar para comer.  Le sube la fiebre sin causa. Resumen  La lactancia materna ofrece muchos beneficios de salud para bebs y madres.  Intente amamantar a su beb cuando muestre signos tempranos de hambre.  Haga cosquillas o empuje suavemente los labios del beb con el dedo o el pezn para lograr que el beb abra la boca. Acerque el beb a la mama. Asegrese de que la mayor parte de la arola se encuentre dentro de la boca del beb. Ofrzcale una mama y haga eructar al beb antes de pasar a la otra.  Hable con su mdico o asesor en lactancia si tiene dudas o problemas con la lactancia. Esta informacin no tiene como fin reemplazar el consejo del mdico. Asegrese de hacerle al mdico cualquier pregunta que tenga. Document Released: 01/10/2005 Document Revised: 05/02/2016 Document Reviewed: 05/02/2016 Elsevier Interactive Patient Education  2018 [employer].  

## 2017-07-07 NOTE — Progress Notes (Signed)
   PRENATAL VISIT NOTE  Subjective:  Mellody DrownMaria Guijosa-Padilla is a 37 y.o. [phone] at 1219w5d being seen today for ongoing prenatal care.  She is currently monitored for the following issues for this high-risk pregnancy and has Supervision of other normal pregnancy, antepartum; Advanced maternal age in multigravida; Obesity (BMI 30-39.9); Obesity in pregnancy; Language barrier; and GDM (gestational diabetes mellitus) on their problem list.  Patient reports no complaints.  Contractions: Not present. Vag. Bleeding: None.  Movement: Present. Denies leaking of fluid.   The following portions of the patient's history were reviewed and updated as appropriate: allergies, current medications, past family history, past medical history, past social history, past surgical history and problem list. Problem list updated.  Objective:   Vitals:   07/07/17 0950  BP: 101/65  Pulse: 83  Weight: 192 lb (87.1 kg)    Fetal Status: Fetal Heart Rate (bpm): 141 Fundal Height: 32 cm Movement: Present     General:  Alert, oriented and cooperative. Patient is in no acute distress.  Skin: Skin is warm and dry. No rash noted.   Cardiovascular: Normal heart rate noted  Respiratory: Normal respiratory effort, no problems with respiration noted  Abdomen: Soft, gravid, appropriate for gestational age.  Pain/Pressure: Present     Pelvic: Cervical exam deferred        Extremities: Normal range of motion.  Edema: None  Mental Status: Normal mood and affect. Normal behavior. Normal judgment and thought content.   Assessment and Plan:  Pregnancy: G4P3003 at 2319w5d  1. Supervision of other normal pregnancy, antepartum   2. Diet controlled gestational diabetes mellitus (GDM) in third trimester CBG's in CMA note from [facility] and are normal except for few fastings on the weekends when she admits [facility] indescretion [facility], shows growth in the 33%, nml fluid  3. Multigravida of advanced maternal age in  third trimester Declined testing  4. Language barrier Spanish interpreter: live used   Preterm labor symptoms and general obstetric precautions including but not limited to vaginal bleeding, contractions, leaking of fluid and fetal movement were reviewed in detail with the patient. Please refer to After Visit Summary for other counseling recommendations.  Return in 2 weeks (on 07/21/2017).  Future Appointments  Date Time Provider Department Center  07/20/2017 10:30 AM [place] BingPickens, Charlie, MD [facility]    Reva Boresanya S Bekim Werntz, MD

## 2017-07-20 ENCOUNTER — Encounter: Payer: Self-pay | Admitting: Radiology

## 2017-07-20 ENCOUNTER — Ambulatory Visit (INDEPENDENT_AMBULATORY_CARE_PROVIDER_SITE_OTHER): Payer: Self-pay | Admitting: Obstetrics and Gynecology

## 2017-07-20 VITALS — BP 96/62 | HR 83 | Wt 192.8 lb

## 2017-07-20 DIAGNOSIS — O24410 Gestational diabetes mellitus in pregnancy, diet controlled: Secondary | ICD-10-CM

## 2017-07-20 DIAGNOSIS — O09523 Supervision of elderly multigravida, third trimester: Secondary | ICD-10-CM

## 2017-07-20 DIAGNOSIS — Z3480 Encounter for supervision of other normal pregnancy, unspecified trimester: Secondary | ICD-10-CM

## 2017-07-20 DIAGNOSIS — Z789 Other specified health status: Secondary | ICD-10-CM

## 2017-07-20 NOTE — Progress Notes (Signed)
Prenatal Visit Note Date: 07/20/2017 Clinic: [facility]  Subjective:  Joanna Whitney is a 37 y.o. [phone] at 5279w4d being seen today for ongoing prenatal care.  She is currently monitored for the following issues for this high-risk pregnancy and has Supervision of other normal pregnancy, antepartum; Advanced maternal age in multigravida; Obesity (BMI 30-39.9); Obesity in pregnancy; Language barrier; and GDM (gestational diabetes mellitus) on their problem list.  Patient reports no complaints.   Contractions: Not present. Vag. Bleeding: None.  Movement: Present. Denies leaking of fluid.   The following portions of the patient's history were reviewed and updated as appropriate: allergies, current medications, past family history, past medical history, past social history, past surgical history and problem list. Problem list updated.  Objective:   Vitals:   07/20/17 1028  BP: 96/62  Pulse: 83  Weight: 192 lb 12.8 oz (87.5 kg)    Fetal Status: Fetal Heart Rate (bpm): 145 Fundal Height: 34 cm Movement: Present  Presentation: Vertex  General:  Alert, oriented and cooperative. Patient is in no acute distress.  Skin: Skin is warm and dry. No rash noted.   Cardiovascular: Normal heart rate noted  Respiratory: Normal respiratory effort, no problems with respiration noted  Abdomen: Soft, gravid, appropriate for gestational age. Pain/Pressure: Absent     Pelvic:  Cervical exam deferred        Extremities: Normal range of motion.  Edema: None  Mental Status: Normal mood and affect. Normal behavior. Normal judgment and thought content.   Urinalysis:      Assessment and Plan:  Pregnancy: G4P3003 at 6679w4d  1. Supervision of other normal pregnancy, antepartum Routine care. nexplanon  2. Language barrier Interpreter used  3. Multigravida of advanced maternal age in third trimester No issues  4. Diet controlled gestational diabetes mellitus (GDM) in third  trimester Normal log book. Will get surveillance growth at 36-38wks  Preterm labor symptoms and general obstetric precautions including but not limited to vaginal bleeding, contractions, leaking of fluid and fetal movement were reviewed in detail with the patient. Please refer to After Visit Summary for other counseling recommendations.  Return in about 2 weeks (around 08/03/2017) for rob.   [place] BingPickens, Carthel Castille, MD

## 2017-08-04 ENCOUNTER — Ambulatory Visit (INDEPENDENT_AMBULATORY_CARE_PROVIDER_SITE_OTHER): Payer: Self-pay | Admitting: Obstetrics & Gynecology

## 2017-08-04 ENCOUNTER — Other Ambulatory Visit: Payer: Self-pay

## 2017-08-04 VITALS — BP 98/63 | HR 69 | Wt 190.0 lb

## 2017-08-04 DIAGNOSIS — E669 Obesity, unspecified: Secondary | ICD-10-CM

## 2017-08-04 DIAGNOSIS — Z3480 Encounter for supervision of other normal pregnancy, unspecified trimester: Secondary | ICD-10-CM

## 2017-08-04 DIAGNOSIS — Z789 Other specified health status: Secondary | ICD-10-CM

## 2017-08-04 DIAGNOSIS — O24410 Gestational diabetes mellitus in pregnancy, diet controlled: Secondary | ICD-10-CM

## 2017-08-04 DIAGNOSIS — Z113 Encounter for screening for infections with a predominantly sexual mode of transmission: Secondary | ICD-10-CM

## 2017-08-04 DIAGNOSIS — O09523 Supervision of elderly multigravida, third trimester: Secondary | ICD-10-CM

## 2017-08-04 LAB — OB RESULTS CONSOLE GBS: STREP GROUP B AG: POSITIVE

## 2017-08-04 NOTE — Progress Notes (Signed)
   PRENATAL VISIT NOTE  Subjective:  Joanna Whitney is a 37 y.o. [phone] at 5938w5d being seen today for ongoing prenatal care.  She is currently monitored for the following issues for this high-risk pregnancy and has Supervision of other normal pregnancy, antepartum; Advanced maternal age in multigravida; Obesity (BMI 30-39.9); Obesity in pregnancy; Language barrier; and GDM (gestational diabetes mellitus) on their problem list.  Patient reports no complaints.  Contractions: Not present. Vag. Bleeding: None.  Movement: Present. Denies leaking of fluid.   The following portions of the patient's history were reviewed and updated as appropriate: allergies, current medications, past family history, past medical history, past social history, past surgical history and problem list. Problem list updated.  Objective:   Vitals:   08/04/17 0916  BP: 98/63  Pulse: 69  Weight: 190 lb (86.2 kg)    Fetal Status: Fetal Heart Rate (bpm): 143   Movement: Present     General:  Alert, oriented and cooperative. Patient is in no acute distress.  Skin: Skin is warm and dry. No rash noted.   Cardiovascular: Normal heart rate noted  Respiratory: Normal respiratory effort, no problems with respiration noted  Abdomen: Soft, gravid, appropriate for gestational age.  Pain/Pressure: Present     Pelvic: Cervical exam performed        Extremities: Normal range of motion.  Edema: None  Mental Status: Normal mood and affect. Normal behavior. Normal judgment and thought content.   Assessment and Plan:  Pregnancy: G4P3003 at 7538w5d  1. Multigravida of advanced maternal age in third trimester   2. Diet controlled gestational diabetes mellitus (GDM) in third trimester - sugars are all within normal limits. I told her that she can check only 2 times per day - plan for IOL at around 39 weeks  3. Language barrier - live interpretor present  4. Obesity (BMI 30-39.9) - appropriate weight gain  5.  Supervision of other normal pregnancy, antepartum  - Culture, beta strep (group b only) - Cervicovaginal ancillary only  Preterm labor symptoms and general obstetric precautions including but not limited to vaginal bleeding, contractions, leaking of fluid and fetal movement were reviewed in detail with the patient. Please refer to After Visit Summary for other counseling recommendations.  No follow-ups on file.  No future appointments.  Allie BossierMyra C Brayon Bielefeld, MD

## 2017-08-04 NOTE — Progress Notes (Signed)
ROB/GBS. C/o intermittent constipation.

## 2017-08-04 NOTE — Progress Notes (Deleted)
ROB GBS 

## 2017-08-07 LAB — CERVICOVAGINAL ANCILLARY ONLY
Chlamydia: NEGATIVE
Neisseria Gonorrhea: NEGATIVE

## 2017-08-08 ENCOUNTER — Encounter: Payer: Self-pay | Admitting: Obstetrics & Gynecology

## 2017-08-08 DIAGNOSIS — O99820 Streptococcus B carrier state complicating pregnancy: Secondary | ICD-10-CM | POA: Insufficient documentation

## 2017-08-08 LAB — CULTURE, BETA STREP (GROUP B ONLY): Strep Gp B Culture: POSITIVE — AB

## 2017-08-11 ENCOUNTER — Ambulatory Visit (INDEPENDENT_AMBULATORY_CARE_PROVIDER_SITE_OTHER): Payer: Self-pay | Admitting: Obstetrics and Gynecology

## 2017-08-11 VITALS — BP 105/69 | HR 80 | Wt 191.8 lb

## 2017-08-11 DIAGNOSIS — O99210 Obesity complicating pregnancy, unspecified trimester: Secondary | ICD-10-CM

## 2017-08-11 DIAGNOSIS — O09523 Supervision of elderly multigravida, third trimester: Secondary | ICD-10-CM

## 2017-08-11 DIAGNOSIS — Z789 Other specified health status: Secondary | ICD-10-CM

## 2017-08-11 DIAGNOSIS — E669 Obesity, unspecified: Secondary | ICD-10-CM

## 2017-08-11 DIAGNOSIS — O0990 Supervision of high risk pregnancy, unspecified, unspecified trimester: Secondary | ICD-10-CM

## 2017-08-11 NOTE — Progress Notes (Signed)
Prenatal Visit Note Date: 08/11/2017 Clinic: [facility]  Subjective:  Joanna Whitney is a [age] y.o. [phone] at 248w5d being seen today for ongoing prenatal care.  She is currently monitored for the following issues for this high-risk pregnancy and has Supervision of high risk pregnancy, antepartum; Advanced maternal age in multigravida; Obesity (BMI 30-39.9); Obesity in pregnancy; Language barrier; GDM (gestational diabetes mellitus); and GBS (group B Streptococcus carrier), +RV culture, currently pregnant on their problem list.  Patient reports no complaints.   Contractions: Not present. Vag. Bleeding: None.  Movement: Present. Denies leaking of fluid.   The following portions of the patient's history were reviewed and updated as appropriate: allergies, current medications, past family history, past medical history, past social history, past surgical history and problem list. Problem list updated.  Objective:   Vitals:   08/11/17 0916  BP: 105/69  Pulse: 80  Weight: 191 lb 12.8 oz (87 kg)    Fetal Status: Fetal Heart Rate (bpm): 152   Movement: Present     General:  Alert, oriented and cooperative. Patient is in no acute distress.  Skin: Skin is warm and dry. No rash noted.   Cardiovascular: Normal heart rate noted  Respiratory: Normal respiratory effort, no problems with respiration noted  Abdomen: Soft, gravid, appropriate for gestational age. Pain/Pressure: Present     Pelvic:  Cervical exam deferred        Extremities: Normal range of motion.  Edema: None  Mental Status: Normal mood and affect. Normal behavior. Normal judgment and thought content.   Urinalysis:      Assessment and Plan:  Pregnancy: G4P3003 at 398w5d  1. Supervision of high risk pregnancy, antepartum Routine care. Cephalic on bedside u/s today. D/w pt more re: BC [facility]  2. Multigravida of advanced maternal age in third trimester  3. Language barrier Interpreter used  4. Obesity in  pregnancy  5. Obesity (BMI 30-39.9)  6. GDMa1 Normal BS log today. Has [place] [facility] u/s on Monday. Set up IOL at [facility].   Term labor symptoms and general obstetric precautions including but not limited to vaginal bleeding, contractions, leaking of fluid and fetal movement were reviewed in detail with the patient. Please refer to After Visit Summary for other counseling recommendations.  Return in about 1 week (around 08/18/2017) for rob.   [place] BingPickens, Eddith Mentor, MD

## 2017-08-16 ENCOUNTER — Ambulatory Visit (INDEPENDENT_AMBULATORY_CARE_PROVIDER_SITE_OTHER): Payer: Self-pay | Admitting: Obstetrics and Gynecology

## 2017-08-16 ENCOUNTER — Encounter: Payer: Self-pay | Admitting: Obstetrics & Gynecology

## 2017-08-16 ENCOUNTER — Encounter: Payer: Self-pay | Admitting: Obstetrics and Gynecology

## 2017-08-16 VITALS — BP 114/75 | HR 72 | Wt 193.0 lb

## 2017-08-16 DIAGNOSIS — Z789 Other specified health status: Secondary | ICD-10-CM

## 2017-08-16 DIAGNOSIS — O09523 Supervision of elderly multigravida, third trimester: Secondary | ICD-10-CM

## 2017-08-16 DIAGNOSIS — O24410 Gestational diabetes mellitus in pregnancy, diet controlled: Secondary | ICD-10-CM

## 2017-08-16 DIAGNOSIS — O0990 Supervision of high risk pregnancy, unspecified, unspecified trimester: Secondary | ICD-10-CM

## 2017-08-16 NOTE — Patient Instructions (Signed)
Induccin del trabajo de parto  (Labor Induction)  Se denomina induccin del trabajo de parto cuando se inician acciones para hacer que una mujer embarazada comience el trabajo de parto. La mayora de las mujeres comienzan el trabajo de parto sin ayuda entre las semanas 37 y 42 del embarazo. Cuando esto no ocurre o cuando hay una necesidad mdica, pueden utilizarse diferentes mtodos para inducirlo. La induccin del trabajo de parto hace que el tero se contraiga. Tambin hace que el cuello del tero se ablandemadure), se abra (se dilate), y se afine (se borre). Generalmente el trabajo de parto no se induce antes de las 39 semanas excepto que haya un problema con el beb o con la madre.  Antes de inducir el trabajo de parto, el mdico considerar cierto nmero de factores incluyendo los siguientes:   El estado del beb.   Cuntas semanas tiene de embarazo.   La madurez de los pulmones del beb.   El estado del cuello del tero.   La posicin del beb.  CULES SON LOS MOTIVOS PARA INDUCIR UN PARTO?  El trabajo de parto puede inducirse por las siguientes razones:   La salud del beb o de la madre estn en riesgo.   El embarazo se ha pasado de trmino en 1 semana o ms.   Ha roto la bolsa de aguas pero no se ha iniciado el trabajo de parto por s mismo.   La madre tiene algn trastorno de salud o una enfermedad grave, como hipertensin arterial, una infeccin, desprendimiento abrupto de la placenta o diabetes.   Hay escaso lquido amnitico alrededor del beb.   El beb presenta sufrimiento.  La conveniencia o el deseo de que el beb nazca en una cierta fecha no es un motivo para inducir el parto.  CULES SON LOS MTODOS UTILIZADOS PARA INDUCIR EL TRABAJO DE PARTO?  Algunos mtodos de induccin del trabajo de parto son:   Administracin del medicamentos prostaglandina. Este medicamento hace que el cuello uterino se dilate y madure. Este medicamento tambin iniciar las contracciones. Puede tomarse por  boca o insertarse en la vagina en forma de supositorio.   Insercin en la vagina de un tubo delgado (catter) con un baln en el extremo para dilatar el cuello del tero. Una vez insertado, el baln se infla con agua, lo que provoca la apertura del cuello del tero.   Ruptura de las membranas. El mdico separa el saco amnitico del cuello uterino, haciendo que el cuello uterino se distienda y cause la liberacin de la hormona llamada progesterona. Esto hace que el tero se contraiga. Este procedimiento se realiza durante una visita al consultorio mdico. Le indicarn que vuelva a su casa y espere que se inicien las contracciones. Luego tendr que volver para la induccin.   Ruptura de la bolsa de aguas. El mdico romper el saco amnitico con un pequeo instrumento. Una vez que el saco amnitico se rompe, las contracciones deben comenzar. Pueden pasar algunas horas hasta que haga efecto.   Medicamentos que desencadenen o intensifiquen las contracciones. Se lo administrarn a travs de un catter por va intravenosa (IV) que se inserta en una de las venas del brazo.  Todos los mtodos de induccin, excepto la ruptura de membranas, se realizan en el hospital. La induccin se realizar en el hospital, de modo que usted y el beb puedan ser controlados cuidadosamente.  CUNTO TIEMPO LLEVA INDUCIR EL TRABAJO DE PARTO?  Algunas inducciones pueden demorar entre 2 y 3 das. Generalmente lleva menos   Si han pasado 2 o [address] y no se inicia el trabajo de [place], podrn enviarla a su casa o Magazine features editorrealizar una cesrea. CULES SON LOS RIESGOS ASOCIADOS CON LA INDUCCiN DEL TRABAJO DE PARTO? Algunos de los riesgos de la induccin son:  Cambios en la frecuencia cardaca fetal, por ejemplo los latidos son demasiado rpidos, o lentos, o errticos.  Riesgo de distrs  fetal.  Posibilidad de infeccin en la madre o el beb.  Aumento de la posibilidad de que sea necesaria una cesrea.  Ruptura (abrupcin) de la placenta del tero (raro).  Ruptura uterina (muy raro). Cuando es Passenger transport managernecesario realizar la induccin por razones mdicas, los beneficios deben superar a los IXLriesgos. CULES SON ALGUNAS RAZONES PARA NO INDUCIR EL TRABAJO DE PARTO? La induccin no debe realizarse si:  Se demuestra que el beb no tolera el trabajo de [place]o.  Fue sometida anteriormente a Personnel officercirugas en el tero, como una miomectoma o le han extirpado fibromas.  La placenta est en una posicin muy baja en el tero y obstruye la abertura del cuello (placenta previa).  El beb no est ubicado con la Walgreencabeza hacia bajo.  El cordn umbilical cae hacia el canal de parto, adelante del beb. Esto puede cortar el suministro de [place] West[place]angre y oxgeno al beb.  Fue sometida a Higher education careers adviseruna cesrea anteriormente.  Hay circunstancias poco habituales, como que el beb es Doctor, general practiceextremadamente prematuro. Esta informacin no tiene Theme park managercomo fin reemplazar el consejo del mdico. Asegrese de hacerle al mdico cualquier pregunta que tenga. Document Released: 04/19/2007 Document Revised: 05/04/2015 Document Reviewed: 08/09/2012 Elsevier Interactive Patient Education  2017 Elsevier Inc. Parto prematuro Futures traderelectivo (Early Elective Birth) Cuando se decide inducir o extraer el feto antes de su madurez, se denomina parto prematuro electivo. La duracin de un embarazo es de 9 meses, o 40 semanas a partir del inicio del ltimo perodo menstrual de Architectural technologistla mujer. La Harley-Davidsonmayora de las mujeres inician de forma natural el trabajo de parto alrededor de las 40 semanas. Un embarazo de trmino se estima entre 37 y 42 semanas de gestacin. En la actualidad, los partos prematuros electivos pueden realizarse en algn momento despus de la semana 39. La Harley-Davidsonmayora de los mdicos practica dentro de los lineamientos el parto de un beb antes de las 42 semanas y no  antes de las 39 semanas. Siempre hay excepciones a este intervalo de Fabricatiempo, y los riesgos que implica para la madre y el beb deben tenerse en cuenta en esos casos. La induccin del parto hace referencia al uso de medicamentos para provocar contracciones. Se denomina trabajo de parto cuando el cuello uterino comienza a ensancharse (dilatarse). Un trabajo de parto activo es cuando hay contracciones y el cuello uterino se ha dilatado al menos 4 cm. Generalmente, cunto menos tiempo tenga la madre de [place]embarazo, ms tiempo demorar inducirlo. Cuando el cuello del tero est preparado (dilatado y blando), realizar una induccin puede tardar menos de un da. Sin embargo, cuando el cuello del tero est lejos de estar preparado ([place], [place], y [place]firme), pueden pasar varios [employer] en el hospital para que el trabajo de parto comience. En la actualidad, se consideran las 39 semanas como el menor tiempo posible para que el mdico pueda comenzar con el proceso de induccin. Esto se debe a que cuanto ms tiempo permanece el beb en el interior del Colometero, menores son los riesgos tanto para el beb como para la Makandamadre. Sin embargo, a veces hay buenos motivos para que un embarazo sea inducido y se haga  antes de las 39 semanas de gestacin. Estas excepciones son especficas para cada embarazo individual y deben tenerse en cuenta para cada caso. Una buena razn para inducir un embarazo puede no ser una buena Higher education careers adviser. MOTIVOS PARA UN PARTO PREMATURO ELECTIVO Es ms seguro inducir el parto antes de las 39 semanas si:  Una mujer est embarazada de ms de 1 beb. Los estndares actuales indican inducir los partos gemelares a las 38 semanas.  La mujer tiene complicaciones, como: ? Hipertensin causada por el embarazo (preeclampsia). ? Hemorragias. ? Infeccin.  Algunas enfermedades que afectan la salud del beb, por ejemplo: ? Restriccin del crecimiento intrauterino (RCIU), en la que el beb no se desarrolla  bien. ? Frecuencia cardaca fetal anormal en el monitor (trazado inestable). ? Disminucin del lquido que rodea al beb (oligohidramnios). ? Tener problemas placentarios.  Prdida del lquido que rodea al feto (lquido amnitico). Hay muchas otras razones de seguridad por las que un embarazo pueda necesitar induccin temprana. RAZONES CONTRA EL PARTO PREMATURO ELECTIVO A veces, la eleccin del nacimiento temprano no es la mejor opcin. Puede no ser una buena opcin si:  Es porque le resulta ms conveniente.  Usted quiere que el beb nazca en una fecha determinada, como un da de [place].  Es ms probable que necesite una cesrea antes de las 39 semanas. Un parto por cesrea puede conducir a otros problemas. Los problemas son infecciones, sangrado y no tener suficiente hierro en la sangre (anemia), lo que puede causar debilidad. Bebs nacidos prematuramente (34-37 semanas de gestacin):  Es posible que necesiten cuidados especiales en el hospital o en una sala de cuidados especiales.  Tienen un riesgo mayor de: ? Dao cerebral. ? Problemas para alimentarse. ? Problemas respiratorios. ? Desarrollo fsico y mental lento.  Puede necesitar cuidados especiales en una unidad de cuidados intensivos neonatales (UCIN), pero esto no es frecuente. La duracin de la permanencia del beb en el hospital depender de la rapidez con la que progrese a un nivel seguro de cuidado.  Tienen un riesgo mayor de: ? Infeccin. ? Hemorragia intracerebral. ? Muerte durante el primer ao de vida. REDUCCIN DE LOS PARTOS PREMATUROS ELECTIVOS Tener un embarazo de ms de 42 semanas no es bueno para el beb ni para la [place]. Un embarazo a tiempo es lo mejor para ambos. Un parto anticipado puede ser peligroso para usted y para el beb. Recuerde:  Elegir un parto prematuro puede conducir a un parto por cesrea. Esto puede causar otros problemas para la madre y para el beb.  Puede traer trastornos en el desarrollo  de su hijo.  El cerebro de un beb contina desarrollndose mientras est en el tero.  El organismo del beb contina desarrollndose. El beb estar en mejores condiciones para respirar y comer cuando nace cerca de la fecha estimada.  Un beb que permanece en el tero responde mejor. Tambin el beb tendr un mejor vnculo con usted. Esta informacin no tiene Theme park manager el consejo del mdico. Asegrese de hacerle al mdico cualquier pregunta que tenga. Document Released: 10/05/2011 Document Revised: 10/31/2012 Document Reviewed: 08/09/2012 Elsevier Interactive Patient Education  2017 [employer].

## 2017-08-16 NOTE — Progress Notes (Signed)
   PRENATAL VISIT NOTE  Subjective:  Joanna Whitney is a 37 y.o. [phone] at 9319w3d being seen today for ongoing prenatal care.  She is currently monitored for the following issues for this high-risk pregnancy and has Supervision of high risk pregnancy, antepartum; Advanced maternal age in multigravida; Obesity (BMI 30-39.9); Obesity in pregnancy; Language barrier; GDM (gestational diabetes mellitus); and GBS (group B Streptococcus carrier), +RV culture, currently pregnant on their problem list.  Patient reports occasional contractions on Monday 08/14/2017.  Contractions: Not present.  .  Movement: Present. Denies leaking of fluid.   The following portions of the patient's history were reviewed and updated as appropriate: allergies, current medications, past family history, past medical history, past social history, past surgical history and problem list. Problem list updated.  Objective:   Vitals:   08/16/17 1522  BP: 114/75  Pulse: 72  Weight: 193 lb (87.5 kg)    Fetal Status: Fetal Heart Rate (bpm): 150 Fundal Height: 38 cm Movement: Present     General:  Alert, oriented and cooperative. Patient is in no acute distress.  Skin: Skin is warm and dry. No rash noted.   Cardiovascular: Normal heart rate noted  Respiratory: Normal respiratory effort, no problems with respiration noted  Abdomen: Soft, gravid, appropriate for gestational age.  Pain/Pressure: Present     Pelvic: Cervical exam performed Dilation: Fingertip Effacement (%): 40 Station: Ballotable  Extremities: Normal range of motion.  Edema: None  Mental Status: Normal mood and affect. Normal behavior. Normal judgment and thought content.   Assessment and Plan:  Pregnancy: G4P3003 at 6919w3d  1. Supervision of high risk pregnancy, antepartum - IOL scheduled for 08/24/2017 @ 0730 - IOL orders placed in Epic - Patient notified of date and time of IOL  2. Multigravida of advanced maternal age in third trimester - Patient  requested results from U/S that was done on Monday 08/14/2017 - Notified that U/S results have not been sent to our office yet and will have to be reviewed at next visit.  - TC to [place]; U/S not read by radiologist at the time of the call.  3. Language barrier - In-person Spanish Interpreter used for assessment, exam and IOL instructions  4. Diet controlled gestational diabetes mellitus (GDM) in third trimester - BS log reviewed - Discussed with patient the need to increase daily protein intake to prevent PP BS from being too low  Term labor symptoms and general obstetric precautions including but not limited to vaginal bleeding, contractions, leaking of fluid and fetal movement were reviewed in detail with the patient. Please refer to After Visit Summary for other counseling recommendations.  Return in about 1 week (around 08/23/2017) for Return OB visit.  Future Appointments  Date Time Provider Department Center  08/22/2017 11:30 AM Reva BoresPratt, Tanya S, MD [facility]  08/24/2017  7:30 AM [facility] RO[facility] None    Raelyn Moraolitta Allaina Brotzman, CNM

## 2017-08-16 NOTE — Progress Notes (Incomplete)
   PRENATAL VISIT NOTE  Subjective:  Joanna Whitney is a 37 y.o. [phone] at 7576w3d being seen today for ongoing prenatal care.  She is currently monitored for the following issues for this {Blank single:19197::"high-risk","low-risk"} pregnancy and has Supervision of high risk pregnancy, antepartum; Advanced maternal age in multigravida; Obesity (BMI 30-39.9); Obesity in pregnancy; Language barrier; GDM (gestational diabetes mellitus); and GBS (group B Streptococcus carrier), +RV culture, currently pregnant on their problem list.  Patient reports {sx:14538}.  Contractions: Not present.  .  Movement: Present. Denies leaking of fluid.   The following portions of the patient's history were reviewed and updated as appropriate: allergies, current medications, past family history, past medical history, past social history, past surgical history and problem list. Problem list updated.  Objective:   Vitals:   08/16/17 1522  BP: 114/75  Pulse: 72  Weight: 87.5 kg (193 lb)    Fetal Status: Fetal Heart Rate (bpm): 150   Movement: Present     General:  Alert, oriented and cooperative. Patient is in no acute distress.  Skin: Skin is warm and dry. No rash noted.   Cardiovascular: Normal heart rate noted  Respiratory: Normal respiratory effort, no problems with respiration noted  Abdomen: Soft, gravid, appropriate for gestational age.  Pain/Pressure: Present     Pelvic: {Blank single:19197::"Cervical exam performed","Cervical exam deferred"}        Extremities: Normal range of motion.  Edema: None  Mental Status: Normal mood and affect. Normal behavior. Normal judgment and thought content.   Assessment and Plan:  Pregnancy: G4P3003 at 376w3d  1. Supervision of high risk pregnancy, antepartum ***  2. Multigravida of advanced maternal age in third trimester ***  3. Language barrier ***  4. Diet controlled gestational diabetes mellitus (GDM) in third trimester ***  {Blank  single:19197::"Term","Preterm"} labor symptoms and general obstetric precautions including but not limited to vaginal bleeding, contractions, leaking of fluid and fetal movement were reviewed in detail with the patient. Please refer to After Visit Summary for other counseling recommendations.  No follow-ups on file.  Future Appointments  Date Time Provider Department Center  08/25/2017 11:00 AM [place] BingPickens, Charlie, MD [facility]    Raelyn Moraolitta Orella Cushman, CNM

## 2017-08-17 ENCOUNTER — Telehealth (HOSPITAL_COMMUNITY): Payer: Self-pay | Admitting: *Deleted

## 2017-08-17 NOTE — Telephone Encounter (Signed)
Preadmission screen Interpreter number [phone]

## 2017-08-18 ENCOUNTER — Encounter (HOSPITAL_COMMUNITY): Payer: Self-pay | Admitting: *Deleted

## 2017-08-18 ENCOUNTER — Encounter: Payer: Self-pay | Admitting: Obstetrics & Gynecology

## 2017-08-18 ENCOUNTER — Telehealth (HOSPITAL_COMMUNITY): Payer: Self-pay | Admitting: *Deleted

## 2017-08-18 NOTE — Telephone Encounter (Signed)
Preadmission screen Interpreter number [id number] 

## 2017-08-22 ENCOUNTER — Ambulatory Visit (INDEPENDENT_AMBULATORY_CARE_PROVIDER_SITE_OTHER): Payer: Self-pay | Admitting: Family Medicine

## 2017-08-22 VITALS — BP 115/71 | HR 72 | Wt 193.4 lb

## 2017-08-22 DIAGNOSIS — O99820 Streptococcus B carrier state complicating pregnancy: Secondary | ICD-10-CM

## 2017-08-22 DIAGNOSIS — O0990 Supervision of high risk pregnancy, unspecified, unspecified trimester: Secondary | ICD-10-CM

## 2017-08-22 DIAGNOSIS — O24410 Gestational diabetes mellitus in pregnancy, diet controlled: Secondary | ICD-10-CM

## 2017-08-22 NOTE — Progress Notes (Signed)
   PRENATAL VISIT NOTE  Subjective:  Joanna Whitney is a 37 y.o. [phone] at 7012w2d being seen today for ongoing prenatal care.  She is currently monitored for the following issues for this high-risk pregnancy and has Supervision of high risk pregnancy, antepartum; Advanced maternal age in multigravida; Obesity (BMI 30-39.9); Obesity in pregnancy; Language barrier; GDM (gestational diabetes mellitus); and GBS (group B Streptococcus carrier), +RV culture, currently pregnant on their problem list.  Patient reports no complaints.  Contractions: Not present. Vag. Bleeding: None.  Movement: Present. Denies leaking of fluid.   The following portions of the patient's history were reviewed and updated as appropriate: allergies, current medications, past family history, past medical history, past social history, past surgical history and problem list. Problem list updated.  Objective:   Vitals:   08/22/17 1149  BP: 115/71  Pulse: 72  Weight: 193 lb 6.4 oz (87.7 kg)    Fetal Status: Fetal Heart Rate (bpm): 134 Fundal Height: 38 cm Movement: Present  Presentation: Vertex  General:  Alert, oriented and cooperative. Patient is in no acute distress.  Skin: Skin is warm and dry. No rash noted.   Cardiovascular: Normal heart rate noted  Respiratory: Normal respiratory effort, no problems with respiration noted  Abdomen: Soft, gravid, appropriate for gestational age.  Pain/Pressure: Present     Pelvic: Cervical exam performed Dilation: Fingertip Effacement (%): 40 Station: Ballotable  Extremities: Normal range of motion.  Edema: None  Mental Status: Normal mood and affect. Normal behavior. Normal judgment and thought content.   Assessment and Plan:  Pregnancy: G4P3003 at 2212w2d  1. Diet controlled gestational diabetes mellitus (GDM) in third trimester FBS 83-92 2 hour PP 69-103 Continue diet For IOL this week, in office foley tomorrow.  2. Supervision of high risk pregnancy,  antepartum   3. GBS (group B Streptococcus carrier), +RV culture, currently pregnant Advised treatment in labor  Term labor symptoms and general obstetric precautions including but not limited to vaginal bleeding, contractions, leaking of fluid and fetal movement were reviewed in detail with the patient. Please refer to After Visit Summary for other counseling recommendations.  Return in 6 weeks (on 10/03/2017) for pp check,?foley outpatient tomorrow?.  Future Appointments  Date Time Provider Department Center  08/23/2017  3:15 PM Federico FlakeNewton, Kimberly Niles, MD [facility]  08/24/2017  7:30 AM [facility] RO[facility] None    Reva Boresanya S Pranay Hilbun, MD

## 2017-08-22 NOTE — Patient Instructions (Signed)
 Lactancia materna Breastfeeding Decidir amamantar es una de las mejores elecciones que puede hacer por usted y su beb. Un cambio en las hormonas durante el embarazo hace que las mamas produzcan leche materna en las glndulas productoras de leche. Las hormonas impiden que la leche materna sea liberada antes del nacimiento del beb. Adems, impulsan el flujo de leche luego del nacimiento. Una vez que ha comenzado a amamantar, pensar en el beb, as como la succin o el llanto, pueden estimular la liberacin de leche de las glndulas productoras de leche. Los beneficios de amamantar Las investigaciones demuestran que la lactancia materna ofrece muchos beneficios de salud para bebs y madres. Adems, ofrece una forma gratuita y conveniente de alimentar al beb. Para el beb  La primera leche (calostro) ayuda a mejorar el funcionamiento del aparato digestivo del beb.  Las clulas especiales de la leche (anticuerpos) ayudan a combatir las infecciones en el beb.  Los bebs que se alimentan con leche materna tambin tienen menos probabilidades de tener asma, alergias, obesidad o diabetes de tipo 2. Adems, tienen menor riesgo de sufrir el sndrome de muerte sbita del lactante (SMSL).  Los nutrientes de la leche materna son mejores para satisfacer las necesidades del beb en comparacin con la leche maternizada.  La leche materna mejora el desarrollo cerebral del beb. Para usted  La lactancia materna favorece el desarrollo de un vnculo muy especial entre la madre y el beb.  Es conveniente. La leche materna es econmica y siempre est disponible a la temperatura correcta.  La lactancia materna ayuda a quemar caloras. Le ayuda a perder el peso ganado durante el embarazo.  Hace que el tero vuelva al tamao que tena antes del embarazo ms rpido. Adems, disminuye el sangrado (loquios) despus del parto.  La lactancia materna contribuye a reducir el riesgo de tener diabetes de tipo 2,  osteoporosis, artritis reumatoide, enfermedades cardiovasculares y cncer de mama, ovario, tero y endometrio en el futuro. Informacin bsica sobre la lactancia Comienzo de la lactancia  Encuentre un lugar cmodo para sentarse o acostarse, con un buen respaldo para el cuello y la espalda.  Coloque una almohada o una manta enrollada debajo del beb para acomodarlo a la altura de la mama (si est sentada). Las almohadas para amamantar se han diseado especialmente a fin de servir de apoyo para los brazos y el beb mientras amamanta.  Asegrese de que la barriga del beb (abdomen) est frente a la suya.  Masajee suavemente la mama. Con las yemas de los dedos, masajee los bordes exteriores de la mama hacia adentro, en direccin al pezn. Esto estimula el flujo de leche. Si la leche fluye lentamente, es posible que deba continuar con este movimiento durante la lactancia.  Sostenga la mama con 4 dedos por debajo y el pulgar por arriba del pezn (forme la letra "C" con la mano). Asegrese de que los dedos se encuentren lejos del pezn y de la boca del beb.  Empuje suavemente los labios del beb con el pezn o con el dedo.  Cuando la boca del beb se abra lo suficiente, acrquelo rpidamente a la mama e introduzca todo el pezn y la arola, tanto como sea posible, dentro de la boca del beb. La arola es la zona de color que rodea al pezn. ? Debe haber ms arola visible por arriba del labio superior del beb que por debajo del labio inferior. ? Los labios del beb deben estar abiertos y extendidos hacia afuera (evertidos) para asegurar que   el beb se prenda de forma adecuada y cmoda. ? La lengua del beb debe estar entre la enca inferior y la mama.  Asegrese de que la boca del beb est en la posicin correcta alrededor del pezn (prendido). Los labios del beb deben crear un sello sobre la mama y estar doblados hacia afuera (invertidos).  Es comn que el beb succione durante 2 a 3 minutos  para que comience el flujo de leche materna. Cmo debe prenderse Es muy importante que le ensee al beb cmo prenderse adecuadamente a la mama. Si el beb no se prende adecuadamente, puede causar dolor en los pezones, reducir la produccin de leche materna y hacer que el beb tenga un escaso aumento de peso. Adems, si el beb no se prende adecuadamente al pezn, puede tragar aire durante la alimentacin. Esto puede causarle molestias al beb. Hacer eructar al beb al cambiar de mama puede ayudarlo a liberar el aire. Sin embargo, ensearle al beb cmo prenderse a la mama adecuadamente es la mejor manera de evitar que se sienta molesto por tragar aire mientras se alimenta. Signos de que el beb se ha prendido adecuadamente al pezn  Tironea o succiona de modo silencioso, sin causarle dolor. Los labios del beb deben estar extendidos hacia afuera (evertidos).  Se escucha que traga cada 3 o 4 succiones una vez que la leche ha comenzado a fluir (despus de que se produzca el reflejo de eyeccin de la leche).  Hay movimientos musculares por arriba y por delante de sus odos al succionar.  Signos de que el beb no se ha prendido adecuadamente al pezn  Hace ruidos de succin o de chasquido mientras se alimenta.  Siente dolor en los pezones.  Si cree que el beb no se prendi correctamente, deslice el dedo en la comisura de la boca y colquelo entre las encas del beb para interrumpir la succin. Intente volver a comenzar a amamantar. Signos de lactancia materna exitosa Signos del beb  El beb disminuir gradualmente el nmero de succiones o dejar de succionar por completo.  El beb se quedar dormido.  El cuerpo del beb se relajar.  El beb retendr una pequea cantidad de leche en la boca.  El beb se desprender solo del pecho.  Signos que presenta usted  Las mamas han aumentado la firmeza, el peso y el tamao 1 a 3 horas despus de amamantar.  Estn ms blandas inmediatamente  despus de amamantar.  Se producen un aumento del volumen de leche y un cambio en su consistencia y color hacia el quinto da de lactancia.  Los pezones no duelen, no estn agrietados ni sangran.  Signos de que su beb recibe la cantidad de leche suficiente  Mojar por lo menos 1 o 2paales durante las primeras 24horas despus del nacimiento.  Mojar por lo menos 5 o 6paales cada 24horas durante la primera semana despus del nacimiento. La orina debe ser clara o de color amarillo plido a los 5das de vida.  Mojar entre 6 y 8paales cada 24horas a medida que el beb sigue creciendo y desarrollndose.  Defeca por lo menos 3 veces en 24 horas a los 5 das de vida. Las heces deben ser blandas y amarillentas.  Defeca por lo menos 3 veces en 24 horas a los 7 das de vida. Las heces deben ser grumosas y amarillentas.  No registra una prdida de peso mayor al 10% del peso al nacer durante los primeros 3 das de vida.  Aumenta de peso un   promedio de 4 a 7onzas (113 a 198g) por semana despus de los 4 das de vida.  Aumenta de peso, diariamente, de manera uniforme a partir de los 5 das de vida, sin registrar prdida de peso despus de las 2semanas de vida. Despus de alimentarse, es posible que el beb regurgite una pequea cantidad de leche. Esto es normal. Frecuencia y duracin de la lactancia El amamantamiento frecuente la ayudar a producir ms leche y puede prevenir dolores en los pezones y las mamas extremadamente llenas (congestin mamaria). Alimente al beb cuando muestre signos de hambre o si siente la necesidad de reducir la congestin de las mamas. Esto se denomina "lactancia a demanda". Las seales de que el beb tiene hambre incluyen las siguientes:  Aumento del estado de alerta, actividad o inquietud.  Mueve la cabeza de un lado a otro.  Abre la boca cuando se le toca la mejilla o la comisura de la boca (reflejo de bsqueda).  Aumenta las vocalizaciones, tales como  sonidos de succin, se relame los labios, emite arrullos, suspiros o chirridos.  Mueve la mano hacia la boca y se chupa los dedos o las manos.  Est molesto o llora.  Evite el uso del chupete en las primeras 4 a 6 semanas despus del nacimiento del beb. Despus de este perodo, podr usar un chupete. Las investigaciones demostraron que el uso del chupete durante el primer ao de vida del beb disminuye el riesgo de tener el sndrome de muerte sbita del lactante (SMSL). Permita que el nio se alimente en cada mama todo lo que desee. Cuando el beb se desprende o se queda dormido mientras se est alimentando de la primera mama, ofrzcale la segunda. Debido a que, con frecuencia, los recin nacidos estn somnolientos las primeras semanas de vida, es posible que deba despertar al beb para alimentarlo. Los horarios de lactancia varan de un beb a otro. Sin embargo, las siguientes reglas pueden servir como gua para ayudarla a garantizar que el beb se alimenta adecuadamente:  Se puede amamantar a los recin nacidos (bebs de 4 semanas o menos de vida) cada 1 a 3 horas.  No deben transcurrir ms de 3 horas durante el da o 5 horas durante la noche sin que se amamante a los recin nacidos.  Debe amamantar al beb un mnimo de 8 veces en un perodo de 24 horas.  Extraccin de leche materna La extraccin y el almacenamiento de la leche materna le permiten asegurarse de que el beb se alimente exclusivamente de su leche materna, aun en momentos en los que no puede amamantar. Esto tiene especial importancia si debe regresar al trabajo en el perodo en que an est amamantando o si no puede estar presente en los momentos en que el beb debe alimentarse. Su asesor en lactancia puede ayudarla a encontrar un mtodo de extraccin que funcione mejor para usted y orientarla sobre cunto tiempo es seguro almacenar leche materna. Cmo cuidar las mamas durante la lactancia Los pezones pueden secarse, agrietarse y  doler durante la lactancia. Las siguientes recomendaciones pueden ayudarla a mantener las mamas humectadas y sanas:  Evite usar jabn en los pezones.  Use un sostn de soporte diseado especialmente para la lactancia materna. Evite usar sostenes con aro o sostenes muy ajustados (sostenes deportivos).  Seque al aire sus pezones durante 3 a 4minutos despus de amamantar al beb.  Utilice solo apsitos de algodn en el sostn para absorber las prdidas de leche. La prdida de un poco de leche materna   entre las tomas es normal.  Utilice lanolina sobre los pezones luego de amamantar. La lanolina ayuda a mantener la humedad normal de la piel. La lanolina pura no es perjudicial (no es txica) para el beb. Adems, puede extraer manualmente algunas gotas de leche materna y masajear suavemente esa leche sobre los pezones para que la leche se seque al aire.  Durante las primeras semanas despus del nacimiento, algunas mujeres experimentan congestin mamaria. La congestin mamaria puede hacer que sienta las mamas pesadas, calientes y sensibles al tacto. El pico de la congestin mamaria ocurre en el plazo de los 3 a 5 das despus del parto. Las siguientes recomendaciones pueden ayudarla a aliviar la congestin mamaria:  Vace por completo las mamas al amamantar o extraer leche. Puede aplicar calor hmedo en las mamas (en la ducha o con toallas hmedas para manos) antes de amamantar o extraer leche. Esto aumenta la circulacin y ayuda a que la leche fluya. Si el beb no vaca por completo las mamas cuando lo amamanta, extraiga la leche restante despus de que haya finalizado.  Aplique compresas de hielo sobre las mamas inmediatamente despus de amamantar o extraer leche, a menos que le resulte demasiado incmodo. Haga lo siguiente: ? Ponga el hielo en una bolsa plstica. ? Coloque una toalla entre la piel y la bolsa de hielo. ? Coloque el hielo durante 20minutos, 2 o 3veces por da.  Asegrese de que el  beb est prendido y se encuentre en la posicin correcta mientras lo alimenta.  Si la congestin mamaria persiste luego de 48 horas o despus de seguir estas recomendaciones, comunquese con su mdico o un asesor en lactancia. Recomendaciones de salud general durante la lactancia  Consuma 3 comidas y 3 colaciones saludables todos los das. Las madres bien alimentadas que amamantan necesitan entre 450 y 500 caloras adicionales por da. Puede cumplir con este requisito al aumentar la cantidad de una dieta equilibrada que realice.  Beba suficiente agua para mantener la orina clara o de color amarillo plido.  Descanse con frecuencia, reljese y siga tomando sus vitaminas prenatales para prevenir la fatiga, el estrs y los niveles bajos de vitaminas y minerales en el cuerpo (deficiencias de nutrientes).  No consuma ningn producto que contenga nicotina o tabaco, como cigarrillos y cigarrillos electrnicos. El beb puede verse afectado por las sustancias qumicas de los cigarrillos que pasan a la leche materna y por la exposicin al humo ambiental del tabaco. Si necesita ayuda para dejar de fumar, consulte al mdico.  Evite el consumo de alcohol.  No consuma drogas ilegales o marihuana.  Antes de usar cualquier medicamento, hable con el mdico. Estos incluyen medicamentos recetados y de venta libre, como tambin vitaminas y suplementos a base de hierbas. Algunos medicamentos, que pueden ser perjudiciales para el beb, pueden pasar a travs de la leche materna.  Puede quedar embarazada durante la lactancia. Si se desea un mtodo anticonceptivo, consulte al mdico sobre cules son las opciones seguras durante la lactancia. Dnde encontrar ms informacin: Liga internacional La Leche: www.llli.org. Comunquese con un mdico si:  Siente que quiere dejar de amamantar o se siente frustrada con la lactancia.  Sus pezones estn agrietados o sangran.  Sus mamas estn irritadas, sensibles o  calientes.  Tiene los siguientes sntomas: ? Dolor en las mamas o en los pezones. ? Un rea hinchada en cualquiera de las mamas. ? Fiebre o escalofros. ? Nuseas o vmitos. ? Drenaje de otro lquido distinto de la leche materna desde los pezones.    Sus mamas no se llenan antes de amamantar al beb para el quinto da despus del parto.  Se siente triste y deprimida.  El beb: ? Est demasiado somnoliento como para comer bien. ? Tiene problemas para dormir. ? Tiene ms de 1 semana de vida y moja menos de 6 paales en un periodo de 24 horas. ? No ha aumentado de peso a los 5 das de vida.  El beb defeca menos de 3 veces en 24 horas.  La piel del beb o las partes blancas de los ojos se vuelven amarillentas. Solicite ayuda de inmediato si:  El beb est muy cansado (letargo) y no se quiere despertar para comer.  Le sube la fiebre sin causa. Resumen  La lactancia materna ofrece muchos beneficios de salud para bebs y madres.  Intente amamantar a su beb cuando muestre signos tempranos de hambre.  Haga cosquillas o empuje suavemente los labios del beb con el dedo o el pezn para lograr que el beb abra la boca. Acerque el beb a la mama. Asegrese de que la mayor parte de la arola se encuentre dentro de la boca del beb. Ofrzcale una mama y haga eructar al beb antes de pasar a la otra.  Hable con su mdico o asesor en lactancia si tiene dudas o problemas con la lactancia. Esta informacin no tiene como fin reemplazar el consejo del mdico. Asegrese de hacerle al mdico cualquier pregunta que tenga. Document Released: 01/10/2005 Document Revised: 05/02/2016 Document Reviewed: 05/02/2016 Elsevier Interactive Patient Education  2018 [employer].  

## 2017-08-22 NOTE — Progress Notes (Signed)
gbs pos

## 2017-08-23 ENCOUNTER — Encounter: Payer: Self-pay | Admitting: Family Medicine

## 2017-08-23 ENCOUNTER — Telehealth: Payer: Self-pay

## 2017-08-23 NOTE — Telephone Encounter (Signed)
Spoke with patient regarding baby scripts and the importance of checking blood sugars. Patient is currently 5165w3d

## 2017-08-24 ENCOUNTER — Inpatient Hospital Stay (HOSPITAL_COMMUNITY)
Admission: RE | Admit: 2017-08-24 | Discharge: 2017-08-24 | Disposition: A | Discharge status: Home / Self Care | Payer: Self-pay | Source: Ambulatory Visit | Attending: Obstetrics & Gynecology | Admitting: Obstetrics & Gynecology

## 2017-08-25 ENCOUNTER — Encounter: Payer: Self-pay | Admitting: Obstetrics and Gynecology

## 2017-08-29 ENCOUNTER — Ambulatory Visit (INDEPENDENT_AMBULATORY_CARE_PROVIDER_SITE_OTHER): Payer: Self-pay | Admitting: Family Medicine

## 2017-08-29 VITALS — BP 115/71 | HR 73 | Wt 193.8 lb

## 2017-08-29 DIAGNOSIS — O24410 Gestational diabetes mellitus in pregnancy, diet controlled: Secondary | ICD-10-CM

## 2017-08-29 DIAGNOSIS — O0993 Supervision of high risk pregnancy, unspecified, third trimester: Secondary | ICD-10-CM

## 2017-08-29 DIAGNOSIS — O99820 Streptococcus B carrier state complicating pregnancy: Secondary | ICD-10-CM

## 2017-08-29 DIAGNOSIS — O0990 Supervision of high risk pregnancy, unspecified, unspecified trimester: Secondary | ICD-10-CM

## 2017-08-29 NOTE — Progress Notes (Signed)
Blood Glucose Reading:                  Fasting        Breakfast        Lunch       Dinner 08/01     95                    98                  96            107 08/02     93                    98                  95             101 08/03     76                    92                  98             104  08/05     86 08/06     89                   92                   98

## 2017-08-29 NOTE — Patient Instructions (Signed)
 Lactancia materna Breastfeeding Decidir amamantar es una de las mejores elecciones que puede hacer por usted y su beb. Un cambio en las hormonas durante el embarazo hace que las mamas produzcan leche materna en las glndulas productoras de leche. Las hormonas impiden que la leche materna sea liberada antes del nacimiento del beb. Adems, impulsan el flujo de leche luego del nacimiento. Una vez que ha comenzado a amamantar, pensar en el beb, as como la succin o el llanto, pueden estimular la liberacin de leche de las glndulas productoras de leche. Los beneficios de amamantar Las investigaciones demuestran que la lactancia materna ofrece muchos beneficios de salud para bebs y madres. Adems, ofrece una forma gratuita y conveniente de alimentar al beb. Para el beb  La primera leche (calostro) ayuda a mejorar el funcionamiento del aparato digestivo del beb.  Las clulas especiales de la leche (anticuerpos) ayudan a combatir las infecciones en el beb.  Los bebs que se alimentan con leche materna tambin tienen menos probabilidades de tener asma, alergias, obesidad o diabetes de tipo 2. Adems, tienen menor riesgo de sufrir el sndrome de muerte sbita del lactante (SMSL).  Los nutrientes de la leche materna son mejores para satisfacer las necesidades del beb en comparacin con la leche maternizada.  La leche materna mejora el desarrollo cerebral del beb. Para usted  La lactancia materna favorece el desarrollo de un vnculo muy especial entre la madre y el beb.  Es conveniente. La leche materna es econmica y siempre est disponible a la temperatura correcta.  La lactancia materna ayuda a quemar caloras. Le ayuda a perder el peso ganado durante el embarazo.  Hace que el tero vuelva al tamao que tena antes del embarazo ms rpido. Adems, disminuye el sangrado (loquios) despus del parto.  La lactancia materna contribuye a reducir el riesgo de tener diabetes de tipo 2,  osteoporosis, artritis reumatoide, enfermedades cardiovasculares y cncer de mama, ovario, tero y endometrio en el futuro. Informacin bsica sobre la lactancia Comienzo de la lactancia  Encuentre un lugar cmodo para sentarse o acostarse, con un buen respaldo para el cuello y la espalda.  Coloque una almohada o una manta enrollada debajo del beb para acomodarlo a la altura de la mama (si est sentada). Las almohadas para amamantar se han diseado especialmente a fin de servir de apoyo para los brazos y el beb mientras amamanta.  Asegrese de que la barriga del beb (abdomen) est frente a la suya.  Masajee suavemente la mama. Con las yemas de los dedos, masajee los bordes exteriores de la mama hacia adentro, en direccin al pezn. Esto estimula el flujo de leche. Si la leche fluye lentamente, es posible que deba continuar con este movimiento durante la lactancia.  Sostenga la mama con 4 dedos por debajo y el pulgar por arriba del pezn (forme la letra "C" con la mano). Asegrese de que los dedos se encuentren lejos del pezn y de la boca del beb.  Empuje suavemente los labios del beb con el pezn o con el dedo.  Cuando la boca del beb se abra lo suficiente, acrquelo rpidamente a la mama e introduzca todo el pezn y la arola, tanto como sea posible, dentro de la boca del beb. La arola es la zona de color que rodea al pezn. ? Debe haber ms arola visible por arriba del labio superior del beb que por debajo del labio inferior. ? Los labios del beb deben estar abiertos y extendidos hacia afuera (evertidos) para asegurar que   el beb se prenda de forma adecuada y cmoda. ? La lengua del beb debe estar entre la enca inferior y la mama.  Asegrese de que la boca del beb est en la posicin correcta alrededor del pezn (prendido). Los labios del beb deben crear un sello sobre la mama y estar doblados hacia afuera (invertidos).  Es comn que el beb succione durante 2 a 3 minutos  para que comience el flujo de leche materna. Cmo debe prenderse Es muy importante que le ensee al beb cmo prenderse adecuadamente a la mama. Si el beb no se prende adecuadamente, puede causar dolor en los pezones, reducir la produccin de leche materna y hacer que el beb tenga un escaso aumento de peso. Adems, si el beb no se prende adecuadamente al pezn, puede tragar aire durante la alimentacin. Esto puede causarle molestias al beb. Hacer eructar al beb al cambiar de mama puede ayudarlo a liberar el aire. Sin embargo, ensearle al beb cmo prenderse a la mama adecuadamente es la mejor manera de evitar que se sienta molesto por tragar aire mientras se alimenta. Signos de que el beb se ha prendido adecuadamente al pezn  Tironea o succiona de modo silencioso, sin causarle dolor. Los labios del beb deben estar extendidos hacia afuera (evertidos).  Se escucha que traga cada 3 o 4 succiones una vez que la leche ha comenzado a fluir (despus de que se produzca el reflejo de eyeccin de la leche).  Hay movimientos musculares por arriba y por delante de sus odos al succionar.  Signos de que el beb no se ha prendido adecuadamente al pezn  Hace ruidos de succin o de chasquido mientras se alimenta.  Siente dolor en los pezones.  Si cree que el beb no se prendi correctamente, deslice el dedo en la comisura de la boca y colquelo entre las encas del beb para interrumpir la succin. Intente volver a comenzar a amamantar. Signos de lactancia materna exitosa Signos del beb  El beb disminuir gradualmente el nmero de succiones o dejar de succionar por completo.  El beb se quedar dormido.  El cuerpo del beb se relajar.  El beb retendr una pequea cantidad de leche en la boca.  El beb se desprender solo del pecho.  Signos que presenta usted  Las mamas han aumentado la firmeza, el peso y el tamao 1 a 3 horas despus de amamantar.  Estn ms blandas inmediatamente  despus de amamantar.  Se producen un aumento del volumen de leche y un cambio en su consistencia y color hacia el quinto da de lactancia.  Los pezones no duelen, no estn agrietados ni sangran.  Signos de que su beb recibe la cantidad de leche suficiente  Mojar por lo menos 1 o 2paales durante las primeras 24horas despus del nacimiento.  Mojar por lo menos 5 o 6paales cada 24horas durante la primera semana despus del nacimiento. La orina debe ser clara o de color amarillo plido a los 5das de vida.  Mojar entre 6 y 8paales cada 24horas a medida que el beb sigue creciendo y desarrollndose.  Defeca por lo menos 3 veces en 24 horas a los 5 das de vida. Las heces deben ser blandas y amarillentas.  Defeca por lo menos 3 veces en 24 horas a los 7 das de vida. Las heces deben ser grumosas y amarillentas.  No registra una prdida de peso mayor al 10% del peso al nacer durante los primeros 3 das de vida.  Aumenta de peso un   promedio de 4 a 7onzas (113 a 198g) por semana despus de los 4 das de vida.  Aumenta de peso, diariamente, de manera uniforme a partir de los 5 das de vida, sin registrar prdida de peso despus de las 2semanas de vida. Despus de alimentarse, es posible que el beb regurgite una pequea cantidad de leche. Esto es normal. Frecuencia y duracin de la lactancia El amamantamiento frecuente la ayudar a producir ms leche y puede prevenir dolores en los pezones y las mamas extremadamente llenas (congestin mamaria). Alimente al beb cuando muestre signos de hambre o si siente la necesidad de reducir la congestin de las mamas. Esto se denomina "lactancia a demanda". Las seales de que el beb tiene hambre incluyen las siguientes:  Aumento del estado de alerta, actividad o inquietud.  Mueve la cabeza de un lado a otro.  Abre la boca cuando se le toca la mejilla o la comisura de la boca (reflejo de bsqueda).  Aumenta las vocalizaciones, tales como  sonidos de succin, se relame los labios, emite arrullos, suspiros o chirridos.  Mueve la mano hacia la boca y se chupa los dedos o las manos.  Est molesto o llora.  Evite el uso del chupete en las primeras 4 a 6 semanas despus del nacimiento del beb. Despus de este perodo, podr usar un chupete. Las investigaciones demostraron que el uso del chupete durante el primer ao de vida del beb disminuye el riesgo de tener el sndrome de muerte sbita del lactante (SMSL). Permita que el nio se alimente en cada mama todo lo que desee. Cuando el beb se desprende o se queda dormido mientras se est alimentando de la primera mama, ofrzcale la segunda. Debido a que, con frecuencia, los recin nacidos estn somnolientos las primeras semanas de vida, es posible que deba despertar al beb para alimentarlo. Los horarios de lactancia varan de un beb a otro. Sin embargo, las siguientes reglas pueden servir como gua para ayudarla a garantizar que el beb se alimenta adecuadamente:  Se puede amamantar a los recin nacidos (bebs de 4 semanas o menos de vida) cada 1 a 3 horas.  No deben transcurrir ms de 3 horas durante el da o 5 horas durante la noche sin que se amamante a los recin nacidos.  Debe amamantar al beb un mnimo de 8 veces en un perodo de 24 horas.  Extraccin de leche materna La extraccin y el almacenamiento de la leche materna le permiten asegurarse de que el beb se alimente exclusivamente de su leche materna, aun en momentos en los que no puede amamantar. Esto tiene especial importancia si debe regresar al trabajo en el perodo en que an est amamantando o si no puede estar presente en los momentos en que el beb debe alimentarse. Su asesor en lactancia puede ayudarla a encontrar un mtodo de extraccin que funcione mejor para usted y orientarla sobre cunto tiempo es seguro almacenar leche materna. Cmo cuidar las mamas durante la lactancia Los pezones pueden secarse, agrietarse y  doler durante la lactancia. Las siguientes recomendaciones pueden ayudarla a mantener las mamas humectadas y sanas:  Evite usar jabn en los pezones.  Use un sostn de soporte diseado especialmente para la lactancia materna. Evite usar sostenes con aro o sostenes muy ajustados (sostenes deportivos).  Seque al aire sus pezones durante 3 a 4minutos despus de amamantar al beb.  Utilice solo apsitos de algodn en el sostn para absorber las prdidas de leche. La prdida de un poco de leche materna   entre las tomas es normal.  Utilice lanolina sobre los pezones luego de amamantar. La lanolina ayuda a mantener la humedad normal de la piel. La lanolina pura no es perjudicial (no es txica) para el beb. Adems, puede extraer manualmente algunas gotas de leche materna y masajear suavemente esa leche sobre los pezones para que la leche se seque al aire.  Durante las primeras semanas despus del nacimiento, algunas mujeres experimentan congestin mamaria. La congestin mamaria puede hacer que sienta las mamas pesadas, calientes y sensibles al tacto. El pico de la congestin mamaria ocurre en el plazo de los 3 a 5 das despus del parto. Las siguientes recomendaciones pueden ayudarla a aliviar la congestin mamaria:  Vace por completo las mamas al amamantar o extraer leche. Puede aplicar calor hmedo en las mamas (en la ducha o con toallas hmedas para manos) antes de amamantar o extraer leche. Esto aumenta la circulacin y ayuda a que la leche fluya. Si el beb no vaca por completo las mamas cuando lo amamanta, extraiga la leche restante despus de que haya finalizado.  Aplique compresas de hielo sobre las mamas inmediatamente despus de amamantar o extraer leche, a menos que le resulte demasiado incmodo. Haga lo siguiente: ? Ponga el hielo en una bolsa plstica. ? Coloque una toalla entre la piel y la bolsa de hielo. ? Coloque el hielo durante 20minutos, 2 o 3veces por da.  Asegrese de que el  beb est prendido y se encuentre en la posicin correcta mientras lo alimenta.  Si la congestin mamaria persiste luego de 48 horas o despus de seguir estas recomendaciones, comunquese con su mdico o un asesor en lactancia. Recomendaciones de salud general durante la lactancia  Consuma 3 comidas y 3 colaciones saludables todos los das. Las madres bien alimentadas que amamantan necesitan entre 450 y 500 caloras adicionales por da. Puede cumplir con este requisito al aumentar la cantidad de una dieta equilibrada que realice.  Beba suficiente agua para mantener la orina clara o de color amarillo plido.  Descanse con frecuencia, reljese y siga tomando sus vitaminas prenatales para prevenir la fatiga, el estrs y los niveles bajos de vitaminas y minerales en el cuerpo (deficiencias de nutrientes).  No consuma ningn producto que contenga nicotina o tabaco, como cigarrillos y cigarrillos electrnicos. El beb puede verse afectado por las sustancias qumicas de los cigarrillos que pasan a la leche materna y por la exposicin al humo ambiental del tabaco. Si necesita ayuda para dejar de fumar, consulte al mdico.  Evite el consumo de alcohol.  No consuma drogas ilegales o marihuana.  Antes de usar cualquier medicamento, hable con el mdico. Estos incluyen medicamentos recetados y de venta libre, como tambin vitaminas y suplementos a base de hierbas. Algunos medicamentos, que pueden ser perjudiciales para el beb, pueden pasar a travs de la leche materna.  Puede quedar embarazada durante la lactancia. Si se desea un mtodo anticonceptivo, consulte al mdico sobre cules son las opciones seguras durante la lactancia. Dnde encontrar ms informacin: Liga internacional La Leche: www.llli.org. Comunquese con un mdico si:  Siente que quiere dejar de amamantar o se siente frustrada con la lactancia.  Sus pezones estn agrietados o sangran.  Sus mamas estn irritadas, sensibles o  calientes.  Tiene los siguientes sntomas: ? Dolor en las mamas o en los pezones. ? Un rea hinchada en cualquiera de las mamas. ? Fiebre o escalofros. ? Nuseas o vmitos. ? Drenaje de otro lquido distinto de la leche materna desde los pezones.    Sus mamas no se llenan antes de amamantar al beb para el quinto da despus del parto.  Se siente triste y deprimida.  El beb: ? Est demasiado somnoliento como para comer bien. ? Tiene problemas para dormir. ? Tiene ms de 1 semana de vida y moja menos de 6 paales en un periodo de 24 horas. ? No ha aumentado de peso a los 5 das de vida.  El beb defeca menos de 3 veces en 24 horas.  La piel del beb o las partes blancas de los ojos se vuelven amarillentas. Solicite ayuda de inmediato si:  El beb est muy cansado (letargo) y no se quiere despertar para comer.  Le sube la fiebre sin causa. Resumen  La lactancia materna ofrece muchos beneficios de salud para bebs y madres.  Intente amamantar a su beb cuando muestre signos tempranos de hambre.  Haga cosquillas o empuje suavemente los labios del beb con el dedo o el pezn para lograr que el beb abra la boca. Acerque el beb a la mama. Asegrese de que la mayor parte de la arola se encuentre dentro de la boca del beb. Ofrzcale una mama y haga eructar al beb antes de pasar a la otra.  Hable con su mdico o asesor en lactancia si tiene dudas o problemas con la lactancia. Esta informacin no tiene como fin reemplazar el consejo del mdico. Asegrese de hacerle al mdico cualquier pregunta que tenga. Document Released: 01/10/2005 Document Revised: 05/02/2016 Document Reviewed: 05/02/2016 Elsevier Interactive Patient Education  2018 [employer].  

## 2017-08-30 NOTE — Progress Notes (Signed)
   PRENATAL VISIT NOTE  Subjective:  Joanna Whitney is a 37 y.o. [phone] at 5075w3d being seen today for ongoing prenatal care.  She is currently monitored for the following issues for this high-risk pregnancy and has Supervision of high risk pregnancy, antepartum; Advanced maternal age in multigravida; Obesity (BMI 30-39.9); Obesity in pregnancy; Language barrier; GDM (gestational diabetes mellitus); and GBS (group B Streptococcus carrier), +RV culture, currently pregnant on their problem list.  Patient reports no complaints.  Contractions: Irregular. Vag. Bleeding: Scant.  Movement: Present. Denies leaking of fluid.   The following portions of the patient's history were reviewed and updated as appropriate: allergies, current medications, past family history, past medical history, past social history, past surgical history and problem list. Problem list updated.  Objective:   Vitals:   08/29/17 1537  BP: 115/71  Pulse: 73  Weight: 193 lb 12.8 oz (87.9 kg)    Fetal Status: Fetal Heart Rate (bpm): 140 Fundal Height: 38 cm Movement: Present  Presentation: Vertex  General:  Alert, oriented and cooperative. Patient is in no acute distress.  Skin: Skin is warm and dry. No rash noted.   Cardiovascular: Normal heart rate noted  Respiratory: Normal respiratory effort, no problems with respiration noted  Abdomen: Soft, gravid, appropriate for gestational age.  Pain/Pressure: Present     Pelvic: Cervical exam performed Dilation: 2 Effacement (%): 70 Station: -3  Extremities: Normal range of motion.  Edema: Trace  Mental Status: Normal mood and affect. Normal behavior. Normal judgment and thought content.   Assessment and Plan:  Pregnancy: G4P3003 at 6375w3d 1. Diet controlled gestational diabetes mellitus (GDM) in third trimester CBGs are well controlled--continue diet  IOL at 40 wks  2. GBS (group B Streptococcus carrier), +RV culture, currently pregnant Will need treament in  labor  3. Supervision of high risk pregnancy, antepartum Continue prenatal care. Membranes stripped today    Term labor symptoms and general obstetric precautions including but not limited to vaginal bleeding, contractions, leaking of fluid and fetal movement were reviewed in detail with the patient. Please refer to After Visit Summary for other counseling recommendations.  No follow-ups on file.  Future Appointments  Date Time Provider Department Center  09/03/2017  7:30 AM [facility] RO[facility] None  10/17/2017  9:00 AM Reva BoresPratt, Awanda Wilcock S, MD [facility]    Reva Boresanya S Jacqueline Delapena, MD

## 2017-09-02 ENCOUNTER — Inpatient Hospital Stay (HOSPITAL_COMMUNITY): Payer: Medicaid Other | Admitting: Anesthesiology

## 2017-09-02 ENCOUNTER — Encounter (HOSPITAL_COMMUNITY): Payer: Self-pay

## 2017-09-02 ENCOUNTER — Inpatient Hospital Stay (HOSPITAL_COMMUNITY)
Admission: AD | Admit: 2017-09-02 | Discharge: 2017-09-04 | DRG: 807 | Disposition: A | Discharge status: Home / Self Care | Payer: Medicaid Other | Attending: Family Medicine | Admitting: Family Medicine

## 2017-09-02 DIAGNOSIS — Z3483 Encounter for supervision of other normal pregnancy, third trimester: Secondary | ICD-10-CM | POA: Diagnosis present

## 2017-09-02 DIAGNOSIS — O99824 Streptococcus B carrier state complicating childbirth: Secondary | ICD-10-CM | POA: Diagnosis present

## 2017-09-02 DIAGNOSIS — O99214 Obesity complicating childbirth: Secondary | ICD-10-CM | POA: Diagnosis present

## 2017-09-02 DIAGNOSIS — O0990 Supervision of high risk pregnancy, unspecified, unspecified trimester: Secondary | ICD-10-CM

## 2017-09-02 DIAGNOSIS — O24429 Gestational diabetes mellitus in childbirth, unspecified control: Secondary | ICD-10-CM

## 2017-09-02 DIAGNOSIS — Z3A39 39 weeks gestation of pregnancy: Secondary | ICD-10-CM

## 2017-09-02 DIAGNOSIS — O99820 Streptococcus B carrier state complicating pregnancy: Secondary | ICD-10-CM

## 2017-09-02 DIAGNOSIS — O24420 Gestational diabetes mellitus in childbirth, diet controlled: Principal | ICD-10-CM | POA: Diagnosis present

## 2017-09-02 DIAGNOSIS — E669 Obesity, unspecified: Secondary | ICD-10-CM | POA: Diagnosis present

## 2017-09-02 DIAGNOSIS — O09523 Supervision of elderly multigravida, third trimester: Secondary | ICD-10-CM

## 2017-09-02 LAB — CBC
HCT: 36.4 % (ref 36.0–46.0)
Hemoglobin: 12.6 g/dL (ref 12.0–15.0)
MCH: 31.7 pg (ref 26.0–34.0)
MCHC: 34.6 g/dL (ref 30.0–36.0)
MCV: 91.5 fL (ref 78.0–100.0)
PLATELETS: 210 10*3/uL (ref 150–400)
RBC: 3.98 MIL/uL (ref 3.87–5.11)
RDW: 15.1 % (ref 11.5–15.5)
WBC: 12.8 10*3/uL — AB (ref 4.0–10.5)

## 2017-09-02 LAB — GLUCOSE, CAPILLARY
Glucose-Capillary: 101 mg/dL — ABNORMAL HIGH (ref 70–99)
Glucose-Capillary: 96 mg/dL (ref 70–99)
Glucose-Capillary: 94 mg/dL (ref 70–99)

## 2017-09-02 LAB — TYPE AND SCREEN
ABO/RH(D): O POS
ANTIBODY SCREEN: NEGATIVE

## 2017-09-02 LAB — RPR: RPR: NONREACTIVE

## 2017-09-02 MED ORDER — SENNOSIDES-DOCUSATE SODIUM 8.6-50 MG PO TABS
2.0000 | ORAL_TABLET | ORAL | Status: DC
  Administered 2017-09-03 – 2017-09-04 (×2): 2 via ORAL
  Filled 2017-09-02 (×2): qty 2

## 2017-09-02 MED ORDER — TERBUTALINE SULFATE 1 MG/ML IJ SOLN
0.2500 mg | Freq: Once | INTRAMUSCULAR | Status: DC | PRN
  Filled 2017-09-02: qty 1

## 2017-09-02 MED ORDER — LACTATED RINGERS IV SOLN: 500.0000 mL | Freq: Once | INTRAVENOUS | Status: DC

## 2017-09-02 MED ORDER — OXYTOCIN 40 UNITS IN LACTATED RINGERS INFUSION - SIMPLE MED: 1.0000 m[IU]/min | INTRAVENOUS | Status: DC

## 2017-09-02 MED ORDER — OXYCODONE HCL 5 MG PO TABS: 5.0000 mg | ORAL_TABLET | ORAL | Status: DC | PRN

## 2017-09-02 MED ORDER — EPHEDRINE 5 MG/ML INJ
10.0000 mg | INTRAVENOUS | Status: DC | PRN
  Filled 2017-09-02: qty 2

## 2017-09-02 MED ORDER — FENTANYL 2.5 MCG/ML BUPIVACAINE 1/10 % EPIDURAL INFUSION (WH - ANES)
14.0000 mL/h | INTRAMUSCULAR | Status: DC | PRN
  Administered 2017-09-02: 14 mL/h via EPIDURAL

## 2017-09-02 MED ORDER — FENTANYL 2.5 MCG/ML BUPIVACAINE 1/10 % EPIDURAL INFUSION (WH - ANES)
INTRAMUSCULAR | Status: AC
  Filled 2017-09-02: qty 100

## 2017-09-02 MED ORDER — IBUPROFEN 600 MG PO TABS
600.0000 mg | ORAL_TABLET | Freq: Four times a day (QID) | ORAL | Status: DC
  Administered 2017-09-02 – 2017-09-04 (×9): 600 mg via ORAL
  Filled 2017-09-02 (×9): qty 1

## 2017-09-02 MED ORDER — DIPHENHYDRAMINE HCL 50 MG/ML IJ SOLN: 12.5000 mg | INTRAMUSCULAR | Status: DC | PRN

## 2017-09-02 MED ORDER — DIBUCAINE 1 % RE OINT: 1.0000 "application " | TOPICAL_OINTMENT | RECTAL | Status: DC | PRN

## 2017-09-02 MED ORDER — FENTANYL CITRATE (PF) 100 MCG/2ML IJ SOLN: 100.0000 ug | INTRAMUSCULAR | Status: DC | PRN

## 2017-09-02 MED ORDER — PHENYLEPHRINE 40 MCG/ML (10ML) SYRINGE FOR IV PUSH (FOR BLOOD PRESSURE SUPPORT)
80.0000 ug | PREFILLED_SYRINGE | INTRAVENOUS | Status: DC | PRN
  Filled 2017-09-02: qty 5

## 2017-09-02 MED ORDER — ACETAMINOPHEN 325 MG PO TABS: 650.0000 mg | ORAL_TABLET | ORAL | Status: DC | PRN

## 2017-09-02 MED ORDER — SIMETHICONE 80 MG PO CHEW: 80.0000 mg | CHEWABLE_TABLET | ORAL | Status: DC | PRN

## 2017-09-02 MED ORDER — LACTATED RINGERS IV SOLN: 500.0000 mL | INTRAVENOUS | Status: DC | PRN

## 2017-09-02 MED ORDER — PENICILLIN G 3 MILLION UNITS IVPB - SIMPLE MED
3.0000 10*6.[IU] | INTRAVENOUS | Status: DC
  Filled 2017-09-02 (×2): qty 100

## 2017-09-02 MED ORDER — OXYCODONE-ACETAMINOPHEN 5-325 MG PO TABS: 2.0000 | ORAL_TABLET | ORAL | Status: DC | PRN

## 2017-09-02 MED ORDER — OXYCODONE-ACETAMINOPHEN 5-325 MG PO TABS: 1.0000 | ORAL_TABLET | ORAL | Status: DC | PRN

## 2017-09-02 MED ORDER — BENZOCAINE-MENTHOL 20-0.5 % EX AERO: 1.0000 "application " | INHALATION_SPRAY | CUTANEOUS | Status: DC | PRN

## 2017-09-02 MED ORDER — LIDOCAINE HCL (PF) 1 % IJ SOLN
INTRAMUSCULAR | Status: DC | PRN
  Administered 2017-09-02 (×2): 4 mL via EPIDURAL

## 2017-09-02 MED ORDER — PRENATAL MULTIVITAMIN CH
1.0000 | ORAL_TABLET | Freq: Every day | ORAL | Status: DC
  Administered 2017-09-02 – 2017-09-04 (×3): 1 via ORAL
  Filled 2017-09-02 (×3): qty 1

## 2017-09-02 MED ORDER — ZOLPIDEM TARTRATE 5 MG PO TABS: 5.0000 mg | ORAL_TABLET | Freq: Every evening | ORAL | Status: DC | PRN

## 2017-09-02 MED ORDER — COCONUT OIL OIL: 1.0000 "application " | TOPICAL_OIL | Status: DC | PRN

## 2017-09-02 MED ORDER — ONDANSETRON HCL 4 MG/2ML IJ SOLN: 4.0000 mg | INTRAMUSCULAR | Status: DC | PRN

## 2017-09-02 MED ORDER — LIDOCAINE HCL (PF) 1 % IJ SOLN
30.0000 mL | INTRAMUSCULAR | Status: DC | PRN
  Filled 2017-09-02: qty 30

## 2017-09-02 MED ORDER — SOD CITRATE-CITRIC ACID 500-334 MG/5ML PO SOLN: 30.0000 mL | ORAL | Status: DC | PRN

## 2017-09-02 MED ORDER — DIPHENHYDRAMINE HCL 25 MG PO CAPS: 25.0000 mg | ORAL_CAPSULE | Freq: Four times a day (QID) | ORAL | Status: DC | PRN

## 2017-09-02 MED ORDER — OXYTOCIN 10 UNIT/ML IJ SOLN
10.0000 [IU] | Freq: Once | INTRAMUSCULAR | Status: DC | PRN
  Filled 2017-09-02: qty 1

## 2017-09-02 MED ORDER — OXYTOCIN 40 UNITS IN LACTATED RINGERS INFUSION - SIMPLE MED
2.5000 [IU]/h | INTRAVENOUS | Status: DC
  Filled 2017-09-02: qty 1000

## 2017-09-02 MED ORDER — LACTATED RINGERS IV SOLN: INTRAVENOUS | Status: DC

## 2017-09-02 MED ORDER — OXYTOCIN BOLUS FROM INFUSION
500.0000 mL | Freq: Once | INTRAVENOUS | Status: AC
  Administered 2017-09-02: 500 mL via INTRAVENOUS

## 2017-09-02 MED ORDER — WITCH HAZEL-GLYCERIN EX PADS: 1.0000 "application " | MEDICATED_PAD | CUTANEOUS | Status: DC | PRN

## 2017-09-02 MED ORDER — PHENYLEPHRINE 40 MCG/ML (10ML) SYRINGE FOR IV PUSH (FOR BLOOD PRESSURE SUPPORT)
PREFILLED_SYRINGE | INTRAVENOUS | Status: AC
  Filled 2017-09-02: qty 10

## 2017-09-02 MED ORDER — SODIUM CHLORIDE 0.9 % IV SOLN
5.0000 10*6.[IU] | Freq: Once | INTRAVENOUS | Status: AC
  Administered 2017-09-02: 5 10*6.[IU] via INTRAVENOUS
  Filled 2017-09-02: qty 5

## 2017-09-02 MED ORDER — ONDANSETRON HCL 4 MG PO TABS: 4.0000 mg | ORAL_TABLET | ORAL | Status: DC | PRN

## 2017-09-02 MED ORDER — TETANUS-DIPHTH-ACELL PERTUSSIS 5-2.5-18.5 LF-MCG/0.5 IM SUSP: 0.5000 mL | Freq: Once | INTRAMUSCULAR | Status: DC

## 2017-09-02 NOTE — Anesthesia Postprocedure Evaluation (Signed)
Anesthesia Post Note  Patient: Joanna DrownMaria Whitney  Procedure(s) Performed: AN AD HOC LABOR EPIDURAL     Patient location during evaluation: Mother Baby Anesthesia Type: Epidural Level of consciousness: awake Pain management: satisfactory to patient Vital Signs Assessment: post-procedure vital signs reviewed and stable Respiratory status: spontaneous breathing Cardiovascular status: stable Anesthetic complications: no    Last Vitals:  Vitals:   09/02/17 1000 09/02/17 1355  BP: (!) 99/58 (!) 97/56  Pulse: 78 75  Resp:  16  Temp: 37.1 C 36.8 C  SpO2: 99%     Last Pain:  Vitals:   09/02/17 1355  TempSrc: Oral  PainSc:    Pain Goal: Patients Stated Pain Goal: 3 (09/02/17 0900)               [name],[name]

## 2017-09-02 NOTE — H&P (Addendum)
OBSTETRIC ADMISSION HISTORY AND PHYSICAL  Natania Finigan is a 37 y.o. female [phone] with IUP at 109w6dby 7 wk U/S presenting for SOL. She started feeling contractions at midnight. In the MAU her contractions were 4-5 minutes apart and painful.  She reports +FMs, No LOF, minimal VB, no blurry vision, headaches or peripheral edema, no RUQ pain. Sh has A1GDM and only took an aspirin during pregnancy, which she said was to prevent pre-eclampsia.  She had an induction scheduled for tomorrow. She plans on bottle feeding. She request pills for birth control. She received her prenatal care at [facility]  '@[redacted]w[redacted]d'$    Prenatal History/Complications: [id number] AMA, GBS positive  Past Medical History: Past Medical History:  Diagnosis Date  . Gestational diabetes   . Hx of toxoplasmosis   . Medical history non-contributory     Past Surgical History: Past Surgical History:  Procedure Laterality Date  . NO PAST SURGERIES      Obstetrical History: OB History    Gravida  4   Para  3   Term  3   Preterm      AB      Living  3     SAB      TAB      Ectopic      Multiple  0   Live Births  3           Social History: Social History   Socioeconomic History  . Marital status: Single    Spouse name: Not on file  . Number of children: Not on file  . Years of education: Not on file  . Highest education level: Not on file  Occupational History  . Not on file  Social Needs  . Financial resource strain: Not on file  . Food insecurity:    Worry: Not on file    Inability: Not on file  . Transportation needs:    Medical: Not on file    Non-medical: Not on file  Tobacco Use  . Smoking status: Never Smoker  . Smokeless tobacco: Never Used  Substance and Sexual Activity  . Alcohol use: No  . Drug use: No  . Sexual activity: Yes  Lifestyle  . Physical activity:    Days per week: Not on file    Minutes per session: Not on file  . Stress: Not on file   Relationships  . Social connections:    Talks on phone: Not on file    Gets together: Not on file    Attends religious service: Not on file    Active member of club or organization: Not on file    Attends meetings of clubs or organizations: Not on file    Relationship status: Not on file  Other Topics Concern  . Not on file  Social History Narrative  . Not on file    Family History: Family History  Problem Relation Age of Onset  . Diabetes Paternal Uncle   . Hypertension Mother     Allergies: No Known Allergies  Medications Prior to Admission  Medication Sig Dispense Refill Last Dose  . aspirin EC 81 MG tablet Take 1 tablet (81 mg total) by mouth daily. 60 tablet 3 09/01/2017 at Unknown time  . Prenatal Vit-Fe Fumarate-FA (PREPLUS) 27-1 MG TABS Take 1 tablet by mouth daily. 30 tablet 13 09/01/2017 at Unknown time  . ACCU-CHEK FASTCLIX LANCETS MISC 1 Units by Percutaneous route 4 (four) times daily. 100 each 12 Taking  .  Blood Glucose Monitoring Suppl (ACCU-CHEK NANO SMARTVIEW) w/Device KIT 1 kit by Subdermal route as directed. Check blood sugars for fasting, and two hours after breakfast, lunch and dinner (4 checks daily) 1 kit 0 Taking  . glucose blood (ACCU-CHEK SMARTVIEW) test strip Use as instructed to check blood sugars 100 each 12 Taking     Review of Systems   All systems reviewed and negative except as stated in HPI  Blood pressure (!) 96/51, pulse (S) 81, temperature 98.5 F (36.9 C), temperature source Oral, resp. rate 18, height '5\' 3"'$  (1.6 m), weight 88.5 kg, last menstrual period 11/27/2016, SpO2 100 %, unknown if currently breastfeeding. General appearance: alert, cooperative and moderate distress Lungs: clear to auscultation bilaterally Heart: regular rate and rhythm Abdomen: gravid, non-tender; bowel sounds normal Pelvic: white vaginal discharge Extremities: bilateral tenderness to palpation in calves.  Presentation: cephalic Fetal monitoringBaseline: 150  bpm, Variability: Good {> 6 bpm), Accelerations: Reactive and Decelerations: Variable: moderate Uterine activityNone, Date/time of onset: midnight, Frequency: Every 3-4 minutes and Intensity: strong Dilation: 8 Effacement (%): 90 Station: 0 Exam by:: Dr. Jeannine Kitten   Prenatal labs: ABO, Rh: --/--/O POS (08/10 4235) Antibody: NEG (08/10 0417) Rubella: 1.62 (01/14 1512) RPR: Non Reactive (05/16 0818)  HBsAg: Negative (01/14 1512)  HIV: Non Reactive (05/16 0818)  GBS: Positive (07/12 0000)  1 hr Glucola 86/188/163 Genetic screening  declined Anatomy US normal - 04/12/17  Prenatal Transfer Tool  Maternal Diabetes: Yes:  Diabetes Type:  Diet controlled Genetic Screening: Declined Maternal Ultrasounds/Referrals: Normal Fetal Ultrasounds or other Referrals:  None Maternal Substance Abuse:  No Significant Maternal Medications:  None Significant Maternal Lab Results: Lab values include: Group B Strep positive  Results for orders placed or performed during the hospital encounter of 09/02/17 (from the past 24 hour(s))  Type and screen [place]   Collection Time: 09/02/17  4:17 AM  Result Value Ref Range   ABO/RH(D) O POS    Antibody Screen NEG    Sample Expiration      09/05/2017 Performed at [facility], [address], [place], [place] [address]   CBC   Collection Time: 09/02/17  4:19 AM  Result Value Ref Range   WBC 12.8 (H) 4.0 - 10.5 K/uL   RBC 3.98 3.87 - 5.11 MIL/uL   Hemoglobin 12.6 12.0 - 15.0 g/dL   HCT 36.4 36.0 - 46.0 %   MCV 91.5 78.0 - 100.0 fL   MCH 31.7 26.0 - 34.0 pg   MCHC 34.6 30.0 - 36.0 g/dL   RDW 15.1 11.5 - 15.5 %   Platelets 210 150 - 400 K/uL  Glucose, capillary   Collection Time: 09/02/17  4:51 AM  Result Value Ref Range   Glucose-Capillary 101 (H) 70 - 99 mg/dL    Patient Active Problem List   Diagnosis Date Noted  . Indication for care in labor or delivery 09/02/2017  . GBS (group B Streptococcus carrier), +RV culture,  currently pregnant 08/08/2017  . GDM (gestational diabetes mellitus) 06/13/2017  . Obesity (BMI 30-39.9) 03/09/2017  . Obesity in pregnancy 03/09/2017  . Language barrier 03/09/2017  . Supervision of high risk pregnancy, antepartum 02/06/2017  . Advanced maternal age in multigravida 02/06/2017    Assessment/Plan:  Masae Lukacs is a 37 y.o. [phone] at 15w6dhere for sudden onset of labor.  Progressing rapidly.  Expect delivery soon.  GBS pos and A1GDM, well contolled.   #Labor:expectant mgmt A1gdm - q4h CBG #Pain: epidural #FWB: Cat. I #ID:  GBS pos - PCN #MOF: bottle #MOC:pills   Benay Pike, MD  09/02/2017, 5:21 AM  CNM attestation:  I have seen and examined this patient; I agree with above documentation in the resident's note.   Milla Wahlberg is a 37 y.o. [phone] here for SOL  PE: BP (!) 93/58   Pulse 77   Temp 98.7 F (37.1 C) (Oral)   Resp 18   Ht '5\' 3"'$  (1.6 m)   Wt 88.5 kg   LMP 11/27/2016   SpO2 100%   Breastfeeding? Unknown   BMI 34.54 kg/m   Resp: normal effort, no distress Abd: gravid  ROS, labs, PMH reviewed  Plan: Admit to [facility] Expectant management PCN for GBS ppx Anticipate SVD  Serita Grammes CNM 09/02/2017, 9:24 AM

## 2017-09-02 NOTE — Anesthesia Preprocedure Evaluation (Signed)
Anesthesia Evaluation  Patient identified by MRN, date of birth, ID band Patient awake    Reviewed: Allergy & Precautions, Patient's Chart, lab work & pertinent test results  Airway Mallampati: III  TM Distance: >3 FB Neck ROM: Full    Dental no notable dental hx. (+) Teeth Intact   Pulmonary neg pulmonary ROS,    Pulmonary exam normal breath sounds clear to auscultation       Cardiovascular negative cardio ROS Normal cardiovascular exam Rhythm:Regular Rate:Normal     Neuro/Psych negative neurological ROS  negative psych ROS   GI/Hepatic Neg liver ROS, GERD  ,  Endo/Other  diabetes, Well Controlled, GestationalObesity  Renal/GU negative Renal ROS  negative genitourinary   Musculoskeletal negative musculoskeletal ROS (+)   Abdominal (+) + obese,   Peds  Hematology negative hematology ROS (+)   Anesthesia Other Findings   Reproductive/Obstetrics (+) Pregnancy                             Anesthesia Physical Anesthesia Plan  ASA: II  Anesthesia Plan: Epidural   Post-op Pain Management:    Induction:   PONV Risk Score and Plan:   Airway Management Planned: Natural Airway  Additional Equipment:   Intra-op Plan:   Post-operative Plan:   Informed Consent: I have reviewed the patients History and Physical, chart, labs and discussed the procedure including the risks, benefits and alternatives for the proposed anesthesia with the patient or authorized representative who has indicated his/her understanding and acceptance.     Plan Discussed with: Anesthesiologist  Anesthesia Plan Comments:         Anesthesia Quick Evaluation

## 2017-09-02 NOTE — Anesthesia Procedure Notes (Signed)
Epidural Patient location during procedure: OB Start time: 09/02/2017 5:01 AM  Staffing Anesthesiologist: Mal AmabileFoster, Sergio Hobart, MD Performed: anesthesiologist   Preanesthetic Checklist Completed: patient identified, site marked, surgical consent, pre-op evaluation, timeout performed, IV checked, risks and benefits discussed and monitors and equipment checked  Epidural Patient position: sitting Prep: site prepped and draped and DuraPrep Patient monitoring: continuous pulse ox and blood pressure Approach: midline Location: L3-L4 Injection technique: LOR air  Needle:  Needle type: Tuohy  Needle gauge: 17 G Needle length: 9 cm and 9 Needle insertion depth: 5 cm cm Catheter type: closed end flexible Catheter size: 19 Gauge Catheter at skin depth: 10 cm Test dose: negative and Other  Assessment Events: blood not aspirated, injection not painful, no injection resistance, negative IV test and no paresthesia  Additional Notes Patient identified. Risks and benefits discussed including failed block, incomplete  Pain control, post dural puncture headache, nerve damage, paralysis, blood pressure Changes, nausea, vomiting, reactions to medications-both toxic and allergic and post Partum back pain. All questions were answered. Patient expressed understanding and wished to proceed. Sterile technique was used throughout procedure. Epidural site was Dressed with sterile barrier dressing. No paresthesias, signs of intravascular injection Or signs of intrathecal spread were encountered.  Patient was more comfortable after the epidural was dosed. Please see RN's note for documentation of vital signs and FHR which are stable.

## 2017-09-02 NOTE — MAU Note (Addendum)
Pt reports contractions every 5 mins since midnight. Denies LOF or vag bleeding. Reports good fetal movement. States cervix was 2cm on Thursday. Interpretor offered to patient and patient denies at this time.

## 2017-09-02 NOTE — MAU Note (Signed)
Pt MD, recheck patient in 1 hour

## 2017-09-03 ENCOUNTER — Inpatient Hospital Stay (HOSPITAL_COMMUNITY): Admission: RE | Admit: 2017-09-03 | Payer: Self-pay | Source: Ambulatory Visit

## 2017-09-03 LAB — GLUCOSE, CAPILLARY
GLUCOSE-CAPILLARY: 74 mg/dL (ref 70–99)
Glucose-Capillary: 78 mg/dL (ref 70–99)

## 2017-09-03 NOTE — Progress Notes (Signed)
Blood glucose value 91 at 2124. Meter was docked but did not show up in chart.

## 2017-09-03 NOTE — Progress Notes (Signed)
Post Partum Day 1 Subjective: no complaints, up ad lib, voiding and tolerating PO  Objective: Blood pressure (!) 95/51, pulse 67, temperature 97.7 F (36.5 C), temperature source Oral, resp. rate 16, height 5\' 3"  (1.6 m), weight 88.5 kg, last menstrual period 11/27/2016, SpO2 99 %, unknown if currently breastfeeding.  Physical Exam:  General: alert, cooperative, appears stated age and no distress Lochia: appropriate Uterine Fundus: firm Incision: n/a DVT Evaluation: No evidence of DVT seen on physical exam.  Recent Labs    09/02/17 0419  HGB 12.6  HCT 36.4    Assessment/Plan: Plan for discharge tomorrow   LOS: 1 day   Wyvonnia DuskyMarie Kuron Docken 09/03/2017, 9:15 AM

## 2017-09-04 LAB — GLUCOSE, CAPILLARY: GLUCOSE-CAPILLARY: 91 mg/dL (ref 70–99)

## 2017-09-04 MED ORDER — IBUPROFEN 600 MG PO TABS: 600.0000 mg | ORAL_TABLET | Freq: Four times a day (QID) | ORAL | 0 refills | Status: DC

## 2017-09-04 MED ORDER — SENNOSIDES-DOCUSATE SODIUM 8.6-50 MG PO TABS: 2.0000 | ORAL_TABLET | ORAL | 0 refills | Status: DC

## 2017-09-04 NOTE — Discharge Instructions (Signed)
Postpartum Care After Vaginal Delivery °The period of time right after you deliver your newborn is called the postpartum period. °What kind of medical care will I receive? °· You may continue to receive fluids and medicines through an IV tube inserted into one of your veins. °· If an incision was made near your vagina (episiotomy) or if you had some vaginal tearing during delivery, cold compresses may be placed on your episiotomy or your tear. This helps to reduce pain and swelling. °· You may be given a squirt bottle to use when you go to the bathroom. You may use this until you are comfortable wiping as usual. To use the squirt bottle, follow these steps: °? Before you urinate, fill the squirt bottle with warm water. Do not use hot water. °? After you urinate, while you are sitting on the toilet, use the squirt bottle to rinse the area around your urethra and vaginal opening. This rinses away any urine and blood. °? You may do this instead of wiping. As you start healing, you may use the squirt bottle before wiping yourself. Make sure to wipe gently. °? Fill the squirt bottle with clean water every time you use the bathroom. °· You will be given sanitary pads to wear. °How can I expect to feel? °· You may not feel the need to urinate for several hours after delivery. °· You will have some soreness and pain in your abdomen and vagina. °· If you are breastfeeding, you may have uterine contractions every time you breastfeed for up to several weeks postpartum. Uterine contractions help your uterus return to its normal size. °· It is normal to have vaginal bleeding (lochia) after delivery. The amount and appearance of lochia is often similar to a menstrual period in the first week after delivery. It will gradually decrease over the next few weeks to a dry, yellow-brown discharge. For most women, lochia stops completely by 6-8 weeks after delivery. Vaginal bleeding can vary from woman to woman. °· Within the first few  days after delivery, you may have breast engorgement. This is when your breasts feel heavy, full, and uncomfortable. Your breasts may also throb and feel hard, tightly stretched, warm, and tender. After this occurs, you may have milk leaking from your breasts. Your health care provider can help you relieve discomfort due to breast engorgement. Breast engorgement should go away within a few days. °· You may feel more sad or worried than normal due to hormonal changes after delivery. These feelings should not last more than a few days. If these feelings do not go away after several days, speak with your health care provider. °How should I care for myself? °· Tell your health care provider if you have pain or discomfort. °· Drink enough water to keep your urine clear or pale yellow. °· Wash your hands thoroughly with soap and water for at least 20 seconds after changing your sanitary pads, after using the toilet, and before holding or feeding your baby. °· If you are not breastfeeding, avoid touching your breasts a lot. Doing this can make your breasts produce more milk. °· If you become weak or lightheaded, or you feel like you might faint, ask for help before: °? Getting out of bed. °? Showering. °· Change your sanitary pads frequently. Watch for any changes in your flow, such as a sudden increase in volume, a change in color, the passing of large blood clots. If you pass a blood clot from your vagina, save it   to show to your health care provider. Do not flush blood clots down the toilet without having your health care provider look at them. °· Make sure that all your vaccinations are up to date. This can help protect you and your baby from getting certain diseases. You may need to have immunizations done before you leave the hospital. °· If desired, talk with your health care provider about methods of family planning or birth control (contraception). °How can I start bonding with my baby? °Spending as much time as  possible with your baby is very important. During this time, you and your baby can get to know each other and develop a bond. Having your baby stay with you in your room (rooming in) can give you time to get to know your baby. Rooming in can also help you become comfortable caring for your baby. Breastfeeding can also help you bond with your baby. °How can I plan for returning home with my baby? °· Make sure that you have a car seat installed in your vehicle. °? Your car seat should be checked by a certified car seat installer to make sure that it is installed safely. °? Make sure that your baby fits into the car seat safely. °· Ask your health care provider any questions you have about caring for yourself or your baby. Make sure that you are able to contact your health care provider with any questions after leaving the hospital. °This information is not intended to replace advice given to you by your health care provider. Make sure you discuss any questions you have with your health care provider. °Document Released: 11/07/2006 Document Revised: 06/15/2015 Document Reviewed: 12/15/2014 °Elsevier Interactive Patient Education © 2018 [employer] ° °

## 2017-09-04 NOTE — Discharge Summary (Addendum)
OB Discharge Summary     Patient Name: Joanna DrownMaria Whitney DOB: Mar 16, 1980 MRN: [id number]  Date of admission: 09/02/2017 Delivering MD: Lenor CoffinLSON, DANIEL K   Date of discharge: 09/04/2017  Admitting diagnosis: 39wks ctx Intrauterine pregnancy: 1854w6d     Secondary diagnosis:  Active Problems:   Indication for care in labor or delivery  Additional problems: A1GDM, AMA     Discharge diagnosis: Term Pregnancy Delivered and GDM A1                                                                                                Post partum procedures:n/a  Augmentation: none  Complications: None  Hospital course:  Onset of Labor With Vaginal Delivery     37 y.o. yo [id number] at 4254w6d was admitted in Active Labor on 09/02/2017. Patient had an uncomplicated labor course as follows:  Membrane Rupture Time/Date: 5:13 AM ,09/02/2017   Intrapartum Procedures: Episiotomy: None [1]                                         Lacerations:  None [1]  Patient had a delivery of a Viable infant. 09/02/2017  Information for the patient's newborn:  Joanna Whitney, Girl Byrd HesselbachMaria [[id number]][[id number]]  Delivery Method: Vaginal, Spontaneous(Filed from Delivery Summary)    Pateint had an uncomplicated postpartum course.  She is ambulating, tolerating a regular diet, passing flatus, and urinating well. Patient is discharged home in stable condition on 09/04/17.   Physical exam  Vitals:   09/03/17 0638 09/03/17 1502 09/03/17 2140 09/04/17 0633  BP: (!) 95/51 (!) 95/53 (!) 109/56 (!) 98/56  Pulse: 67 66 66 66  Resp: 16 18  18   Temp: 97.7 F (36.5 C) 97.7 F (36.5 C) 98.5 F (36.9 C) 98.2 F (36.8 C)  TempSrc: Oral Oral Oral Oral  SpO2: 99%  99%   Weight:      Height:       General: alert, cooperative and no distress Lochia: appropriate Uterine Fundus: firm Incision: N/A DVT Evaluation: No evidence of DVT seen on physical exam. Labs: Lab Results  Component Value Date   WBC 12.8 (H) 09/02/2017   HGB 12.6  09/02/2017   HCT 36.4 09/02/2017   MCV 91.5 09/02/2017   PLT 210 09/02/2017   CMP Latest Ref Rng & Units 02/06/2017  Glucose 65 - 99 mg/dL 88  BUN 6 - 20 mg/dL 10  Creatinine 1.300.57 - 8.651.00 mg/dL 7.84(O0.43(L)  Sodium 962134 - 952144 mmol/L 138  Potassium 3.5 - 5.2 mmol/L 3.9  Chloride 96 - 106 mmol/L 101  CO2 20 - 29 mmol/L 22  Calcium 8.7 - 10.2 mg/dL 9.0  Total Protein 6.0 - 8.5 g/dL 6.9  Total Bilirubin 0.0 - 1.2 mg/dL <8.4<0.2  Alkaline Phos 39 - 117 IU/L 83  AST 0 - 40 IU/L 12  ALT 0 - 32 IU/L 10    Discharge instruction: per After Visit Summary and "Baby and Me Booklet".  After visit meds:  Allergies as of 09/04/2017  No Known Allergies     Medication List    STOP taking these medications   aspirin EC 81 MG tablet     TAKE these medications   ibuprofen 600 MG tablet Commonly known as:  ADVIL,MOTRIN Take 1 tablet (600 mg total) by mouth every 6 (six) hours.   PREPLUS 27-1 MG Tabs Take 1 tablet by mouth daily.   senna-docusate 8.6-50 MG tablet Commonly known as:  Senokot-S Take 2 tablets by mouth daily. Start taking on:  09/05/2017       Diet: routine diet  Activity: Advance as tolerated. Pelvic rest for 6 weeks.   Outpatient follow up:4 weeks.   Follow up Appt: Future Appointments  Date Time Provider Department Center  10/17/2017  9:00 AM Reva BoresPratt, Tanya S, MD [facility]   Follow up Visit:No follow-ups on file.  Postpartum contraception: Progesterone only pills  Newborn Data: Live born female  Birth Weight: 7 lb 3.2 oz (3266 g) APGAR: 9, 9  Newborn Delivery   Birth date/time:  09/02/2017 07:15:00 Delivery type:  Vaginal, Spontaneous     Baby Feeding: Bottle Disposition:home with mother   09/04/2017 Joanna Kittyaniel K Olson, MD  RESIDENT ADDENDUM I have separately seen and examined the patient. I have discussed the findings and exam with the medical student and agree with the above note. I helped develop the management plan that is described in the  student's note, and I agree with the content.  Clayton BiblesSamantha Earlene Bjelland, CNM 09/04/17  1400

## 2017-10-16 NOTE — Progress Notes (Signed)
Post Partum Exam  Joanna DrownMaria Whitney is a 37 y.o. [id number] female who presents for a postpartum visit. She is 6 weeks postpartum following a spontaneous vaginal delivery. I have fully reviewed the prenatal and intrapartum course. The delivery was at 39 gestational weeks.  Anesthesia: epidural. Postpartum course has been unremarkable. Baby's course has been unremarkable. Baby is feeding by bottle - Similac ProAdvance. Bleeding begin menstrual cycle 10/15/17.. Bowel function is normal. Bladder function is normal. Patient is not sexually active. Contraception method is Nexplanon. Postpartum depression screening:neg I have reviewed above and agree with this documentation.  The following portions of the patient's history were reviewed and updated as appropriate: allergies, current medications, past family history, past medical history, past social history, past surgical history and problem list.  Review of Systems Pertinent items noted in HPI and remainder of comprehensive ROS otherwise negative.    Objective:  Blood pressure 102/69, pulse 71, resp. rate 16, height 5\' 4"  (1.626 m), weight 174 lb 9.6 oz (79.2 kg), last menstrual period 10/15/2017, not currently breastfeeding.  General:  alert, cooperative and appears stated age  Lungs: normal effort  Heart:  regular rate and rhythm  Abdomen: soft, non-tender; bowel sounds normal; no masses,  no organomegaly        Assessment:    Nml postpartum exam. Pap smear done at today's visit.   Plan:   1. [facility] 2. Pap due 01/2020 3. Follow up in: 2 weeks for GDM testing--not fasting today or as needed.

## 2017-10-17 ENCOUNTER — Ambulatory Visit (INDEPENDENT_AMBULATORY_CARE_PROVIDER_SITE_OTHER): Payer: Self-pay | Admitting: Family Medicine

## 2017-10-17 ENCOUNTER — Encounter: Payer: Self-pay | Admitting: Family Medicine

## 2017-10-17 VITALS — BP 102/69 | HR 71 | Resp 16 | Ht 64.0 in | Wt 174.6 lb

## 2017-10-17 DIAGNOSIS — O99815 Abnormal glucose complicating the puerperium: Secondary | ICD-10-CM

## 2017-10-17 NOTE — Patient Instructions (Signed)
Etonogestrel implant Qu es este medicamento? El ETONOGESTREL es un dispositivo anticonceptivo (control de la natalidad). Se usa para evitar los embarazos. Se puede usar hasta por 3 aos. Este medicamento puede ser utilizado para otros usos; si tiene alguna pregunta consulte con su proveedor de atencin mdica o con su farmacutico. MARCAS COMUNES: Implanon, Nexplanon Qu le debo informar a mi profesional de la salud antes de tomar este medicamento? Necesita saber si usted presenta alguno de los siguientes problemas o situaciones: sangrado vaginal anormal enfermedad vascular o cogulos sanguneos cncer de mama, cervical, heptico depresin diabetes enfermedad de la vescula biliar dolores de cabeza enfermedad cardiaca o ataque cardiaco reciente alta presin sangunea alto nivel de colesterol enfermedad renal enfermedad heptica convulsiones fuma tabaco una reaccin alrgica o inusual al etonogestrel, otras hormonas, anestsicos o antispticos, medicamentos, alimentos, colorantes o conservantes si est embarazada o buscando quedar embarazada si est amamantando a un beb Cmo debo utilizar este medicamento? Un profesional de la salud inserta este dispositivo justo debajo de la piel en la parte interior del brazo. Hable con su pediatra para informarse acerca del uso de este medicamento en nios. Puede requerir atencin especial. Sobredosis: Pngase en contacto inmediatamente con un centro toxicolgico o una sala de urgencia si usted cree que haya tomado demasiado medicamento. ATENCIN: Este medicamento es solo para usted. No comparta este medicamento con nadie. Qu sucede si me olvido de una dosis? No se aplica en este caso. Qu puede interactuar con este medicamento? No tome este medicamento con ninguno de los siguientes frmacos: amprenavir bosentano fosamprenavir Este medicamento tambin podra interactuar con los siguientes medicamentos: barbitricos para inducir el sueo o para el  tratamiento de convulsiones ciertos medicamentos para infecciones micticas, tales como itraconazol y ketoconazol jugo de toronja griseofulvina medicamentos para tratar convulsiones, tales como carbamazepina, felbamato, oxcarbazepina, fenitona, topiramato modafinilo fenilbutazona rifampicina rufinamida algunos medicamentos para tratar la infeccin por el VIH, tales como atazanavir, indinavir, lopinavir, nelfinavir, tipranavir, ritonavir hierba de [place] Puede ser que esta lista no menciona todas las posibles interacciones. Informe a su profesional de la salud de todos los productos a base de hierbas, medicamentos de venta libre o suplementos nutritivos que est tomando. Si usted fuma, consume bebidas alcohlicas o si utiliza drogas ilegales, indqueselo tambin a su profesional de la salud. Algunas sustancias pueden interactuar con su medicamento. A qu debo estar atento al usar este medicamento? Este producto no protege contra la infeccin por el VIH (SIDA) u otras enfermedades de transmisin sexual. Usted debe sentir el implante al presionar con las yemas de los dedos sobre la piel donde se insert. Contacte a su mdico si no se siente el implante y usa un mtodo anticonceptivo no hormonal (como el condn) hasta que el mdico confirma que el implante est en su lugar. Si siente que el implante puede haber roto o doblado en su brazo, pngase en contacto con su proveedor de atencin mdica. Qu efectos secundarios puedo tener al utilizar este medicamento? Efectos secundarios que debe informar a su mdico o a su profesional de la salud tan pronto como sea posible: reacciones alrgicas, como erupcin cutnea, picazn o urticarias, e hinchazn de la cara, los labios o la lengua bultos en las mamas cambios en las emociones o el estado de nimo estado de nimo deprimido sangrado menstrual intenso o prolongado dolor, irritacin, hinchazn o moretones en el lugar de la insercin cicatriz en el lugar de la  insercin signos de infeccin en el sitio de insercin tales como fiebre, y enrojecimiento de   la piel, dolor o secrecin signos de embarazo signos y sntomas de un cogulo sanguneo, tales como problemas respiratorios; cambios en la visin; dolor en el pecho; dolor de cabeza severo, repentino; dolor, hinchazn, calor en la pierna; dificultad para hablar; entumecimiento o debilidad repentina de la cara, el brazo o la pierna signos y sntomas de lesin al hgado, como orina amarilla oscura o marrn; sensacin general de estar enfermo o sntomas gripales; heces claras; prdida de apetito; nuseas; dolor en la regin abdominal superior derecha; cansancio o debilidad inusual; color amarillento de los ojos o la piel sangrado vaginal inusual, secrecin signos y sntomas de un derrame cerebral, tales como cambios en la visin; confusin; dificultad para hablar o entender; dolores de cabeza severos; entumecimiento o debilidad repentina de la cara, el brazo o la pierna; problemas al caminar; mareo; prdida del equilibrio o la coordinacin Efectos secundarios que generalmente no requieren atencin mdica (infrmelos a su mdico o a su profesional de la salud si persisten o si son molestos): acn dolor de espalda dolor en las mamas cambios de peso mareos sensacin general de estar enfermo o sntomas gripales dolor de cabeza sangrado menstrual irregular nuseas dolor de garganta irritacin o inflamacin vaginal Puede ser que esta lista no menciona todos los posibles efectos secundarios. Comunquese a su mdico por asesoramiento mdico sobre los efectos secundarios. Usted puede informar los efectos secundarios a la FDA por telfono al 1-800-FDA-1088. Dnde debo guardar mi medicina? Este medicamento se administra en hospitales o clnicas y no necesitar guardarlo en su domicilio. ATENCIN: Este folleto es un resumen. Puede ser que no cubra toda la posible informacin. Si usted tiene preguntas acerca de esta medicina,  consulte con su mdico, su farmacutico o su profesional de la salud.  2018 Elsevier/Gold Standard (2016-02-11 00:00:00)  

## 2017-10-27 ENCOUNTER — Other Ambulatory Visit: Payer: Self-pay

## 2017-10-27 DIAGNOSIS — O24410 Gestational diabetes mellitus in pregnancy, diet controlled: Secondary | ICD-10-CM

## 2017-10-27 NOTE — Addendum Note (Signed)
Addended by: Dianah Field on: 10/27/2017 08:05 AM   Modules accepted: Orders

## 2017-10-28 LAB — GLUCOSE TOLERANCE, 2 HOURS
Glucose, 2 hour: 77 mg/dL (ref 65–139)
Glucose, GTT - Fasting: 77 mg/dL (ref 65–99)

## 2020-01-07 ENCOUNTER — Encounter (HOSPITAL_COMMUNITY): Payer: Self-pay

## 2020-01-07 ENCOUNTER — Other Ambulatory Visit: Payer: Self-pay

## 2020-01-07 ENCOUNTER — Emergency Department (HOSPITAL_COMMUNITY)
Admission: EM | Admit: 2020-01-07 | Discharge: 2020-01-08 | Disposition: A | Discharge status: Home / Self Care | Payer: Self-pay | Attending: Emergency Medicine | Admitting: Emergency Medicine

## 2020-01-07 DIAGNOSIS — R42 Dizziness and giddiness: Secondary | ICD-10-CM | POA: Insufficient documentation

## 2020-01-07 DIAGNOSIS — I959 Hypotension, unspecified: Secondary | ICD-10-CM | POA: Insufficient documentation

## 2020-01-07 DIAGNOSIS — R55 Syncope and collapse: Secondary | ICD-10-CM | POA: Insufficient documentation

## 2020-01-07 DIAGNOSIS — R110 Nausea: Secondary | ICD-10-CM | POA: Insufficient documentation

## 2020-01-07 LAB — URINALYSIS, ROUTINE W REFLEX MICROSCOPIC
Bilirubin Urine: NEGATIVE
Glucose, UA: NEGATIVE mg/dL
Hgb urine dipstick: NEGATIVE
Ketones, ur: NEGATIVE mg/dL
Leukocytes,Ua: NEGATIVE
Nitrite: NEGATIVE
Protein, ur: NEGATIVE mg/dL
Specific Gravity, Urine: 1.008 (ref 1.005–1.030)
pH: 6 (ref 5.0–8.0)

## 2020-01-07 LAB — BASIC METABOLIC PANEL
Anion gap: 9 (ref 5–15)
BUN: 10 mg/dL (ref 6–20)
CO2: 23 mmol/L (ref 22–32)
Calcium: 9.1 mg/dL (ref 8.9–10.3)
Chloride: 104 mmol/L (ref 98–111)
Creatinine, Ser: 0.65 mg/dL (ref 0.44–1.00)
GFR, Estimated: >60 mL/min (ref >60)
Glucose, Bld: 121 mg/dL — ABNORMAL HIGH (ref 70–99)
Potassium: 3.5 mmol/L (ref 3.5–5.1)
Sodium: 136 mmol/L (ref 135–145)

## 2020-01-07 LAB — CBC
HCT: 37.2 % (ref 36.0–46.0)
Hemoglobin: 12.9 g/dL (ref 12.0–15.0)
MCH: 31.7 pg (ref 26.0–34.0)
MCHC: 34.7 g/dL (ref 30.0–36.0)
MCV: 91.4 fL (ref 80.0–100.0)
Platelets: 263 10*3/uL (ref 150–400)
RBC: 4.07 MIL/uL (ref 3.87–5.11)
RDW: 13 % (ref 11.5–15.5)
WBC: 10.3 10*3/uL (ref 4.0–10.5)
nRBC: 0 % (ref 0.0–0.2)

## 2020-01-07 LAB — I-STAT BETA HCG BLOOD, ED (MC, WL, AP ONLY): I-stat hCG, quantitative: <5 m[IU]/mL (ref <5)

## 2020-01-07 NOTE — ED Triage Notes (Signed)
Pt from home with CC of hypotension, states she feels like her legs have "been close to giving out." Denies hx, medication use, endorses nausea but no vomiting or diarrhea. Pt states sx started around 2pm and states she "feels cold." Pt also states she has not eaten much for fear of stomach upset.

## 2020-01-08 MED ORDER — SODIUM CHLORIDE 0.9 % IV BOLUS
1000.0000 mL | Freq: Once | INTRAVENOUS | Status: AC
  Administered 2020-01-08: 1000 mL via INTRAVENOUS

## 2020-01-08 MED ORDER — ONDANSETRON HCL 4 MG/2ML IJ SOLN
4.0000 mg | Freq: Once | INTRAMUSCULAR | Status: AC
  Administered 2020-01-08: 4 mg via INTRAVENOUS
  Filled 2020-01-08: qty 2

## 2020-01-08 MED ORDER — ONDANSETRON 4 MG PO TBDP: 4.0000 mg | ORAL_TABLET | Freq: Three times a day (TID) | ORAL | 0 refills | Status: DC | PRN

## 2020-01-08 NOTE — ED Provider Notes (Signed)
[facility] EMERGENCY DEPARTMENT Provider Note   CSN: [id number] Arrival date & time: 01/07/20  1848     History Chief Complaint  Patient presents with  . Hypotension  . Near Syncope    Joanna Whitney is a 39 y.o. female.  The history is provided by the patient and medical records.  Near Syncope   39 y.o. F with hx of pregnancy related issues only, presenting to the ED with concern of possible hypotension.  States yesterday around 2PM she started feeling poorly-- states she felt cold, has had some intermittent nausea, with poor oral intake.  States when she gets nauseated she starts feeling weak/lightheaded to the point where if she stood up she may pass out.  She has not had any falls or syncopal events.   She denies noted fever, cough, vomiting, diarrhea.  No chest pain or SOB. Denies sick contacts or covid exposures.  States she has tried to increase water intake but still cannot eat very much.  States symptoms made her very anxious and just wanted to get checked out.  No hx of similar in the past.  Past Medical History:  Diagnosis Date  . Gestational diabetes   . Hx of toxoplasmosis   . Medical history non-contributory     Patient Active Problem List   Diagnosis Date Noted  . History of gestational diabetes 06/13/2017  . Obesity (BMI 30-39.9) 03/09/2017  . Language barrier 03/09/2017    Past Surgical History:  Procedure Laterality Date  . NO PAST SURGERIES       OB History    Gravida  4   Para  4   Term  4   Preterm      AB      Living  4     SAB      IAB      Ectopic      Multiple  0   Live Births  4           Family History  Problem Relation Age of Onset  . Diabetes Paternal Uncle   . Hypertension Mother     Social History   Tobacco Use  . Smoking status: Never Smoker  . Smokeless tobacco: Never Used  Substance Use Topics  . Alcohol use: No  . Drug use: No    Home Medications Prior to Admission  medications   Medication Sig Start Date End Date Taking? Authorizing Provider  ibuprofen (ADVIL,MOTRIN) 600 MG tablet Take 1 tablet (600 mg total) by mouth every 6 (six) hours. 09/04/17   Arvilla Market, DO  Prenatal Vit-Fe Fumarate-FA (PREPLUS) 27-1 MG TABS Take 1 tablet by mouth daily. 01/21/17   Sharyon Cable, CNM    Allergies    Patient has no known allergies.  Review of Systems   Review of Systems  Cardiovascular: Positive for near-syncope.  Gastrointestinal: Positive for nausea.  All other systems reviewed and are negative.   Physical Exam Updated Vital Signs BP 127/77   Pulse 82   Temp 97.9 F (36.6 C)   Resp 18   SpO2 100%   Physical Exam Vitals and nursing note reviewed.  Constitutional:      Appearance: She is well-developed and well-nourished.  HENT:     Head: Normocephalic and atraumatic.     Mouth/Throat:     Mouth: Oropharynx is clear and moist.  Eyes:     Extraocular Movements: EOM normal.     Conjunctiva/sclera: Conjunctivae normal.  Pupils: Pupils are equal, round, and reactive to light.  Cardiovascular:     Rate and Rhythm: Normal rate and regular rhythm.     Heart sounds: Normal heart sounds.  Pulmonary:     Effort: Pulmonary effort is normal. No respiratory distress.     Breath sounds: Normal breath sounds. No rhonchi.  Abdominal:     General: Bowel sounds are normal.     Palpations: Abdomen is soft.     Tenderness: There is no abdominal tenderness. There is no rebound.  Musculoskeletal:        General: Normal range of motion.     Cervical back: Normal range of motion.  Skin:    General: Skin is warm and dry.  Neurological:     Mental Status: She is alert and oriented to person, place, and time.     Comments: AAOx3, answering questions and following commands appropriately; equal strength UE and LE bilaterally; CN grossly intact; moves all extremities appropriately without ataxia; no focal neuro deficits or facial asymmetry  appreciated  Psychiatric:        Mood and Affect: Mood and affect normal.     ED Results / Procedures / Treatments   Labs (all labs ordered are listed, but only abnormal results are displayed) Labs Reviewed  BASIC METABOLIC PANEL - Abnormal; Notable for the following components:      Result Value   Glucose, Bld 121 (*)    All other components within normal limits  URINALYSIS, ROUTINE W REFLEX MICROSCOPIC - Abnormal; Notable for the following components:   Color, Urine STRAW (*)    All other components within normal limits  CBC  CBG MONITORING, ED  I-STAT BETA HCG BLOOD, ED ([facility], [facility], [facility] ONLY)    EKG None  Radiology No results found.  Procedures Procedures (including critical care time)  Medications Ordered in ED Medications  sodium chloride 0.9 % bolus 1,000 mL (1,000 mLs Intravenous New Bag/Given 01/08/20 0513)  ondansetron (ZOFRAN) injection 4 mg (4 mg Intravenous Given 01/08/20 0515)    ED Course  I have reviewed the triage vital signs and the nursing notes.  Pertinent labs & imaging results that were available during my care of the patient were reviewed by me and considered in my medical decision making (see chart for details).    MDM Rules/Calculators/A&P  39 year old female here with episodes of feeling cold, nausea, and lightheadedness.  No falls or syncopal events.  No chest pain or SOB.  No fever or other infectious symptoms.  She is afebrile, non-toxic.  Neurologic exam is non-focal.  Labs obtained from triage are reassuring.  She does report poor oral intake recently, wonder if she has some orthostatic changes from this.  Will check orthostatic VS, given liter of IVF and dose of zofran.  Will reassess.  5:52 AM Patient states she is feeling a lot better after IVF here.  She is now tolerating PO without difficulty.  States she does not feel anxious anymore, thinks she just panicked when she got lightheaded/nauseated.  Likely from poor oral intake but no  profound orthostasis here.  Feel she is stable for discharge home with close OP follow-up.  Encouraged regular meals, good oral hydration.  Rx zofran given if needed.  Follow-up with PCP.  Return here for any new/acute changes.  Final Clinical Impression(s) / ED Diagnoses Final diagnoses:  Nausea  Lightheadedness    Rx / DC Orders ED Discharge Orders    None  Garlon Hatchet, PA-C 01/08/20 2993    Shon Baton, MD 01/12/20 [phone]

## 2020-01-08 NOTE — Discharge Instructions (Signed)
Make sure to eat regularly, even if small meals.  Stay hydrated with lots of fluids. Can take zofran if needed for nausea. Follow-up with your primary care doctor. Return here for new concerns.

## 2020-01-08 NOTE — ED Notes (Signed)
Patient verbalizes understanding of discharge instructions. Opportunity for questioning and answers were provided. Armband removed by staff, pt discharged from ED ambulatory.   

## 2020-04-15 ENCOUNTER — Emergency Department (HOSPITAL_BASED_OUTPATIENT_CLINIC_OR_DEPARTMENT_OTHER)
Admission: EM | Admit: 2020-04-15 | Discharge: 2020-04-15 | Disposition: A | Discharge status: Home / Self Care | Payer: Self-pay | Attending: Emergency Medicine | Admitting: Emergency Medicine

## 2020-04-15 ENCOUNTER — Ambulatory Visit
Admission: EM | Admit: 2020-04-15 | Discharge: 2020-04-15 | Disposition: A | Discharge status: Home / Self Care | Payer: Self-pay

## 2020-04-15 ENCOUNTER — Other Ambulatory Visit: Payer: Self-pay

## 2020-04-15 ENCOUNTER — Emergency Department (HOSPITAL_BASED_OUTPATIENT_CLINIC_OR_DEPARTMENT_OTHER): Payer: Self-pay | Admitting: Radiology

## 2020-04-15 DIAGNOSIS — R42 Dizziness and giddiness: Secondary | ICD-10-CM | POA: Insufficient documentation

## 2020-04-15 DIAGNOSIS — R002 Palpitations: Secondary | ICD-10-CM | POA: Insufficient documentation

## 2020-04-15 DIAGNOSIS — R079 Chest pain, unspecified: Secondary | ICD-10-CM | POA: Insufficient documentation

## 2020-04-15 DIAGNOSIS — R0602 Shortness of breath: Secondary | ICD-10-CM | POA: Insufficient documentation

## 2020-04-15 LAB — PREGNANCY, URINE: Preg Test, Ur: NEGATIVE

## 2020-04-15 LAB — BASIC METABOLIC PANEL
Anion gap: 11 (ref 5–15)
BUN: 14 mg/dL (ref 6–20)
CO2: 25 mmol/L (ref 22–32)
Calcium: 8.9 mg/dL (ref 8.9–10.3)
Chloride: 104 mmol/L (ref 98–111)
Creatinine, Ser: 0.8 mg/dL (ref 0.44–1.00)
GFR, Estimated: >60 mL/min (ref >60)
Glucose, Bld: 104 mg/dL — ABNORMAL HIGH (ref 70–99)
Potassium: 3.5 mmol/L (ref 3.5–5.1)
Sodium: 140 mmol/L (ref 135–145)

## 2020-04-15 LAB — CBC
HCT: 38.1 % (ref 36.0–46.0)
Hemoglobin: 12.7 g/dL (ref 12.0–15.0)
MCH: 30.7 pg (ref 26.0–34.0)
MCHC: 33.3 g/dL (ref 30.0–36.0)
MCV: 92 fL (ref 80.0–100.0)
Platelets: 266 10*3/uL (ref 150–400)
RBC: 4.14 MIL/uL (ref 3.87–5.11)
RDW: 13.5 % (ref 11.5–15.5)
WBC: 10.5 10*3/uL (ref 4.0–10.5)
nRBC: 0 % (ref 0.0–0.2)

## 2020-04-15 LAB — TROPONIN I (HIGH SENSITIVITY)
Troponin I (High Sensitivity): <2 ng/L (ref <18)
Troponin I (High Sensitivity): <2 ng/L (ref <18)

## 2020-04-15 NOTE — ED Provider Notes (Signed)
[facility] EMERGENCY DEPARTMENT Provider Note  CSN: [id number] Arrival date & time: 04/15/20 1509    History Chief Complaint  Patient presents with  . Chest Pain    HPI History via video interpreter Joanna Whitney is a 40 y.o. female with no significant past medical history reports onset of racing heart this morning around 11am, associated with SOB, sharp left sided chest pain and dizziness. Lasted about and then resolved. Symptoms returned awhile later and then resolved again. She feels fine between episodes. She does not have any history of HTN, DM or CAD. She had similar symptoms in December, seen in the ED and told everything was OK. No recent illnesses. No fever, cough, N/V/D, dysuria.   Past Medical History:  Diagnosis Date  . Gestational diabetes   . Hx of toxoplasmosis   . Medical history non-contributory     Past Surgical History:  Procedure Laterality Date  . NO PAST SURGERIES      Family History  Problem Relation Age of Onset  . Diabetes Paternal Uncle   . Hypertension Mother     Social History   Tobacco Use  . Smoking status: Never Smoker  . Smokeless tobacco: Never Used  Substance Use Topics  . Alcohol use: No  . Drug use: No     Home Medications Prior to Admission medications   Medication Sig Start Date End Date Taking? Authorizing Provider  ondansetron (ZOFRAN ODT) 4 MG disintegrating tablet Take 1 tablet (4 mg total) by mouth every 8 (eight) hours as needed for nausea. 01/08/20   Garlon Hatchet, PA-C     Allergies    Patient has no known allergies.   Review of Systems   Review of Systems A comprehensive review of systems was completed and negative except as noted in HPI.    Physical Exam BP 125/70   Pulse (!) 115   Temp 98.1 F (36.7 C) (Oral)   Resp (!) 21   LMP 03/23/2020   SpO2 99%   Physical Exam Vitals and nursing note reviewed.  Constitutional:      Appearance: Normal appearance.  HENT:      Head: Normocephalic and atraumatic.     Nose: Nose normal.     Mouth/Throat:     Mouth: Mucous membranes are moist.  Eyes:     Extraocular Movements: Extraocular movements intact.     Conjunctiva/sclera: Conjunctivae normal.  Cardiovascular:     Rate and Rhythm: Normal rate.  Pulmonary:     Effort: Pulmonary effort is normal.     Breath sounds: Normal breath sounds.  Chest:     Chest wall: Tenderness (L chest, reproduces symptoms) present.  Abdominal:     General: Abdomen is flat.     Palpations: Abdomen is soft.     Tenderness: There is no abdominal tenderness.  Musculoskeletal:        General: No swelling. Normal range of motion.     Cervical back: Neck supple.  Skin:    General: Skin is warm and dry.  Neurological:     General: No focal deficit present.     Mental Status: She is alert.  Psychiatric:        Mood and Affect: Mood normal.      ED Results / Procedures / Treatments   Labs (all labs ordered are listed, but only abnormal results are displayed) Labs Reviewed  BASIC METABOLIC PANEL - Abnormal; Notable for the following components:      Result Value  Glucose, Bld 104 (*)    All other components within normal limits  CBC  PREGNANCY, URINE  TROPONIN I (HIGH SENSITIVITY)  TROPONIN I (HIGH SENSITIVITY)    EKG None   Radiology No results found.  Procedures Procedures  Medications Ordered in the ED Medications - No data to display   MDM Rules/Calculators/A&P MDM Patient with tachypalpitations, currently asymptomatic. EKG is unremarkable. ED visit in December was for nausea and lightheadedness, no mention of racing heart, chest pain or SOB then. Will check labs, cardiac monitor if she has recurrence and re-eval.   ED Course  I have reviewed the triage vital signs and the nursing notes.  Pertinent labs & imaging results that were available during my care of the patient were reviewed by me and considered in my medical decision making (see chart for  details).  Clinical Course as of 04/15/20 1839  Wed Apr 15, 2020  1607 CXR is clear. CBC is normal.  [CS]  1640 BMP and Trop #1 are normal.  [CS]  1753 Patient reported some palpitations to RN while HR was ~110. No SVT or rapid afib seen on monitor.  [CS]  1829 Trop #2 is negative.  [CS]  3382 Patient asymptomatic now. Recommend outpatient follow up with cardiology to be considered for home monitor to further evaluate her palpitations. Recommend she RTED for any other concerns.  [CS]    Clinical Course User Index [CS] Pollyann Savoy, MD    Final Clinical Impression(s) / ED Diagnoses Final diagnoses:  Palpitations    Rx / DC Orders ED Discharge Orders    None       Pollyann Savoy, MD 04/15/20 [id number]

## 2020-04-15 NOTE — ED Triage Notes (Signed)
Patient reports to the ER from [facility]. Patient c/o left sided chest pain since 11am. Patient reports she feels like her heart is beating fast and she feels like she is about to faint. Patient reports centralized chest pain. Patient denies drinking or smoking. Denies chance of pregnancy. LMP Feb. 28.   Spanish Interpreter [phone] utilized.

## 2020-04-15 NOTE — ED Notes (Addendum)
Pt reports that in the last 2-3 minutes she feels like her palpitationas have increased. Pt denies pain. Pt not in distress MD notified. Pt ht rate jumping between 103-117

## 2020-04-15 NOTE — ED Triage Notes (Signed)
Pt c/o lt sided chest pain lasting less every few minutes today. Denies SOB or diaphoresis. No distress at this time.

## 2020-04-16 NOTE — ED Provider Notes (Signed)
[name]    CSN: [id number] Arrival date & time: 04/15/20  1414      History   Chief Complaint Chief Complaint  Patient presents with  . Chest Pain    HPI Joanna Whitney is a 40 y.o. female presenting today for evaluation of chest pressure shortness of breath and lightheadedness.  Reports symptoms began this morning around 11 AM.  Symptoms have been coming and going but have occurred multiple times since then.  She reports feeling pressure in her central and left chest.  Also reports feeling as if she is going to faint.  She denies any recent URI symptoms cough or cough.  Feels short of breath at times that her heart is racing.  Denies any abdominal pain nausea or vomiting.  She denies any history of hypertension, diabetes or tobacco use.  HPI  Past Medical History:  Diagnosis Date  . Gestational diabetes   . Hx of toxoplasmosis   . Medical history non-contributory     Patient Active Problem List   Diagnosis Date Noted  . History of gestational diabetes 06/13/2017  . Obesity (BMI 30-39.9) 03/09/2017  . Language barrier 03/09/2017    Past Surgical History:  Procedure Laterality Date  . NO PAST SURGERIES      OB History    Gravida  4   Para  4   Term  4   Preterm      AB      Living  4     SAB      IAB      Ectopic      Multiple  0   Live Births  4            Home Medications    Prior to Admission medications   Medication Sig Start Date End Date Taking? Authorizing Provider  ondansetron (ZOFRAN ODT) 4 MG disintegrating tablet Take 1 tablet (4 mg total) by mouth every 8 (eight) hours as needed for nausea. 01/08/20   Garlon Hatchet, PA-C    Family History Family History  Problem Relation Age of Onset  . Diabetes Paternal Uncle   . Hypertension Mother     Social History Social History   Tobacco Use  . Smoking status: Never Smoker  . Smokeless tobacco: Never Used  Substance Use Topics  . Alcohol use: No  .  Drug use: No     Allergies   Patient has no known allergies.   Review of Systems Review of Systems  Constitutional: Negative for fatigue and fever.  HENT: Negative for congestion, sinus pressure and sore throat.   Eyes: Negative for photophobia, pain and visual disturbance.  Respiratory: Positive for shortness of breath. Negative for cough.   Cardiovascular: Positive for chest pain and palpitations.  Gastrointestinal: Negative for abdominal pain, nausea and vomiting.  Genitourinary: Negative for decreased urine volume and hematuria.  Musculoskeletal: Negative for myalgias, neck pain and neck stiffness.  Neurological: Positive for light-headedness. Negative for dizziness, syncope, facial asymmetry, speech difficulty, weakness, numbness and headaches.     Physical Exam Triage Vital Signs ED Triage Vitals [04/15/20 1426]  Enc Vitals Group     BP 119/62     Pulse Rate (!) 101     Resp (!) 22     Temp 98.1 F (36.7 C)     Temp Source Oral     SpO2 98 %     Weight      Height      Head  Circumference      Peak Flow      Pain Score 0     Pain Loc      Pain Edu?      Excl. in GC?    No data found.  Updated Vital Signs BP 119/62 (BP Location: Left Arm)   Pulse (!) 101   Temp 98.1 F (36.7 C) (Oral)   Resp (!) 22   LMP 03/23/2020   SpO2 98%   Breastfeeding No   Visual Acuity Right Eye Distance:   Left Eye Distance:   Bilateral Distance:    Right Eye Near:   Left Eye Near:    Bilateral Near:     Physical Exam Vitals and nursing note reviewed.  Constitutional:      Appearance: She is well-developed.     Comments: No acute distress  HENT:     Head: Normocephalic and atraumatic.     Nose: Nose normal.  Eyes:     Conjunctiva/sclera: Conjunctivae normal.  Cardiovascular:     Rate and Rhythm: Regular rhythm. Tachycardia present.     Comments: Heart rate initially 130s, improved to around 112 at rest, but increased to 130s and symptoms returned Pulmonary:      Comments: Patient tachypneic on arrival, breathing comfortably at rest when calmed Abdominal:     General: There is no distension.  Musculoskeletal:        General: Normal range of motion.     Cervical back: Neck supple.  Skin:    General: Skin is warm and dry.  Neurological:     Mental Status: She is alert and oriented to person, place, and time.      UC Treatments / Results  Labs (all labs ordered are listed, but only abnormal results are displayed) Labs Reviewed - No data to display  EKG   Radiology DG Chest 2 View  Result Date: 04/15/2020 CLINICAL DATA:  Chest pain. EXAM: CHEST - 2 VIEW COMPARISON:  No recent prior. FINDINGS: Mediastinum hilar structures normal. Lungs are clear. No pleural effusion or pneumothorax. Heart size normal. No acute bony abnormality identified. IMPRESSION: No acute cardiopulmonary disease. Electronically Signed   By: Maisie Fus  Register   On: 04/15/2020 15:59    Procedures Procedures (including critical care time)  Medications Ordered in UC Medications - No data to display  Initial Impression / Assessment and Plan / UC Course  I have reviewed the triage vital signs and the nursing notes.  Pertinent labs & imaging results that were available during my care of the patient were reviewed by me and considered in my medical decision making (see chart for details).     EKG sinus tachycardia, no acute signs of ischemia or infarction.  No recent URI symptoms, lungs clear to auscultation, lower suspicion of pneumonia and especially given intermittent nature of symptoms.  But given intermittent chest pain with acute onset today recommending further evaluation work-up in emergency room.  Patient stable on discharge and transported with family member via private vehicle.  Discussed strict return precautions. Patient verbalized understanding and is agreeable with plan.  Final Clinical Impressions(s) / UC Diagnoses   Final diagnoses:  Chest pain,  unspecified type   Discharge Instructions   None    ED Prescriptions    None     PDMP not reviewed this encounter.   Lew Dawes, [place] 04/16/20 [phone]

## 2020-05-19 ENCOUNTER — Encounter: Payer: Self-pay | Admitting: Internal Medicine

## 2020-05-19 ENCOUNTER — Telehealth: Payer: Self-pay

## 2020-05-19 ENCOUNTER — Other Ambulatory Visit: Payer: Self-pay

## 2020-05-19 ENCOUNTER — Ambulatory Visit (INDEPENDENT_AMBULATORY_CARE_PROVIDER_SITE_OTHER): Payer: Self-pay | Admitting: Internal Medicine

## 2020-05-19 VITALS — BP 106/70 | HR 85 | Ht 64.0 in | Wt 171.4 lb

## 2020-05-19 DIAGNOSIS — R002 Palpitations: Secondary | ICD-10-CM

## 2020-05-19 DIAGNOSIS — R079 Chest pain, unspecified: Secondary | ICD-10-CM

## 2020-05-19 NOTE — Telephone Encounter (Signed)
30 day [employer] Event monitor registered to be mailed to pt's home address. Instructions were printed in AVS.

## 2020-05-19 NOTE — Progress Notes (Signed)
Cardiology Office Note:    Date:  05/19/2020   ID:  Mellody Drown, DOB Sep 22, 1980, MRN [id number]  PCP:  Pcp, No  Cardiologist:  No primary care provider on file.  Electrophysiologist:  None   Referring MD: Pollyann Savoy, MD   Chief Complaint/Reason for Referral: Palpitations, chest pain.  History of Present Illness:    Joanna Whitney is a 40 y.o. female with a history of gestational diabetes. Visit completed with assistance of in person spanish interpreter Byrd Hesselbach.  Presented to the ER 04/15/20 with racing heart, shortness of breath, sharp left sided chest pain and dizziness, lasted about 20 min and then resolved. Had recurring symptoms later that day. Previous evaluation in the ED in 12/21 for similar symptoms with unremarkable evaluation.  Today, she is accompanied by her son. Since her visit to the ED she has had episodes of chest pain and palpitations. She notes feeling dizzy before the onset of palpitations. When she has palpitations she feels frustrated and says "her head is about to burst."  Correlating with her chest pain and palpitations, she also experiences weakness in her legs, shortness of breath, and pre-syncope.  Occasionally there will also be pain behind her left eye but this has not occurred recently.  She has never had any chest pains in isolation, however she has palpitations with every episode of chest pain. The entire week she went to the ED she had the symptoms, but since then she has only had a few episodes, likely from cutting out caffeine. There has been no notable change in her daily activity since her hospital visit. No LE edema. The patient denies PND and orthopnea. Denies cough, fever, chills. Denies nausea, vomiting. She has never had a syncopal episode.  In the last week, she has not had any palpitations. In the last month, she had about 3 episodes of palpitations. Prior to one month ago she had episodes every day.  Her chest pain and  palpitations first manifested after some formal exercise with [employer]. She has not participated in formal exercise lately but wishes to continue with [employer] soon. She also used to walk and she felt significant chest pressure while walking, as well as chest pain and palpitations.  She is not a smoker, does not drink alcohol, and does not use any herbal supplements. She takes a multivitamin, and used to drink a cup of coffee on a daily basis but is not drinking coffee any more. She does not drink soda or tea.  She is gravida 4, and was diabetic during her fourth pregnancy.   No family history of heart disease. She denies any sudden deaths or drownings in her FHx.   Past Medical History:  Diagnosis Date  . Gestational diabetes   . Hx of toxoplasmosis   . Medical history non-contributory     Past Surgical History:  Procedure Laterality Date  . NO PAST SURGERIES      Current Medications: Current Meds  Medication Sig  . loratadine (CLARITIN) 10 MG tablet Take 10 mg by mouth daily.  . [DISCONTINUED] ondansetron (ZOFRAN ODT) 4 MG disintegrating tablet Take 1 tablet (4 mg total) by mouth every 8 (eight) hours as needed for nausea.     Allergies:   Patient has no known allergies.   Social History   Tobacco Use  . Smoking status: Never Smoker  . Smokeless tobacco: Never Used  Substance Use Topics  . Alcohol use: No  . Drug use: No     Family History:  The patient's family history includes Diabetes in her paternal uncle; Hypertension in her mother.  ROS:   Please see the history of present illness.    (+) Chest Pain (+) Palpitations (+) Dizziness (+) LE weakness (+) Shortness of Breath (+) Pre-syncope (+) Chest Pressure All other systems reviewed and are negative.  EKGs/Labs/Other Studies Reviewed:    The following studies were reviewed today:  EKG:   05/19/2020: Sinus rhythm. Rate 85 bpm.  Recent Labs: 04/15/2020: BUN 14; Creatinine, Ser 0.80; Hemoglobin 12.7; Platelets  266; Potassium 3.5; Sodium 140  Recent Lipid Panel No results found for: CHOL, TRIG, HDL, CHOLHDL, VLDL, LDLCALC, LDLDIRECT  Physical Exam:    VS:  BP 106/70   Pulse 85   Ht 5\' 4"  (1.626 m)   Wt 171 lb 6.4 oz (77.7 kg)   SpO2 97%   BMI 29.42 kg/m     Wt Readings from Last 5 Encounters:  05/19/20 171 lb 6.4 oz (77.7 kg)  10/17/17 174 lb 9.6 oz (79.2 kg)  09/02/17 195 lb (88.5 kg)  08/29/17 193 lb 12.8 oz (87.9 kg)  08/22/17 193 lb 6.4 oz (87.7 kg)    Constitutional: No acute distress Eyes: sclera non-icteric, normal conjunctiva and lids ENMT: normal dentition, moist mucous membranes Cardiovascular: regular rhythm, normal rate, no murmurs. S1 and S2 normal. Radial pulses normal bilaterally. No jugular venous distention.  Respiratory: clear to auscultation bilaterally GI : normal bowel sounds, soft and nontender. No distention.   MSK: extremities warm, well perfused. No edema.  NEURO: grossly nonfocal exam, moves all extremities. PSYCH: alert and oriented x 3, normal mood and affect.   ASSESSMENT:    1. Palpitations   2. Chest pain of uncertain etiology    PLAN:    Palpitations - Plan: EKG 12-Lead, ECHOCARDIOGRAM COMPLETE, Cardiac event monitor  Chest pain of uncertain etiology - Plan: EKG 12-Lead, ECHOCARDIOGRAM COMPLETE, Cardiac event monitor   Her palpitations are a significantly bothersome symptom but have improved after decreasing her caffeine intake.  Based on her description I suspect a supraventricular tachycardia, however it is difficult to know.  To screen her for more malignant arrhythmias we will perform a 30-day cardiac monitor and echocardiogram.  Patient is very much in favor of this.  We have also discussed performing stress testing with treadmill exercise stress test.  She would like to wait until after the monitor and the echocardiogram have been completed, if no source is revealed, we will proceed with exercise stress testing.  No change to medication  therapies today.  She may return to a moderate walking program but would not pursue strenuous exercise until further assessment of cardiac status is performed.   Total time of encounter: 60 minutes total time of encounter, including 37 minutes spent in face-to-face patient care on the date of this encounter. This time includes coordination of care and counseling regarding above mentioned problem list. Remainder of non-face-to-face time involved reviewing chart documents/testing relevant to the patient encounter and documentation in the medical record. I have independently reviewed documentation from referring provider.    Medication Adjustments/Labs and Tests Ordered: Current medicines are reviewed at length with the patient today.  Concerns regarding medicines are outlined above.   Orders Placed This Encounter  Procedures  . Cardiac event monitor  . EKG 12-Lead  . ECHOCARDIOGRAM COMPLETE    No orders of the defined types were placed in this encounter.   Patient Instructions  Medication Instructions:  Your physician recommends that you continue on your  current medications as directed. Please refer to the Current Medication list given to you today.  *If you need a refill on your cardiac medications before your next appointment, please call your pharmacy*   Lab Work: None ordered   Testing/Procedures:  Your physician has requested that you have an echocardiogram. Echocardiography is a painless test that uses sound waves to create images of your heart. It provides your doctor with information about the size and shape of your heart and how well your heart's chambers and valves are working. This procedure takes approximately one hour. There are no restrictions for this procedure.       Follow-Up: At [facility], you and your health needs are our priority.  As part of our continuing mission to provide you with exceptional heart care, we have created designated Provider Care  Teams.  These Care Teams include your primary Cardiologist (physician) and Advanced Practice Providers (APPs -  Physician Assistants and Nurse Practitioners) who all work together to provide you with the care you need, when you need it.  We recommend signing up for the patient portal called "MyChart".  Sign up information is provided on this After Visit Summary.  MyChart is used to connect with patients for Virtual Visits (Telemedicine).  Patients are able to view lab/test results, encounter notes, upcoming appointments, etc.  Non-urgent messages can be sent to your provider as well.   To learn more about what you can do with MyChart, go to [email or web address].    Your next appointment:   6 week(s)  The format for your next appointment:   In Person  Provider:   You may see Dr. Jacques Navy or one of the following Advanced Practice Providers on your designated Care Team:    Theodore Demark, PA-C  Joni Reining, DNP, ANP    Other Instructions Please call office if your chest pain continues so a stress test can be scheduled.       I,Mathew Stumpf,acting as a Neurosurgeon for Parke Poisson, MD.,have documented all relevant documentation on the behalf of Parke Poisson, MD,as directed by  Parke Poisson, MD while in the presence of Parke Poisson, MD.  I, Parke Poisson, MD, have reviewed all documentation for this visit. The documentation on 05/19/20 for the exam, diagnosis, procedures, and orders are all accurate and complete.   Weston Brass, MD, [facility] [place]  [facility] Medical Director of Advanced Cardiac Imaging and Non-Invasive Cardiology Direct Dial: [phone]  Fax: [phone]  Website:  [email or web address]

## 2020-05-19 NOTE — Patient Instructions (Addendum)
Medication Instructions:  Your physician recommends that you continue on your current medications as directed. Please refer to the Current Medication list given to you today.  *If you need a refill on your cardiac medications before your next appointment, please call your pharmacy*  Testing/Procedures:  Your physician has requested that you have an echocardiogram. Echocardiography is a painless test that uses sound waves to create images of your heart. It provides your doctor with information about the size and shape of your heart and how well your heart's chambers and valves are working. This procedure takes approximately one hour. There are no restrictions for this procedure.   Follow-Up: At [facility], you and your health needs are our priority.  As part of our continuing mission to provide you with exceptional heart care, we have created designated Provider Care Teams.  These Care Teams include your primary Cardiologist (physician) and Advanced Practice Providers (APPs -  Physician Assistants and Nurse Practitioners) who all work together to provide you with the care you need, when you need it.  We recommend signing up for the patient portal called "MyChart".  Sign up information is provided on this After Visit Summary.  MyChart is used to connect with patients for Virtual Visits (Telemedicine).  Patients are able to view lab/test results, encounter notes, upcoming appointments, etc.  Non-urgent messages can be sent to your provider as well.   To learn more about what you can do with MyChart, go to [email or web address].    Your next appointment:   6 week(s)  The format for your next appointment:   In Person  Provider:   You may see Dr. Jacques Navy or one of the following Advanced Practice Providers on your designated Care Team:    Theodore Demark, PA-C  Joni Reining, DNP, ANP  Other Instructions Please call office if your chest pain continues so a stress test can be  discussed  [employer] Cardiac Event Monitor Instructions Your physician has requested you wear your cardiac event monitor for  30 days, (1-30). [employer] may call or text to confirm a shipping address. The monitor will be sent to a land address via UPS. [employer] will not ship a monitor to a PO BOX. It typically takes 3-5 days to receive your monitor after it has been enrolled. [employer] will assist with USPS tracking if your package is delayed. The telephone number for [employer] is [phone]. Once you have received your monitor, please review the enclosed instructions. Instruction tutorials can also be viewed under help and settings on the enclosed cell phone. Your monitor has already been registered assigning a specific monitor serial # to you.  Applying the monitor Remove cell phone from case and turn it on. The cell phone works as IT consultant and needs to be within [employer] of you at all times. The cell phone will need to be charged on a daily basis. We recommend you plug the cell phone into the enclosed charger at your bedside table every night.  Monitor batteries: You will receive two monitor batteries labelled #1 and #2. These are your recorders. Plug battery #2 onto the second connection on the enclosed charger. Keep one battery on the charger at all times. This will keep the monitor battery deactivated. It will also keep it fully charged for when you need to switch your monitor batteries. A small light will be blinking on the battery emblem when it is charging. The light on the battery emblem will remain on when the battery is fully charged.  Open package of a Monitor strip. Insert battery #1 into black hood on strip and gently squeeze monitor battery onto connection as indicated in instruction booklet. Set aside while preparing skin.  Choose location for your strip, vertical or horizontal, as indicated in the instruction booklet. Shave to remove all hair from  location. There cannot be any lotions, oils, powders, or colognes on skin where monitor is to be applied. Wipe skin clean with enclosed Saline wipe. Dry skin completely.  Peel paper labeled #1 off the back of the Monitor strip exposing the adhesive. Place the monitor on the chest in the vertical or horizontal position shown in the instruction booklet. One arrow on the monitor strip must be pointing upward. Carefully remove paper labeled #2, attaching remainder of strip to your skin. Try not to create any folds or wrinkles in the strip as you apply it.  Firmly press and release the circle in the center of the monitor battery. You will hear a small beep. This is turning the monitor battery on. The heart emblem on the monitor battery will light up every 5 seconds if the monitor battery in turned on and connected to the patient securely. Do not push and hold the circle down as this turns the monitor battery off. The cell phone will locate the monitor battery. A screen will appear on the cell phone checking the connection of your monitor strip. This may read poor connection initially but change to good connection within the next minute. Once your monitor accepts the connection you will hear a series of 3 beeps followed by a climbing crescendo of beeps. A screen will appear on the cell phone showing the two monitor strip placement options. Touch the picture that demonstrates where you applied the monitor strip.  Your monitor strip and battery are waterproof. You are able to shower, bathe, or swim with the monitor on. They just ask you do not submerge deeper than 3 feet underwater. We recommend removing the monitor if you are swimming in a lake, river, or ocean.  Your monitor battery will need to be switched to a fully charged monitor battery approximately once a week. The cell phone will alert you of an action which needs to be made.  On the cell phone, tap for details to reveal connection status,  monitor battery status, and cell phone battery status. The green dots indicates your monitor is in good status. A red dot indicates there is something that needs your attention.  To record a symptom, click the circle on the monitor battery. In 30-60 seconds a list of symptoms will appear on the cell phone. Select your symptom and tap save. Your monitor will record a sustained or significant arrhythmia regardless of you clicking the button. Some patients do not feel the heart rhythm irregularities. [employer] will notify us of any serious or critical events.  Refer to instruction booklet for instructions on switching batteries, changing strips, the Do not disturb or Pause features, or any additional questions.  Call [employer] at [phone], to confirm your monitor is transmitting and record your baseline. They will answer any questions you may have regarding the monitor instructions at that time.  Returning the monitor to [employer] Place all equipment back into blue box. Peel off strip of paper to expose adhesive and close box securely. There is a prepaid UPS shipping label on this box. Drop in a UPS drop box, or at a UPS facility like [employer]. You may also contact [employer] to arrange UPS to  pick up monitor package at your home.

## 2020-05-20 LAB — TSH: TSH: 1.26 u[IU]/mL (ref 0.450–4.500)

## 2020-05-25 ENCOUNTER — Telehealth: Payer: Self-pay | Admitting: Internal Medicine

## 2020-05-25 NOTE — Telephone Encounter (Signed)
Patient's son returning call for lab results. ?

## 2020-05-25 NOTE — Telephone Encounter (Signed)
Spoke with the patient and her son and informed them of lab results. They verbalized understanding.

## 2020-06-16 ENCOUNTER — Other Ambulatory Visit: Payer: Self-pay

## 2020-06-16 ENCOUNTER — Ambulatory Visit (HOSPITAL_COMMUNITY): Payer: Self-pay | Attending: Internal Medicine

## 2020-06-16 DIAGNOSIS — R002 Palpitations: Secondary | ICD-10-CM | POA: Insufficient documentation

## 2020-06-16 DIAGNOSIS — R079 Chest pain, unspecified: Secondary | ICD-10-CM | POA: Insufficient documentation

## 2020-06-16 LAB — ECHOCARDIOGRAM COMPLETE
Area-P 1/2: 3.77 cm2
S' Lateral: 2.8 cm

## 2020-06-17 NOTE — Progress Notes (Signed)
Cardiology Office Note   Date:  06/19/2020   ID:  Joanna Whitney, DOB [birth year], MRN [id number]  PCP:  Pcp, No  Cardiologist:  Dr.Acharya  No chief complaint on file.    History of Present Illness: Joanna Whitney is a 40 y.o. female who presents for ongoing assessment and management of chest discomfort, rapid heart rate, shortness of breath.  She was initially seen in the ER on 04/15/2020, and ruled out for ACS.  She was seen in the office on 05/19/2020 with continued complaints of chest discomfort, palpitations, and feeling pressure in her head "as it is about to burst".  She reported that the discomfort and palpitations first occurred after completing exercise with Zumba class.  EKG revealed normal sinus rhythm with a rate of 85 bpm.  An echocardiogram and a cardiac event monitor were ordered.  She was advised on decreasing caffeine intake.  It was suspected that she had supraventricular tachycardia but this was to be confirmed by cardiac monitor.  A cardiac stress test was discussed but she wished to wait until other testing was complete.  At the time of this office visit cardiac monitor has not been resulted.  Echocardiogram completed on 06/16/2020 revealed global longitudinal strain -23.2%, LVEF estimated 70 to 75%.  LV had no regional wall motion abnormalities and there were normal LV diastolic pressures.  There was no valvular abnormalities.  The study was read as normal.  She comes today and with help of an interpreter reports that she has had 1 episode of rapid heart rate since being seen last.  She felt it was a "panic attack" was in a room and felt as if she could not breathe and her heart rate increased for several minutes.  She had to leave the room and take some deep breaths outside in order to calm down and get her heart rate back under control.  She is now wearing a smart watch that was given to her by her boyfriend.  She has not looked at it during the time that she had  rapid heart rate but plans on being more aware of checking it from this point on.  She is never received cardiac monitor that was ordered for her on last office visit.  She called her office 2 weeks ago to report this and was told that it was on his way.  She still has not received it.  Past Medical History:  Diagnosis Date  . Gestational diabetes   . Hx of toxoplasmosis   . Medical history non-contributory     Past Surgical History:  Procedure Laterality Date  . NO PAST SURGERIES       No current outpatient medications on file.   No current facility-administered medications for this visit.    Allergies:   Patient has no known allergies.    Social History:  The patient  reports that she has never smoked. She has never used smokeless tobacco. She reports that she does not drink alcohol and does not use drugs.   Family History:  The patient's family history includes Diabetes in her paternal uncle; Hypertension in her mother.    ROS: All other systems are reviewed and negative. Unless otherwise mentioned in H&P    PHYSICAL EXAM: VS:  BP 102/70   Pulse 89   Ht 5\' 4"  (1.626 m)   Wt 173 lb 9.6 oz (78.7 kg)   SpO2 98%   BMI 29.80 kg/m  , BMI Body mass index is 29.8 kg/m. GEN:  Well nourished, well developed, in no acute distress HEENT: normal Neck: no JVD, carotid bruits, or masses Cardiac: RRR; no murmurs, rubs, or gallops,no edema  Respiratory:  Clear to auscultation bilaterally, normal work of breathing GI: soft, nontender, nondistended, + BS MS: no deformity or atrophy Skin: warm and dry, no rash Neuro:  Strength and sensation are intact Psych: euthymic mood, full affect   EKG: Not completed this office visit.  I have reviewed previous EKG completed on 05/19/2020, there was no evidence of WPW, or prolonged QT interval.  Recent Labs: 04/15/2020: BUN 14; Creatinine, Ser 0.80; Hemoglobin 12.7; Platelets 266; Potassium 3.5; Sodium 140 05/19/2020: TSH 1.260    Lipid  Panel No results found for: CHOL, TRIG, HDL, CHOLHDL, VLDL, LDLCALC, LDLDIRECT    Wt Readings from Last 3 Encounters:  06/19/20 173 lb 9.6 oz (78.7 kg)  05/19/20 171 lb 6.4 oz (77.7 kg)  10/17/17 174 lb 9.6 oz (79.2 kg)      Other studies Reviewed: Echocardiogram 07-14-2020 1. Global longitudinal strain is -23.2%.. Left ventricular ejection  fraction, by estimation, is 70 to 75%. The left ventricle has hyperdynamic  function. The left ventricle has no regional wall motion abnormalities.  Left ventricular diastolic parameters  were normal.  2. Right ventricular systolic function is normal. The right ventricular  size is normal.  3. The mitral valve is normal in structure. Trivial mitral valve  regurgitation.  4. The aortic valve is normal in structure. Aortic valve regurgitation is  not visualized.  5. The inferior vena cava is normal in size with greater than 50%  respiratory variability, suggesting right atrial pressure of 3 mmHg.    ASSESSMENT AND PLAN:  1.  Rapid heart rate: Uncertain etiology at this time.  Echocardiogram was normal.  She did not receive cardiac monitor in the mail and we are following up on this to make sure that it was sent to the correct address and we will contact her when we have more information.  In the interim I have asked her to check her smart watch to find out which rhythm she is in when her heart rate is going fast if this reoccurs.  This can be downloaded onto her phone which I explained to her and she can bring it back on her next office visit.  If she does have the monitor we we will see if this correlates.    I have asked her to begin magnesium 100 mg tablets daily.  I reviewed her labs and her TSH was normal.  Her potassium was 3.5.  Once we rule out any cardiac/ electrophysiologic etiology for her rapid heart rhythm, we may need to refer her for counseling if she is indeed having panic attacks.  For now we will review her cardiac monitor and  or smart phone results should this recur.  I have given her reassurance concerning her lab work and her echocardiogram and she is very grateful and relieved knowing that so far everything has come back normal.  Current medicines are reviewed at length with the patient today.  I have spent 25 minutes with assistance of interpreter dedicated to the care of this patient on the date of this encounter to include pre-visit review of records, assessment, management and diagnostic testing,with shared decision making.  Labs/ tests ordered today include: None.  Follow-up on cardiac monitor to be sure that it is being sent.  Bettey Mare. Liborio Nixon, ANP, AACC   06/19/2020 4:39 PM    [facility] [phone] Fax [phone]  Notice: This dictation was prepared with Dragon dictation along with smaller phrase technology. Any transcriptional errors that result from this process are unintentional and may not be corrected upon review.

## 2020-06-19 ENCOUNTER — Telehealth: Payer: Self-pay | Admitting: Internal Medicine

## 2020-06-19 ENCOUNTER — Encounter: Payer: Self-pay | Admitting: Adult Health

## 2020-06-19 ENCOUNTER — Other Ambulatory Visit: Payer: Self-pay

## 2020-06-19 ENCOUNTER — Ambulatory Visit (INDEPENDENT_AMBULATORY_CARE_PROVIDER_SITE_OTHER): Payer: Self-pay | Admitting: Adult Health

## 2020-06-19 VITALS — BP 102/70 | HR 89 | Ht 64.0 in | Wt 173.6 lb

## 2020-06-19 DIAGNOSIS — R002 Palpitations: Secondary | ICD-10-CM

## 2020-06-19 NOTE — Telephone Encounter (Signed)
Pt and Son Jilda Panda are returning call for pt's results. Please advise

## 2020-06-19 NOTE — Telephone Encounter (Signed)
Baird Cancer, RN  06/19/2020 11:18 AM EDT Back to Top     Returned call to patient and patient's son, made them both aware of Echo results and they both verbalized understanding. Advised to call back with any issues, questions, or concerns.

## 2020-06-19 NOTE — Telephone Encounter (Signed)
Follow Up:     Pt is returning Jenna's call from today 

## 2020-06-19 NOTE — Patient Instructions (Signed)
Medication Instructions:  Start Magnesium 100 mg ( 1 Daily) *If you need a refill on your cardiac medications before your next appointment, please call your pharmacy*   Lab Work: No Labs If you have labs (blood work) drawn today and your tests are completely normal, you will receive your results only by: Marland Kitchen MyChart Message (if you have MyChart) OR . A paper copy in the mail If you have any lab test that is abnormal or we need to change your treatment, we will call you to review the results.   Testing/Procedures: No Testing   Follow-Up: At [facility], you and your health needs are our priority.  As part of our continuing mission to provide you with exceptional heart care, we have created designated Provider Care Teams.  These Care Teams include your primary Cardiologist (physician) and Advanced Practice Providers (APPs -  Physician Assistants and Nurse Practitioners) who all work together to provide you with the care you need, when you need it.  We recommend signing up for the patient portal called "MyChart".  Sign up information is provided on this After Visit Summary.  MyChart is used to connect with patients for Virtual Visits (Telemedicine).  Patients are able to view lab/test results, encounter notes, upcoming appointments, etc.  Non-urgent messages can be sent to your provider as well.   To learn more about what you can do with MyChart, go to [email or web address].    Your next appointment:   6 week(s)  The format for your next appointment:   In Person  Provider:   Weston Brass, MD   Other Instructions  For Sustained Heart Rate over 120 BPM. Please Call Our Office.

## 2020-06-19 NOTE — Telephone Encounter (Signed)
Attempted to call patient, left message for patient to call back to office.   

## 2020-06-24 ENCOUNTER — Telehealth: Payer: Self-pay | Admitting: *Deleted

## 2020-06-24 ENCOUNTER — Encounter: Payer: Self-pay | Admitting: *Deleted

## 2020-06-24 NOTE — Telephone Encounter (Signed)
[employer] Representative, Marthann Schiller), informed patient stated she did not receive her monitor.  According to website address verified but monitor never shipped.   Was told patient was probably called to confirm self pay options but [employer] was never able to speak with patient because they were not able to leave a voice mail or patient never returned their call.  Spanish speaking was specified on enrollment.  Marthann Schiller will check on monitor and have shipped out today.

## 2020-07-01 ENCOUNTER — Ambulatory Visit (INDEPENDENT_AMBULATORY_CARE_PROVIDER_SITE_OTHER): Payer: Self-pay

## 2020-07-01 DIAGNOSIS — R079 Chest pain, unspecified: Secondary | ICD-10-CM

## 2020-07-01 DIAGNOSIS — R002 Palpitations: Secondary | ICD-10-CM

## 2020-08-04 ENCOUNTER — Encounter: Payer: Self-pay | Admitting: Physician Assistant

## 2020-08-06 NOTE — Progress Notes (Signed)
This encounter was created in error - please disregard.

## 2020-11-23 ENCOUNTER — Other Ambulatory Visit: Payer: Self-pay | Admitting: Nurse Practitioner

## 2020-11-23 DIAGNOSIS — R102 Pelvic and perineal pain unspecified side: Secondary | ICD-10-CM

## 2020-11-23 DIAGNOSIS — R140 Abdominal distension (gaseous): Secondary | ICD-10-CM

## 2020-12-07 ENCOUNTER — Ambulatory Visit
Admission: RE | Admit: 2020-12-07 | Discharge: 2020-12-07 | Disposition: A | Discharge status: Home / Self Care | Payer: No Typology Code available for payment source | Source: Ambulatory Visit | Attending: Nurse Practitioner | Admitting: Nurse Practitioner

## 2020-12-07 DIAGNOSIS — R140 Abdominal distension (gaseous): Secondary | ICD-10-CM

## 2020-12-08 ENCOUNTER — Ambulatory Visit
Admission: RE | Admit: 2020-12-08 | Discharge: 2020-12-08 | Disposition: A | Discharge status: Home / Self Care | Payer: No Typology Code available for payment source | Source: Ambulatory Visit | Attending: Nurse Practitioner | Admitting: Nurse Practitioner

## 2020-12-08 DIAGNOSIS — R102 Pelvic and perineal pain: Secondary | ICD-10-CM

## 2020-12-08 DIAGNOSIS — R140 Abdominal distension (gaseous): Secondary | ICD-10-CM

## 2021-03-13 ENCOUNTER — Encounter (HOSPITAL_BASED_OUTPATIENT_CLINIC_OR_DEPARTMENT_OTHER): Payer: Self-pay | Admitting: Emergency Medicine

## 2021-03-13 ENCOUNTER — Emergency Department (HOSPITAL_BASED_OUTPATIENT_CLINIC_OR_DEPARTMENT_OTHER)
Admission: EM | Admit: 2021-03-13 | Discharge: 2021-03-13 | Disposition: A | Discharge status: Home / Self Care | Payer: Self-pay | Attending: Emergency Medicine | Admitting: Emergency Medicine

## 2021-03-13 ENCOUNTER — Other Ambulatory Visit: Payer: Self-pay

## 2021-03-13 ENCOUNTER — Emergency Department (HOSPITAL_BASED_OUTPATIENT_CLINIC_OR_DEPARTMENT_OTHER): Payer: Self-pay | Admitting: Radiology

## 2021-03-13 DIAGNOSIS — R079 Chest pain, unspecified: Secondary | ICD-10-CM | POA: Insufficient documentation

## 2021-03-13 DIAGNOSIS — R231 Pallor: Secondary | ICD-10-CM | POA: Insufficient documentation

## 2021-03-13 DIAGNOSIS — R000 Tachycardia, unspecified: Secondary | ICD-10-CM | POA: Insufficient documentation

## 2021-03-13 DIAGNOSIS — F419 Anxiety disorder, unspecified: Secondary | ICD-10-CM | POA: Insufficient documentation

## 2021-03-13 DIAGNOSIS — R0602 Shortness of breath: Secondary | ICD-10-CM | POA: Insufficient documentation

## 2021-03-13 LAB — CBC
HCT: 37.9 % (ref 36.0–46.0)
Hemoglobin: 12.8 g/dL (ref 12.0–15.0)
MCH: 30 pg (ref 26.0–34.0)
MCHC: 33.8 g/dL (ref 30.0–36.0)
MCV: 88.8 fL (ref 80.0–100.0)
Platelets: 292 10*3/uL (ref 150–400)
RBC: 4.27 MIL/uL (ref 3.87–5.11)
RDW: 13.2 % (ref 11.5–15.5)
WBC: 8.7 10*3/uL (ref 4.0–10.5)
nRBC: 0 % (ref 0.0–0.2)

## 2021-03-13 LAB — BASIC METABOLIC PANEL
Anion gap: 16 — ABNORMAL HIGH (ref 5–15)
BUN: 11 mg/dL (ref 6–20)
CO2: 18 mmol/L — ABNORMAL LOW (ref 22–32)
Calcium: 9.1 mg/dL (ref 8.9–10.3)
Chloride: 101 mmol/L (ref 98–111)
Creatinine, Ser: 0.64 mg/dL (ref 0.44–1.00)
GFR, Estimated: >60 mL/min (ref >60)
Glucose, Bld: 143 mg/dL — ABNORMAL HIGH (ref 70–99)
Potassium: 3.1 mmol/L — ABNORMAL LOW (ref 3.5–5.1)
Sodium: 135 mmol/L (ref 135–145)

## 2021-03-13 LAB — HCG, SERUM, QUALITATIVE: Preg, Serum: NEGATIVE

## 2021-03-13 LAB — D-DIMER, QUANTITATIVE: D-Dimer, Quant: 0.37 ug/mL-FEU (ref 0.00–0.50)

## 2021-03-13 LAB — TROPONIN I (HIGH SENSITIVITY)
Troponin I (High Sensitivity): <2 ng/L (ref <18)
Troponin I (High Sensitivity): 5 ng/L (ref <18)

## 2021-03-13 LAB — PREGNANCY, URINE: Preg Test, Ur: NEGATIVE

## 2021-03-13 MED ORDER — KETOROLAC TROMETHAMINE 30 MG/ML IJ SOLN: 30.0000 mg | Freq: Once | INTRAMUSCULAR | Status: DC

## 2021-03-13 MED ORDER — POTASSIUM CHLORIDE CRYS ER 20 MEQ PO TBCR
40.0000 meq | EXTENDED_RELEASE_TABLET | Freq: Once | ORAL | Status: AC
  Administered 2021-03-13: 40 meq via ORAL
  Filled 2021-03-13: qty 2

## 2021-03-13 MED ORDER — SODIUM CHLORIDE 0.9 % IV BOLUS
1000.0000 mL | Freq: Once | INTRAVENOUS | Status: AC
  Administered 2021-03-13: 1000 mL via INTRAVENOUS

## 2021-03-13 MED ORDER — KETOROLAC TROMETHAMINE 30 MG/ML IJ SOLN
30.0000 mg | Freq: Once | INTRAMUSCULAR | Status: AC
  Administered 2021-03-13: 30 mg via INTRAVENOUS
  Filled 2021-03-13: qty 1

## 2021-03-13 NOTE — ED Triage Notes (Signed)
Pt from home, co/ chest intermittent pain that started at 0700 in the left chest that radiating to the back. Pt diaphoretic. Pt denis N/V.

## 2021-03-13 NOTE — Discharge Instructions (Addendum)
You have been seen and discharged from the emergency department.  Your cardiac and lung work up were normal. Your pain could be from musculoskeletal. Take Tylenol and Ibuprofen as needed. Follow-up with your primary provider/cardiologist for further evaluation and further care. Take home medications as prescribed. If you have any worsening symptoms or further concerns for your health please return to an emergency department for further evaluation.

## 2021-03-13 NOTE — ED Notes (Signed)
Dr.Kristen Horton notified of standing ED triage orders

## 2021-03-13 NOTE — ED Provider Notes (Signed)
[facility] EMERGENCY DEPT Provider Note   CSN: [id number] Arrival date & time: 03/13/21  7517     History  No chief complaint on file.   Joanna Whitney is a 41 y.o. female.  HPI  41 year old primarily Spanish-speaking female presents to the emergency department with concern for left-sided chest pain.  Interpreter services used.  Patient denies any past medical history.  States she woke up this morning around 7 AM developed left-sided chest pain that radiated through to her back.  She had associated clamminess and shortness of breath.  States the symptoms lasted for about an hour then improved.  Currently she feels fatigued but no active chest pain.  She is very anxious and concerned and wants to know what is causing her symptoms.  She had similar symptoms about a year ago related to palpitations/racing heartbeat.  She was evaluated in urgent care/ER and medically cleared for outpatient follow-up.  Has not had any cardiology follow-up.  Currently denies any shortness of breath, hemoptysis, leg swelling, DVT/PE risk factors.  Home Medications Prior to Admission medications   Not on File      Allergies    Patient has no known allergies.    Review of Systems   Review of Systems  Constitutional:  Positive for fatigue. Negative for fever.  Respiratory:  Positive for shortness of breath. Negative for cough and wheezing.   Cardiovascular:  Positive for chest pain. Negative for palpitations and leg swelling.  Gastrointestinal:  Negative for abdominal pain, diarrhea and vomiting.  Genitourinary:  Negative for flank pain.  Skin:  Negative for rash.  Neurological:  Negative for headaches.   Physical Exam Updated Vital Signs BP 105/61    Pulse 98    Temp 98.7 F (37.1 C) (Oral)    Resp 18    Ht 5\' 4"  (1.626 m)    Wt 79.7 kg    LMP 03/07/2021 (Exact Date)    SpO2 98%    BMI 30.16 kg/m  Physical Exam Vitals and nursing note reviewed.  Constitutional:      General:  She is not in acute distress.    Appearance: Normal appearance.     Comments: Anxious and at times tearful  HENT:     Head: Normocephalic.     Mouth/Throat:     Mouth: Mucous membranes are moist.  Cardiovascular:     Rate and Rhythm: Tachycardia present.  Pulmonary:     Effort: Pulmonary effort is normal. No respiratory distress.     Breath sounds: No wheezing.  Abdominal:     Palpations: Abdomen is soft.     Tenderness: There is no abdominal tenderness.  Skin:    General: Skin is warm.  Neurological:     Mental Status: She is alert and oriented to person, place, and time. Mental status is at baseline.    ED Results / Procedures / Treatments   Labs (all labs ordered are listed, but only abnormal results are displayed) Labs Reviewed  BASIC METABOLIC PANEL - Abnormal; Notable for the following components:      Result Value   Potassium 3.1 (*)    CO2 18 (*)    Glucose, Bld 143 (*)    Anion gap 16 (*)    All other components within normal limits  CBC  HCG, SERUM, QUALITATIVE  D-DIMER, QUANTITATIVE  PREGNANCY, URINE  TROPONIN I (HIGH SENSITIVITY)  TROPONIN I (HIGH SENSITIVITY)    EKG EKG Interpretation  Date/Time:  Saturday March 13 2021 09:40:57 EST  Ventricular Rate:  111 PR Interval:  160 QRS Duration: 89 QT Interval:  346 QTC Calculation: 471 R Axis:   95 Text Interpretation: Sinus tachycardia Borderline right axis deviation Confirmed by Coralee Pesa [phone]) on 03/13/2021 10:32:09 AM  Radiology DG Chest 2 View  Result Date: 03/13/2021 CLINICAL DATA:  Chest pain EXAM: CHEST - 2 VIEW COMPARISON:  04/15/2020 FINDINGS: The heart size and mediastinal contours are within normal limits. Both lungs are clear. The visualized skeletal structures are unremarkable. IMPRESSION: No active cardiopulmonary disease. Electronically Signed   By: Signa Kell M.D.   On: 03/13/2021 10:14    Procedures Procedures    Medications Ordered in ED Medications  sodium chloride  0.9 % bolus 1,000 mL (1,000 mLs Intravenous New Bag/Given 03/13/21 1111)  ketorolac (TORADOL) 30 MG/ML injection 30 mg (30 mg Intravenous Given 03/13/21 1112)    ED Course/ Medical Decision Making/ A&P                           Medical Decision Making Amount and/or Complexity of Data Reviewed Labs: ordered. Radiology: ordered.  Risk Prescription drug management.   This patient presents to the ED for concern of chest pain, this involves an extensive number of treatment options, and is a complaint that carries with it a high risk of complications and morbidity.  The differential diagnosis includes musculoskeletal pain, ACS, PE, dissection   Additional history obtained: -Additional history obtained from interpreter -External records from outside source obtained and reviewed including: Chart review including previous notes, labs, imaging, consultation notes   Lab Tests: -I ordered, reviewed, and interpreted labs.  The pertinent results include: Blood work which is reassuring, 2 negative troponins with no delta, negative D-dimer   EKG -Sinus tachycardia   Imaging Studies ordered: -I ordered imaging studies including chest x-ray -I independently visualized and interpreted imaging which showed no acute findings -I agree with the radiologist interpretation   Medicines ordered and prescription drug management: -I ordered medication including nonnarcotic pain medicine for symptom management -Reevaluation of the patient after these medicines showed that the patient resolved -I have reviewed the patients home medicines and have made adjustments as needed   ED Course: 41 year old female presents emergency department left-sided chest pain.  Was tachycardic on arrival, complaining of associated shortness of breath, tachypneic.  Nontoxic-appearing.  EKG shows sinus tachycardia without any other acute abnormalities.  Blood work is reassuring, troponins are negative, D-dimer is negative.   After addressing her pain and IV hydration patient feels significantly better.  She believes the chest pain is completely resolved.  Low suspicion for ACS, PE.  Low suspicion for dissection at this time.  Patient had similar episode last year with similar complaints, followed up with cardiology as an outpatient.  Plan for same outpatient follow-up.   Cardiac Monitoring: The patient was maintained on a cardiac monitor.  I personally viewed and interpreted the cardiac monitored which showed an underlying rhythm of: Sinus rhythm   Reevaluation: After the interventions noted above, I reevaluated the patient and found that they have :resolved   Dispostion: Patient at this time appears safe and stable for discharge and close outpatient follow up. Discharge plan and strict return to ED precautions discussed, patient verbalizes understanding and agreement.        Final Clinical Impression(s) / ED Diagnoses Final diagnoses:  None    Rx / DC Orders ED Discharge Orders     None  Rozelle Logan, DO 03/13/21 1514

## 2021-10-22 ENCOUNTER — Other Ambulatory Visit: Payer: Self-pay

## 2021-10-22 ENCOUNTER — Encounter (HOSPITAL_BASED_OUTPATIENT_CLINIC_OR_DEPARTMENT_OTHER): Payer: Self-pay | Admitting: *Deleted

## 2021-10-22 DIAGNOSIS — M546 Pain in thoracic spine: Secondary | ICD-10-CM | POA: Insufficient documentation

## 2021-10-22 DIAGNOSIS — R0789 Other chest pain: Secondary | ICD-10-CM | POA: Insufficient documentation

## 2021-10-22 LAB — CBC
HCT: 37.4 % (ref 36.0–46.0)
Hemoglobin: 12.8 g/dL (ref 12.0–15.0)
MCH: 30.2 pg (ref 26.0–34.0)
MCHC: 34.2 g/dL (ref 30.0–36.0)
MCV: 88.2 fL (ref 80.0–100.0)
Platelets: 250 10*3/uL (ref 150–400)
RBC: 4.24 MIL/uL (ref 3.87–5.11)
RDW: 13 % (ref 11.5–15.5)
WBC: 10.4 10*3/uL (ref 4.0–10.5)
nRBC: 0 % (ref 0.0–0.2)

## 2021-10-22 NOTE — ED Triage Notes (Signed)
Pt is here for left sided chest pain which began this this pm and has been associated with some sob and dizziness.

## 2021-10-23 ENCOUNTER — Emergency Department (HOSPITAL_BASED_OUTPATIENT_CLINIC_OR_DEPARTMENT_OTHER)
Admission: EM | Admit: 2021-10-23 | Discharge: 2021-10-23 | Disposition: A | Discharge status: Home / Self Care | Payer: Self-pay | Attending: Emergency Medicine | Admitting: Emergency Medicine

## 2021-10-23 ENCOUNTER — Emergency Department (HOSPITAL_BASED_OUTPATIENT_CLINIC_OR_DEPARTMENT_OTHER): Payer: Self-pay

## 2021-10-23 DIAGNOSIS — R079 Chest pain, unspecified: Secondary | ICD-10-CM

## 2021-10-23 LAB — BASIC METABOLIC PANEL
Anion gap: 16 — ABNORMAL HIGH (ref 5–15)
BUN: 10 mg/dL (ref 6–20)
CO2: 19 mmol/L — ABNORMAL LOW (ref 22–32)
Calcium: 9.1 mg/dL (ref 8.9–10.3)
Chloride: 102 mmol/L (ref 98–111)
Creatinine, Ser: 0.67 mg/dL (ref 0.44–1.00)
GFR, Estimated: >60 mL/min (ref >60)
Glucose, Bld: 116 mg/dL — ABNORMAL HIGH (ref 70–99)
Potassium: 3.3 mmol/L — ABNORMAL LOW (ref 3.5–5.1)
Sodium: 137 mmol/L (ref 135–145)

## 2021-10-23 LAB — TROPONIN I (HIGH SENSITIVITY)
Troponin I (High Sensitivity): <2 ng/L (ref <18)
Troponin I (High Sensitivity): <2 ng/L (ref <18)

## 2021-10-23 LAB — PREGNANCY, URINE: Preg Test, Ur: NEGATIVE

## 2021-10-23 LAB — D-DIMER, QUANTITATIVE: D-Dimer, Quant: 0.32 ug/mL-FEU (ref 0.00–0.50)

## 2021-10-23 MED ORDER — KETOROLAC TROMETHAMINE 15 MG/ML IJ SOLN
15.0000 mg | Freq: Once | INTRAMUSCULAR | Status: AC
  Administered 2021-10-23: 15 mg via INTRAVENOUS
  Filled 2021-10-23: qty 1

## 2021-10-23 NOTE — ED Provider Notes (Signed)
[facility] EMERGENCY DEPT Provider Note  CSN: [id number] Arrival date & time: 10/22/21 2334  Chief Complaint(s) Chest Pain  HPI Joanna Whitney is a 41 y.o. female     Chest Pain Pain location:  L chest Pain quality: pressure   Pain radiates to:  Does not radiate Pain severity:  Moderate Onset quality:  Sudden Duration:  7 hours Timing:  Constant Progression:  Unchanged Chronicity:  New Relieved by:  Nothing Worsened by:  Deep breathing Associated symptoms: shortness of breath   Associated symptoms: no anxiety, no cough, no fever, no nausea, no palpitations and no vomiting     Past Medical History Past Medical History:  Diagnosis Date   Gestational diabetes    Hx of toxoplasmosis    Medical history non-contributory    Patient Active Problem List   Diagnosis Date Noted   History of gestational diabetes 06/13/2017   Obesity (BMI 30-39.9) 03/09/2017   Language barrier 03/09/2017   Home Medication(s) Prior to Admission medications   Not on File                                                                                                                                    Allergies Patient has no known allergies.  Review of Systems Review of Systems  Constitutional:  Negative for fever.  Respiratory:  Positive for shortness of breath. Negative for cough.   Cardiovascular:  Positive for chest pain. Negative for palpitations.  Gastrointestinal:  Negative for nausea and vomiting.   As noted in HPI  Physical Exam Vital Signs  I have reviewed the triage vital signs BP 115/63   Pulse 85   Temp 98.5 F (36.9 C)   Resp 19   Wt 79.4 kg   SpO2 97%   BMI 30.04 kg/m   Physical Exam Vitals reviewed.  Constitutional:      General: She is not in acute distress.    Appearance: She is well-developed. She is not diaphoretic.  HENT:     Head: Normocephalic and atraumatic.     Nose: Nose normal.  Eyes:     General: No scleral icterus.        Right eye: No discharge.        Left eye: No discharge.     Conjunctiva/sclera: Conjunctivae normal.     Pupils: Pupils are equal, round, and reactive to light.  Cardiovascular:     Rate and Rhythm: Normal rate and regular rhythm.     Heart sounds: No murmur heard.    No friction rub. No gallop.  Pulmonary:     Effort: Pulmonary effort is normal. No respiratory distress.     Breath sounds: Normal breath sounds. No stridor. No rales.  Chest:     Chest wall: Tenderness present.    Abdominal:     General: There is no distension.     Palpations: Abdomen is soft.     Tenderness:  There is no abdominal tenderness.  Musculoskeletal:     Cervical back: Normal range of motion and neck supple.     Thoracic back: Spasms and tenderness present.       Back:  Skin:    General: Skin is warm and dry.     Findings: No erythema or rash.  Neurological:     Mental Status: She is alert and oriented to person, place, and time.     ED Results and Treatments Labs (all labs ordered are listed, but only abnormal results are displayed) Labs Reviewed  BASIC METABOLIC PANEL - Abnormal; Notable for the following components:      Result Value   Potassium 3.3 (*)    CO2 19 (*)    Glucose, Bld 116 (*)    Anion gap 16 (*)    All other components within normal limits  CBC  PREGNANCY, URINE  D-DIMER, QUANTITATIVE  TROPONIN I (HIGH SENSITIVITY)  TROPONIN I (HIGH SENSITIVITY)                                                                                                                         EKG  EKG Interpretation  Date/Time:  Friday October 22 2021 23:42:27 EDT Ventricular Rate:  92 PR Interval:  134 QRS Duration: 82 QT Interval:  392 QTC Calculation: 484 R Axis:   85 Text Interpretation: Normal sinus rhythm Prolonged QT Abnormal ECG When compared with ECG of 13-Mar-2021 09:40, PREVIOUS ECG IS PRESENT Confirmed by Drema Pry [phone]) on 10/23/2021 2:51:58 AM       Radiology DG  Chest Port 1 View  Result Date: 10/23/2021 CLINICAL DATA:  Left-sided chest pain for several hours, initial encounter EXAM: PORTABLE CHEST 1 VIEW COMPARISON:  03/13/2021 FINDINGS: The heart size and mediastinal contours are within normal limits. Both lungs are clear. The visualized skeletal structures are unremarkable. IMPRESSION: No active disease. Electronically Signed   By: Alcide Clever M.D.   On: 10/23/2021 02:28    Medications Ordered in ED Medications  ketorolac (TORADOL) 15 MG/ML injection 15 mg (15 mg Intravenous Given 10/23/21 0423)                                                                                                                                     Procedures .1-3 Lead EKG Interpretation  Performed by: Nira Conn, MD Authorized by: Nira Conn, MD  Interpretation: normal     ECG rate:  80-110   ECG rate assessment: tachycardic     Rhythm: sinus rhythm     Ectopy: none     Conduction: normal     (including critical care time)  Medical Decision Making / ED Course   Medical Decision Making Amount and/or Complexity of Data Reviewed Labs: ordered. Radiology: ordered.  Risk Prescription drug management.    Left-sided chest pain and shortness of breath. Intermittently tachycardic. Will assess for pneumonia, pneumothorax, ACS, PE. Given patient's tenderness to palpation likely MSK.  EKG without acute ischemic changes or evidence of pericarditis. Heart score less than 3. Troponins negative x2 Sufficient to rule out ACS  Low pretest probability for pulmonary embolism and dimer negative. Presentation not classic for aortic dissection or esophageal duration. On my read of the chest x-ray, there was no evidence suggestive of pneumonia, pneumothorax, pneumomediastinum, pulmonary edema concerning for new or exacerbation of heart failure, abnormal contour of the mediastinum to suggest dissection, and no evidence of acute injuries.  CBC  without leukocytosis or anemia. Metabolic panel with mild hypokalemia acidosis similar to prior.  Treated with IV Toradol which provided relief       Final Clinical Impression(s) / ED Diagnoses Final diagnoses:  Left-sided chest pain   The patient appears reasonably screened and/or stabilized for discharge and I doubt any other medical condition or other [facility] requiring further screening, evaluation, or treatment in the ED at this time. I have discussed the findings, Dx and Tx plan with the patient/family who expressed understanding and agree(s) with the plan. Discharge instructions discussed at length. The patient/family was given strict return precautions who verbalized understanding of the instructions. No further questions at time of discharge.  Disposition: Discharge  Condition: Good  ED Discharge Orders     None       Follow Up: Primary care provider  Call  to schedule an appointment for close follow up           This chart was dictated using voice recognition software.  Despite best efforts to proofread,  errors can occur which can change the documentation meaning.    Nira Conn, MD 10/23/21 [phone]

## 2021-10-23 NOTE — Discharge Instructions (Addendum)
Puede tomar Motrin (Ibuprofen) o Aleve (Naproxen), Acetaminophen (Tylenol), crema para los musculos como SalonPas, Icy Hot, Bengay, etc. Puede estrechar, ponerce hielo o comprecion de calor, o que le den masaje. ° °

## 2021-10-24 ENCOUNTER — Emergency Department (HOSPITAL_BASED_OUTPATIENT_CLINIC_OR_DEPARTMENT_OTHER): Payer: Self-pay | Admitting: Radiology

## 2021-10-24 ENCOUNTER — Emergency Department (HOSPITAL_BASED_OUTPATIENT_CLINIC_OR_DEPARTMENT_OTHER)
Admission: EM | Admit: 2021-10-24 | Discharge: 2021-10-25 | Disposition: A | Discharge status: Home / Self Care | Payer: Self-pay | Attending: Emergency Medicine | Admitting: Emergency Medicine

## 2021-10-24 ENCOUNTER — Other Ambulatory Visit: Payer: Self-pay

## 2021-10-24 ENCOUNTER — Encounter (HOSPITAL_BASED_OUTPATIENT_CLINIC_OR_DEPARTMENT_OTHER): Payer: Self-pay

## 2021-10-24 DIAGNOSIS — R0789 Other chest pain: Secondary | ICD-10-CM | POA: Insufficient documentation

## 2021-10-24 DIAGNOSIS — R0602 Shortness of breath: Secondary | ICD-10-CM | POA: Insufficient documentation

## 2021-10-24 DIAGNOSIS — F419 Anxiety disorder, unspecified: Secondary | ICD-10-CM | POA: Insufficient documentation

## 2021-10-24 LAB — CBC
HCT: 35.8 % — ABNORMAL LOW (ref 36.0–46.0)
Hemoglobin: 12.2 g/dL (ref 12.0–15.0)
MCH: 30.1 pg (ref 26.0–34.0)
MCHC: 34.1 g/dL (ref 30.0–36.0)
MCV: 88.4 fL (ref 80.0–100.0)
Platelets: 240 10*3/uL (ref 150–400)
RBC: 4.05 MIL/uL (ref 3.87–5.11)
RDW: 12.8 % (ref 11.5–15.5)
WBC: 10 10*3/uL (ref 4.0–10.5)
nRBC: 0 % (ref 0.0–0.2)

## 2021-10-24 LAB — BASIC METABOLIC PANEL
Anion gap: 12 (ref 5–15)
BUN: 10 mg/dL (ref 6–20)
CO2: 19 mmol/L — ABNORMAL LOW (ref 22–32)
Calcium: 9.2 mg/dL (ref 8.9–10.3)
Chloride: 104 mmol/L (ref 98–111)
Creatinine, Ser: 0.8 mg/dL (ref 0.44–1.00)
GFR, Estimated: >60 mL/min (ref >60)
Glucose, Bld: 108 mg/dL — ABNORMAL HIGH (ref 70–99)
Potassium: 3.5 mmol/L (ref 3.5–5.1)
Sodium: 135 mmol/L (ref 135–145)

## 2021-10-24 LAB — HCG, SERUM, QUALITATIVE: Preg, Serum: NEGATIVE

## 2021-10-24 LAB — TROPONIN I (HIGH SENSITIVITY): Troponin I (High Sensitivity): <2 ng/L (ref <18)

## 2021-10-24 NOTE — ED Triage Notes (Signed)
Called For Triage. Currently in Restroom.

## 2021-10-24 NOTE — ED Triage Notes (Signed)
Patient here POV from Home.  Endorses CP associated with SOB and Upper Back Pain for 4-5 Days. Seen in ED Recently for Same and Discharged. Returns as SOB has worsened and Pain is now more Pressure-Like.   NAD Noted during Triage. A&Ox4. GCS 15. Ambulatory.

## 2021-10-25 ENCOUNTER — Other Ambulatory Visit: Payer: Self-pay

## 2021-10-25 LAB — TROPONIN I (HIGH SENSITIVITY): Troponin I (High Sensitivity): <2 ng/L (ref <18)

## 2021-10-25 MED ORDER — KETOROLAC TROMETHAMINE 30 MG/ML IJ SOLN
30.0000 mg | Freq: Once | INTRAMUSCULAR | Status: AC
  Administered 2021-10-25: 30 mg via INTRAVENOUS
  Filled 2021-10-25: qty 1

## 2021-10-25 NOTE — ED Provider Notes (Signed)
[facility] EMERGENCY DEPT Provider Note   CSN: [id number] Arrival date & time: 10/24/21  2206     History  Chief Complaint  Patient presents with   Shortness of Breath    Joanna Whitney is a 41 y.o. female.  HPI     This is a 41 year old female who presents with chest pain.  Patient reports worsening left upper chest pain.  She has had pain over the last 4 to 5 days.  She was seen and evaluated on 9/30 for the same.  At that time she had full work-up including troponin and D-dimer which were reassuring.  Patient is not taking anything for pain at home.  There is no worsening of pain with exertion or with eating.  She has no known history of high blood pressure, high cholesterol, diabetes.  No early family history of heart disease.  Denies recent fevers or cough.  Home Medications Prior to Admission medications   Not on File      Allergies    Patient has no known allergies.    Review of Systems   Review of Systems  Constitutional:  Negative for fever.  Respiratory:  Positive for shortness of breath. Negative for cough.   Cardiovascular:  Positive for chest pain.  All other systems reviewed and are negative.   Physical Exam Updated Vital Signs BP (!) 107/56   Pulse 61   Temp 97.6 F (36.4 C) (Oral)   Resp 14   Ht 1.626 m (5\' 4" )   Wt 79.4 kg   SpO2 99%   BMI 30.05 kg/m  Physical Exam Vitals and nursing note reviewed.  Constitutional:      Appearance: She is well-developed. She is not ill-appearing.  HENT:     Head: Normocephalic and atraumatic.  Eyes:     Pupils: Pupils are equal, round, and reactive to light.  Cardiovascular:     Rate and Rhythm: Normal rate and regular rhythm.     Heart sounds: Normal heart sounds.  Pulmonary:     Effort: Pulmonary effort is normal. No respiratory distress.     Breath sounds: No wheezing.  Chest:     Chest wall: Tenderness present.  Abdominal:     General: Bowel sounds are normal.      Palpations: Abdomen is soft.  Musculoskeletal:     Cervical back: Neck supple.  Skin:    General: Skin is warm and dry.  Neurological:     Mental Status: She is alert and oriented to person, place, and time.  Psychiatric:        Mood and Affect: Mood is anxious.     ED Results / Procedures / Treatments   Labs (all labs ordered are listed, but only abnormal results are displayed) Labs Reviewed  BASIC METABOLIC PANEL - Abnormal; Notable for the following components:      Result Value   CO2 19 (*)    Glucose, Bld 108 (*)    All other components within normal limits  CBC - Abnormal; Notable for the following components:   HCT 35.8 (*)    All other components within normal limits  HCG, SERUM, QUALITATIVE  TROPONIN I (HIGH SENSITIVITY)  TROPONIN I (HIGH SENSITIVITY)    EKG None  Radiology DG Chest 2 View  Result Date: 10/24/2021 CLINICAL DATA:  Chest pain and shortness of breath. Upper back pain. EXAM: CHEST - 2 VIEW COMPARISON:  10/23/2021. FINDINGS: The heart size and mediastinal contours are within normal limits. Both lungs are  clear. No acute osseous abnormality. IMPRESSION: No active cardiopulmonary disease. Electronically Signed   By: Thornell Sartorius M.D.   On: 10/24/2021 22:32    Procedures Procedures    Medications Ordered in ED Medications  ketorolac (TORADOL) 30 MG/ML injection 30 mg (30 mg Intravenous Given 10/25/21 0101)    ED Course/ Medical Decision Making/ A&P                           Medical Decision Making Amount and/or Complexity of Data Reviewed Labs: ordered. Radiology: ordered.  Risk Prescription drug management.   This patient presents to the ED for concern of chest pain, shortness of breath, this involves an extensive number of treatment options, and is a complaint that carries with it a high risk of complications and morbidity.  I considered the following differential and admission for this acute, potentially life threatening condition.  The  differential diagnosis includes ACS, PE, pneumonia, pneumothorax, musculoskeletal  MDM:    This is a 41 year old female who presents with ongoing chest pain and shortness of breath.  Was seen and evaluated several days ago for the same.  Had a full work-up at that time including troponins and a D-dimer that was negative.  She appears quite anxious on exam.  She has some reproducible chest wall tenderness.  She is low risk for ACS.  EKG without any acute ischemic arrhythmic changes.  Chest x-ray without pneumothorax or pneumonia.  Troponin x2 negative.  She had negative D-dimer several days ago.  Feel that this is adequate to rule out PE.  Patient had significant improvement with Toradol.  Her pain is quite atypical.  Have low suspicion for acute emergent process.  Recommend ibuprofen and follow-up with her primary physician.  (Labs, imaging, consults)  Labs: I Ordered, and personally interpreted labs.  The pertinent results include: CBC, BMP, troponin x2  Imaging Studies ordered: I ordered imaging studies including chest x-ray I independently visualized and interpreted imaging. I agree with the radiologist interpretation  Additional history obtained from son at bedside.  External records from outside source obtained and reviewed including recent evaluations  Cardiac Monitoring: The patient was maintained on a cardiac monitor.  I personally viewed and interpreted the cardiac monitored which showed an underlying rhythm of: Sinus rhythm  Reevaluation: After the interventions noted above, I reevaluated the patient and found that they have :improved  Social Determinants of Health:  lives independently  Disposition: Discharge  Co morbidities that complicate the patient evaluation  Past Medical History:  Diagnosis Date   Gestational diabetes    Hx of toxoplasmosis    Medical history non-contributory      Medicines Meds ordered this encounter  Medications   ketorolac (TORADOL) 30  MG/ML injection 30 mg    I have reviewed the patients home medicines and have made adjustments as needed  Problem List / ED Course: Problem List Items Addressed This Visit   None Visit Diagnoses     Atypical chest pain    -  Primary   Relevant Orders   Ambulatory referral to Cardiology                   Final Clinical Impression(s) / ED Diagnoses Final diagnoses:  Atypical chest pain    Rx / DC Orders ED Discharge Orders          Ordered    Ambulatory referral to Cardiology       Comments: If you  have not heard from the Cardiology office within the next 72 hours please call [phone].   10/25/21 0206              Dina Rich, Barbette Hair, MD 10/25/21 2308

## 2021-10-25 NOTE — Discharge Instructions (Signed)
You were seen today for chest pain.  Your work-up is reassuring.  Take ibuprofen and follow-up with cardiology.

## 2021-11-05 ENCOUNTER — Encounter: Payer: Self-pay | Admitting: *Deleted

## 2022-02-20 ENCOUNTER — Emergency Department (HOSPITAL_COMMUNITY): Payer: Self-pay

## 2022-02-20 ENCOUNTER — Encounter (HOSPITAL_COMMUNITY): Payer: Self-pay

## 2022-02-20 ENCOUNTER — Emergency Department (HOSPITAL_COMMUNITY)
Admission: EM | Admit: 2022-02-20 | Discharge: 2022-02-20 | Disposition: A | Discharge status: Home / Self Care | Payer: Self-pay | Attending: Emergency Medicine | Admitting: Emergency Medicine

## 2022-02-20 DIAGNOSIS — W010XXA Fall on same level from slipping, tripping and stumbling without subsequent striking against object, initial encounter: Secondary | ICD-10-CM | POA: Insufficient documentation

## 2022-02-20 DIAGNOSIS — S82832A Other fracture of upper and lower end of left fibula, initial encounter for closed fracture: Secondary | ICD-10-CM | POA: Insufficient documentation

## 2022-02-20 DIAGNOSIS — S8262XA Displaced fracture of lateral malleolus of left fibula, initial encounter for closed fracture: Secondary | ICD-10-CM | POA: Insufficient documentation

## 2022-02-20 DIAGNOSIS — S82892A Other fracture of left lower leg, initial encounter for closed fracture: Secondary | ICD-10-CM

## 2022-02-20 MED ORDER — FENTANYL CITRATE PF 50 MCG/ML IJ SOSY
50.0000 ug | PREFILLED_SYRINGE | Freq: Once | INTRAMUSCULAR | Status: AC
  Administered 2022-02-20: 50 ug via INTRAMUSCULAR
  Filled 2022-02-20: qty 1

## 2022-02-20 MED ORDER — OXYCODONE-ACETAMINOPHEN 5-325 MG PO TABS: 1.0000 | ORAL_TABLET | Freq: Four times a day (QID) | ORAL | 0 refills | Status: DC | PRN

## 2022-02-20 MED ORDER — MORPHINE SULFATE (PF) 4 MG/ML IV SOLN
4.0000 mg | Freq: Once | INTRAVENOUS | Status: AC
  Administered 2022-02-20: 4 mg via INTRAVENOUS
  Filled 2022-02-20: qty 1

## 2022-02-20 NOTE — ED Provider Notes (Signed)
  [place] EMERGENCY DEPARTMENT AT [facility] Provider Procedure Note   Radiology DG Foot Complete Left  Result Date: 02/20/2022 CLINICAL DATA:  Pain after fall. EXAM: LEFT FOOT - COMPLETE 3+ VIEW COMPARISON:  None Available. FINDINGS: There is no evidence of fracture or dislocation. There is no evidence of arthropathy or other focal bone abnormality. Soft tissues are unremarkable. IMPRESSION: Negative. Electronically Signed   By: Dorise Bullion III M.D.   On: 02/20/2022 14:18   DG Ankle Complete Left  Result Date: 02/20/2022 CLINICAL DATA:  Pain after fall.  Obvious deformity. EXAM: LEFT ANKLE COMPLETE - 3+ VIEW COMPARISON:  None Available. FINDINGS: There is a displaced fracture of the distal fibula. There appears to be a posterior mid malleolar fracture is well. There is disruption of the ankle mortise with significant medial widening. IMPRESSION: 1. Displaced fracture of the distal fibula. 2. Posterior malleolar fracture. 3. Disruption of the ankle mortise with significant medial widening. Electronically Signed   By: Dorise Bullion III M.D.   On: 02/20/2022 14:16    Procedures .Ortho Injury Treatment  Date/Time: 02/20/2022 2:56 PM  Performed by: Valarie Merino, MD Authorized by: Valarie Merino, MD   Consent:    Consent obtained:  Verbal   Consent given by:  Patient   Risks discussed:  Fracture, irreducible dislocation, recurrent dislocation, nerve damage, restricted joint movement, stiffness and vascular damage   Alternatives discussed:  No treatmentInjury location: ankle Location details: left ankle Injury type: fracture-dislocation Fracture type: lateral malleolus Pre-procedure neurovascular assessment: neurovascularly intact Pre-procedure distal perfusion: normal Pre-procedure neurological function: normal Pre-procedure range of motion: normal  Anesthesia: Local anesthesia used: no  Patient sedated: NoManipulation performed: yes Skeletal traction used:  yes Reduction successful: yes X-ray confirmed reduction: yes Immobilization: splint Splint type: ankle stirrup and short leg Splint Applied by: ED Provider Supplies used: Ortho-Glass Post-procedure neurovascular assessment: post-procedure neurovascularly intact Post-procedure distal perfusion: normal Post-procedure neurological function: normal Post-procedure range of motion: normal       Medications Ordered in ED Medications  fentaNYL (SUBLIMAZE) injection 50 mcg (50 mcg Intramuscular Given 02/20/22 1342)  morphine (PF) 4 MG/ML injection 4 mg (4 mg Intravenous Given 02/20/22 1428)     Valarie Merino, MD 02/20/22 1457

## 2022-02-20 NOTE — ED Provider Notes (Signed)
[place] EMERGENCY DEPARTMENT AT [facility] Provider Note   CSN: [id number] Arrival date & time: 02/20/22  1310     History Chief Complaint  Patient presents with   Ankle Pain    Joanna Whitney is a 42 y.o. female.  Patient presents emerged department complaints of left ankle pain.  I saw this patient in triage prior to this examination.  Patient had an obvious gross deformity of the left ankle.  She reports that she was mopping earlier today she slipped and tripped over her ankle and landed on it.  She reports the ankle was obviously deformed immediately after this injury and has been told to place ankle back to somewhat proper alignment.  Patient reports that ankle is still significantly painful but denies any loss of function or sensation.  Patient not currently any blood thinners.   Ankle Pain Associated symptoms: no fever        Home Medications Prior to Admission medications   Medication Sig Start Date End Date Taking? Authorizing Provider  oxyCODONE-acetaminophen (PERCOCET/ROXICET) 5-325 MG tablet Take 1 tablet by mouth every 6 (six) hours as needed for severe pain. 02/20/22  Yes Luvenia Heller, PA-C      Allergies    Patient has no known allergies.    Review of Systems   Review of Systems  Constitutional:  Negative for fever.  Cardiovascular:  Negative for chest pain.  Musculoskeletal:  Positive for joint swelling.  Neurological:  Negative for dizziness, weakness and numbness.  All other systems reviewed and are negative.   Physical Exam Updated Vital Signs BP (!) 95/57   Pulse 71   Temp 98 F (36.7 C) (Oral)   Resp 20   SpO2 100%  Physical Exam Vitals and nursing note reviewed.  Constitutional:      General: She is not in acute distress.    Appearance: Normal appearance. She is not ill-appearing.  HENT:     Head: Normocephalic and atraumatic.  Eyes:     Conjunctiva/sclera: Conjunctivae normal.  Cardiovascular:     Rate and  Rhythm: Normal rate and regular rhythm.     Pulses: Normal pulses.     Heart sounds: Normal heart sounds.  Pulmonary:     Effort: Pulmonary effort is normal.     Breath sounds: Normal breath sounds.  Musculoskeletal:        General: Swelling, tenderness, deformity and signs of injury present.     Comments: Obvious gross deformity of left ankle.  Patient unable to move the left foot or ankle due to significant pain.  Did not assess range of motion given obvious fracture  Skin:    General: Skin is warm and dry.     Capillary Refill: Capillary refill takes less than 2 seconds.     Findings: No lesion or rash.  Neurological:     General: No focal deficit present.     Mental Status: She is alert.     ED Results / Procedures / Treatments   Labs (all labs ordered are listed, but only abnormal results are displayed) Labs Reviewed - No data to display  EKG None  Radiology DG Ankle 2 Views Left  Result Date: 02/20/2022 CLINICAL DATA:  Fracture, postreduction. EXAM: LEFT ANKLE - 2 VIEW COMPARISON:  Pre reduction radiographs earlier today. FINDINGS: Improved alignment post reduction. Decreased displacement and angulation of the distal fibular fracture. Decreased displacement posterior tibial tubercle fracture. Decreased lateral subluxation of the talus. There is residual widening of the  medial clear space. Overlying splint material limits osseous and soft tissue fine detail. IMPRESSION: Improved alignment of distal fibular and tibial fractures post reduction. Decreased lateral subluxation of the talus with residual widening of the medial clear space. Electronically Signed   By: Keith Rake M.D.   On: 02/20/2022 14:55   DG Foot Complete Left  Result Date: 02/20/2022 CLINICAL DATA:  Pain after fall. EXAM: LEFT FOOT - COMPLETE 3+ VIEW COMPARISON:  None Available. FINDINGS: There is no evidence of fracture or dislocation. There is no evidence of arthropathy or other focal bone abnormality.  Soft tissues are unremarkable. IMPRESSION: Negative. Electronically Signed   By: Dorise Bullion III M.D.   On: 02/20/2022 14:18   DG Ankle Complete Left  Result Date: 02/20/2022 CLINICAL DATA:  Pain after fall.  Obvious deformity. EXAM: LEFT ANKLE COMPLETE - 3+ VIEW COMPARISON:  None Available. FINDINGS: There is a displaced fracture of the distal fibula. There appears to be a posterior mid malleolar fracture is well. There is disruption of the ankle mortise with significant medial widening. IMPRESSION: 1. Displaced fracture of the distal fibula. 2. Posterior malleolar fracture. 3. Disruption of the ankle mortise with significant medial widening. Electronically Signed   By: Dorise Bullion III M.D.   On: 02/20/2022 14:16    Procedures Procedures   Medications Ordered in ED Medications  fentaNYL (SUBLIMAZE) injection 50 mcg (50 mcg Intramuscular Given 02/20/22 1342)  morphine (PF) 4 MG/ML injection 4 mg (4 mg Intravenous Given 02/20/22 1428)    ED Course/ Medical Decision Making/ A&P Clinical Course as of 02/20/22 1717  Sun Feb 20, 2022  1457 DG Ankle 2 Views Left [OZ]    Clinical Course User Index [OZ] Luvenia Heller, PA-C                            Medical Decision Making Amount and/or Complexity of Data Reviewed Radiology: ordered. Decision-making details documented in ED Course.  Risk Prescription drug management.   This patient presents to the ED for concern of left ankle pain.  Differential diagnosis includes ankle fracture, fibular fracture, malleolus fracture, ankle sprain   Imaging Studies ordered:  I ordered imaging studies including left ankle x-ray, left foot x-ray, repeat left ankle x-ray I independently visualized and interpreted imaging which showed left fibular distal fracture, left malleolus fracture, improved alignment of fractures postreduction I agree with the radiologist interpretation   Medicines ordered and prescription drug management:  I ordered  medication including fentanyl, morphine for pain Reevaluation of the patient after these medicines showed that the patient improved I have reviewed the patients home medicines and have made adjustments as needed   Problem List / ED Course:  Patient presented to the ED following left ankle injury.  She reports that she was mopping when she slipped and fell on her left ankle, she reports that this resulted in ankle having obvious gross deformity.  She states that has been held to place ankle back in to somewhat correct alignment. Imaging was confirming of fracture in left ankle at the distal fibula and malleolus. Dr. Francia Greaves performed reduction with my assistance. Reduction appeared to be successful with repeat imaging showing improved alignment. Consulted with Dr. Lyla Glassing, orthopedic surgeon, to discuss if there was need for more emergent intervention with his recommendation being that patient can follow-up outpatient with Dr. Kathaleen Bury in the next few days, avoid bearing weight on left ankle, and to keep left leg elevated.  Dr. Linna Caprice did not recommend admission or believe that evaluation in the ED from ortho was needed. Advised patient of these recommendations. Patient was agreeable with plan to discharge home with close outpatient follow up with ortho. Prescription for Percocet sent to patient's pharmacy for breakthrough pain as needed. All questions answered prior to patient discharge.   Final Clinical Impression(s) / ED Diagnoses Final diagnoses:  Other closed fracture of distal end of left fibula, initial encounter  Closed fracture of malleolus of left ankle, initial encounter    Rx / DC Orders ED Discharge Orders          Ordered    oxyCODONE-acetaminophen (PERCOCET/ROXICET) 5-325 MG tablet  Every 6 hours PRN        02/20/22 1657              Smitty Knudsen, PA-C 02/20/22 1719    Wynetta Fines, MD 02/20/22 2131

## 2022-02-20 NOTE — Discharge Instructions (Addendum)
Lo atendieron en el departamento de emergencias por fracturas de peron y [employer]. Despus de hablar con la ciruga ortopdica, recomiendan un seguimiento ambulatorio en su consultorio para Educational psychologist. Debes evitar cargar peso sobre la pierna izquierda y tratar de Theatre manager la pierna elevada. Regrese al departamento de emergencias si desarrolla prdida de sensacin o funcin de la pierna izquierda o si experimenta dificultad para respirar significativa.   You were seen in the emergency department for ankle fractures of the fibular and malleolus. After discussing with orthopedic surgery, they recommend outpatient follow up with their office for further evaluation. You should avoid bearing weight on the left leg and try to keep leg elevated. Please return to the emergency department if you develop loss of sensation or function of the left leg or experience significant shortness of breath.

## 2022-02-20 NOTE — ED Triage Notes (Signed)
Pt arrived via EMS, from home slip and fall on wet floor.  Left ankle deformity.    18 R AC 200 mcg fentanyl given

## 2022-02-28 NOTE — Progress Notes (Signed)
Anesthesia Review:  PCP: Cardiologist : Chest x-ray : EKG : Echo : Stress test: Cardiac Cath :  Activity level:  Sleep Study/ CPAP : Fasting Blood Sugar :      / Checks Blood Sugar -- times a day:   Blood Thinner/ Instructions /Last Dose: ASA / Instructions/ Last Dose :    Dm- TYPE  HGBA1C-    Requested orders on 03/01/22.

## 2022-03-01 NOTE — Patient Instructions (Signed)
SURGICAL WAITING ROOM VISITATION  Patients having surgery or a procedure may have no more than 2 support people in the waiting area - these visitors may rotate.    Children under the age of 69 must have an adult with them who is not the patient.  Due to an increase in RSV and influenza rates and associated hospitalizations, children ages 64 and under may not visit patients in [place].  If the patient needs to stay at the hospital during part of their recovery, the visitor guidelines for inpatient rooms apply. Pre-op nurse will coordinate an appropriate time for 1 support person to accompany patient in pre-op.  This support person may not rotate.    Please refer to the [facility] website for the visitor guidelines for Inpatients (after your surgery is over and you are in a regular room).       Your procedure is scheduled on:  03/03/2022    Report to [facility] Main Entrance    Report to admitting at  200 pm      Call this number if you have problems the morning of surgery [phone]   Do not eat food :After Midnight.   After Midnight you may have the following liquids until ______ AM/ PM DAY OF SURGERY  Water Non-Citrus Juices (without pulp, NO RED-Apple, White grape, White cranberry) Black Coffee (NO MILK/CREAM OR CREAMERS, sugar ok)  Clear Tea (NO MILK/CREAM OR CREAMERS, sugar ok) regular and decaf                             Plain Jell-O (NO RED)                                           Fruit ices (not with fruit pulp, NO RED)                                     Popsicles (NO RED)                                                               Sports drinks like Gatorade (NO RED)              Drink 2 Ensure/G2 drinks AT 10:00 PM the night before surgery.        The day of surgery:  Drink ONE (1) Pre-Surgery Clear Ensure or G2 at AM the morning of surgery. Drink in one sitting. Do not sip.  This drink was given to you during your hospital  pre-op  appointment visit. Nothing else to drink after completing the  Pre-Surgery Clear Ensure or G2.          If you have questions, please contact your surgeon's office.   FOLLOW BOWEL PREP AND ANY ADDITIONAL PRE OP INSTRUCTIONS YOU RECEIVED FROM YOUR SURGEON'S OFFICE!!!     Oral Hygiene is also important to reduce your risk of infection.  Remember - BRUSH YOUR TEETH THE MORNING OF SURGERY WITH YOUR REGULAR TOOTHPASTE  DENTURES WILL BE REMOVED PRIOR TO SURGERY PLEASE DO NOT APPLY "Poly grip" OR ADHESIVES!!!   Do NOT smoke after Midnight   Take these medicines the morning of surgery with A SIP OF WATER:   DO NOT TAKE ANY ORAL DIABETIC MEDICATIONS DAY OF YOUR SURGERY  Bring CPAP mask and tubing day of surgery.                              You may not have any metal on your body including hair pins, jewelry, and body piercing             Do not wear make-up, lotions, powders, perfumes/cologne, or deodorant  Do not wear nail polish including gel and S&S, artificial/acrylic nails, or any other type of covering on natural nails including finger and toenails. If you have artificial nails, gel coating, etc. that needs to be removed by a nail salon please have this removed prior to surgery or surgery may need to be canceled/ delayed if the surgeon/ anesthesia feels like they are unable to be safely monitored.   Do not shave  48 hours prior to surgery.               Men may shave face and neck.   Do not bring valuables to the hospital. [place].   Contacts, glasses, dentures or bridgework may not be worn into surgery.   Bring small overnight bag day of surgery.   DO NOT [place]. PHARMACY WILL DISPENSE MEDICATIONS LISTED ON YOUR MEDICATION LIST TO YOU DURING YOUR ADMISSION [place]!    Patients discharged on the day of surgery will not be allowed to drive home.   Someone NEEDS to stay with you for the first 24 hours after anesthesia.   Special Instructions: Bring a copy of your healthcare power of attorney and living will documents the day of surgery if you haven't scanned them before.              Please read over the following fact sheets you were given: IF [place] [phone]   If you received a COVID test during your pre-op visit  it is requested that you wear a mask when out in public, stay away from anyone that may not be feeling well and notify your surgeon if you develop symptoms. If you test positive for Covid or have been in contact with anyone that has tested positive in the last 10 days please notify you surgeon.    [place] - Preparing for Surgery Before surgery, you can play an important role.  Because skin is not sterile, your skin needs to be as free of germs as possible.  You can reduce the number of germs on your skin by washing with CHG (chlorahexidine gluconate) soap before surgery.  CHG is an antiseptic cleaner which kills germs and bonds with the skin to continue killing germs even after washing. Please DO NOT use if you have an allergy to CHG or antibacterial soaps.  If your skin becomes reddened/irritated stop using the CHG and inform your nurse when you arrive at Short Stay. Do not shave (including legs and underarms) for at least  48 hours prior to the first CHG shower.  You may shave your face/neck. Please follow these instructions carefully:  1.  Shower with CHG Soap the night before surgery and the  morning of Surgery.  2.  If you choose to wash your hair, wash your hair first as usual with your  normal  shampoo.  3.  After you shampoo, rinse your hair and body thoroughly to remove the  shampoo.                           4.  Use CHG as you would any other liquid soap.  You can apply chg directly  to the skin and wash                       Gently with a scrungie or clean  washcloth.  5.  Apply the CHG Soap to your body ONLY FROM THE NECK DOWN.   Do not use on face/ open                           Wound or open sores. Avoid contact with eyes, ears mouth and genitals (private parts).                       Wash face,  Genitals (private parts) with your normal soap.             6.  Wash thoroughly, paying special attention to the area where your surgery  will be performed.  7.  Thoroughly rinse your body with warm water from the neck down.  8.  DO NOT shower/wash with your normal soap after using and rinsing off  the CHG Soap.                9.  Pat yourself dry with a clean towel.            10.  Wear clean pajamas.            11.  Place clean sheets on your bed the night of your first shower and do not  sleep with pets. Day of Surgery : Do not apply any lotions/deodorants the morning of surgery.  Please wear clean clothes to the hospital/surgery center.  FAILURE TO FOLLOW THESE INSTRUCTIONS MAY RESULT IN THE CANCELLATION OF YOUR SURGERY PATIENT SIGNATURE_________________________________  NURSE SIGNATURE__________________________________  ________________________________________________________________________

## 2022-03-02 ENCOUNTER — Encounter (HOSPITAL_COMMUNITY)
Admission: RE | Admit: 2022-03-02 | Discharge: 2022-03-02 | Disposition: A | Discharge status: Home / Self Care | Payer: Self-pay | Source: Ambulatory Visit

## 2022-03-02 ENCOUNTER — Other Ambulatory Visit: Payer: Self-pay

## 2022-03-02 ENCOUNTER — Encounter (HOSPITAL_COMMUNITY): Payer: Self-pay | Admitting: Orthopedic Surgery

## 2022-03-02 NOTE — H&P (Signed)
PREOPERATIVE H&P  Chief Complaint: left ankle pain  HPI: Joanna Whitney is a 42 y.o. female who presents referred from the emergency room for follow-up of left ankle fracture dislocation that occurred January 28.  She slipped on a wet floor at home twisted the ankle, and was unable to walk.  She had a closed reduction performed in the emergency room.  Pain is severe, using oxycodone, better with rest.  Elevation causes significant anterior pain.  She has been using crutches. She is otherwise healthy and is a non-smoker. Pain is limiting ADLs. She has elected surgical treatment.   Past Medical History:  Diagnosis Date   Gestational diabetes    Hx of toxoplasmosis    Medical history non-contributory    Past Surgical History:  Procedure Laterality Date   NO PAST SURGERIES     Social History   Socioeconomic History   Marital status: Married    Spouse name: Not on file   Number of children: Not on file   Years of education: Not on file   Highest education level: Not on file  Occupational History   Not on file  Tobacco Use   Smoking status: Never   Smokeless tobacco: Never  Substance and Sexual Activity   Alcohol use: No   Drug use: No   Sexual activity: Yes  Other Topics Concern   Not on file  Social History Narrative   Not on file   Social Determinants of Health   Financial Resource Strain: Not on file  Food Insecurity: Not on file  Transportation Needs: Not on file  Physical Activity: Not on file  Stress: Not on file  Social Connections: Not on file   Family History  Problem Relation Age of Onset   Diabetes Paternal Uncle    Hypertension Mother    No Known Allergies Prior to Admission medications   Medication Sig Start Date End Date Taking? Authorizing Provider  aspirin EC 81 MG tablet Take 81 mg by mouth daily. Swallow whole.   Yes [provider]  CALCIUM PO Take 1 tablet by mouth daily.   Yes [provider]  Multiple Vitamin  (MULTIVITAMIN WITH MINERALS) TABS tablet Take 1 tablet by mouth daily.   Yes [provider]  oxyCODONE (OXY IR/ROXICODONE) 5 MG immediate release tablet Take 5 mg by mouth every 4 (four) hours as needed for moderate pain. 02/25/22  Yes [provider]  potassium chloride (KLOR-CON) 10 MEQ tablet Take 10 mEq by mouth daily. 02/08/22  Yes [provider]  oxyCODONE-acetaminophen (PERCOCET/ROXICET) 5-325 MG tablet Take 1 tablet by mouth every 6 (six) hours as needed for severe pain. Patient not taking: Reported on 03/01/2022 02/20/22   Lourdes Sledge A, PA-C     Positive ROS: All other systems have been reviewed and were otherwise negative with the exception of those mentioned in the HPI and as above.  Physical Exam: General: Alert, no acute distress Cardiovascular: No pedal edema Respiratory: No cyanosis, no use of accessory musculature GI: No organomegaly, abdomen is soft and non-tender Skin: No lesions in the area of chief complaint Neurologic: Sensation intact distally Psychiatric: Patient is competent for consent with normal mood and affect Lymphatic: No axillary or cervical lymphadenopathy  MUSCULOSKELETAL: Left ankle has significant soft tissue swelling.  Positive ecchymosis diffusely.  Sensation intact throughout the ankle.  I removed her splint today and checked her skin, which does have significant swelling.  Imaging:3 views of the left ankle from the ER demonstrate fracture dislocation  with posterior malleolus involvement and syndesmotic disruption.  Assessment: Left ankle fracture dislocation status post closed reduction in the emergency room    Plan: Plan for Procedure(s): OPEN REDUCTION INTERNAL FIXATION (ORIF) ANKLE FRACTURE SYNDESMOSIS REPAIR  The risks benefits and alternatives were discussed with the patient including but not limited to the risks of nonoperative treatment, versus surgical intervention including infection, bleeding, nerve injury,   blood clots, cardiopulmonary complications, morbidity, mortality, among others, and they were willing to proceed.      Ventura Bruns, PA-C    03/02/2022 3:04 PM

## 2022-03-02 NOTE — Pre-Procedure Instructions (Signed)
PCP - [place] Cardiologist - Jory Sims, NP Dr.Gocha Geanie Berlin  PPM/ICD - pt denies Device Orders - n/a Rep Notified - n/a  EKG - 10/26/21 Stress Test - 12/01/21 ECHO - 06/16/20 Cardiac Cath - pt denies  Sleep Study/CPAP - pt denies  Diabetic- pt denies Fasting Blood Sugar -  Checks Blood Sugar _____ times a day  Last dose of GLP1 agonist-  pt denies GLP1 instructions: n/a  Blood Thinner Instructions:pt denies Aspirin Instructions:stop aspirin unless otherwise instructed by your surgeon   ERAS Protcol - YES  COVID TEST- n/a   Anesthesia review: YES, heart history.    Patient verbally denies any shortness of breath, fever, cough and chest pain during phone call.     -------------  SDW INSTRUCTIONS given:   Your procedure is scheduled on 03/03/22.             Report to [facility] Main Entrance "A" at  2:30 pm, and check in at the Admitting office.             Call this number if you have problems the morning of surgery:             [phone]               Remember:             Do not eat  after midnight the night before your surgery  Clear liquids until 1:45 pm: Juice (not-citric and without pulp - diabetics please choose diet or no sugar options); Water; Carbonated beverages - (diabetics please choose diet or no sugar options); Black Coffee Only (no creamer, milk or cream including half and half); Clear Tea; Gatorade (diabetics please choose diet or no sugar options)                           Take these medicines the morning of surgery with A SIP OF WATER Oxycodone PRN  As of today, STOP taking any Aspirin (unless otherwise instructed by your surgeon) Aleve, Naproxen, Ibuprofen, Motrin, Advil, Goody's, BC's, all herbal medications, fish oil, and all vitamins.                        Do not wear jewelry, make up, or nail polish            Do not wear lotions, powders, perfumes/colognes, or deodorant.            Do not shave 48 hours prior to  surgery.  Men may shave face and neck.            Do not bring valuables to the hospital.            [facility] is not responsible for any belongings or valuables.   Do NOT Smoke (Tobacco/Vaping) 24 hours prior to your procedure If you use a CPAP at night, you may bring all equipment for your overnight stay.   Contacts, glasses, dentures or partials may not be worn into surgery.      For patients admitted to the hospital, discharge time will be determined by your treatment team.   Patients discharged the day of surgery will not be allowed to drive home, and someone needs to stay with them for 24 hours.     Special instructions:   [facility]- Preparing For Surgery  Oral Hygiene is also important to reduce your risk of infection.  Remember -  BRUSH YOUR TEETH THE MORNING OF SURGERY WITH YOUR REGULAR TOOTHPASTE   Before surgery, you can play an important role. Because skin is not sterile, your skin needs to be as free of germs as possible. You can reduce the number of germs on your skin by washing with Dial (or any antibacterial) Soap before surgery.    Please do not use if you have an allergy to antibacterial soaps. If your skin becomes reddened/irritated stop using the Antibacterial Soap  Do not shave (including legs and underarms) for at least 48 hours prior to surgery. It is OK to shave your face.   Please follow these instructions carefully.              Shower the NIGHT BEFORE SURGERY and the MORNING OF SURGERY with (DIAL) Antibacterial Soap. Wash your body and hair with your normal shampoo/soap then rinse. Using a clean wash cloth wash from your neck down using the antibacterial soap, do not wash private area with the clean wash cloth. Wash thoroughly, paying special attention to the area where your surgery will be performed. Thoroughly rinse your body with warm water from the neck down.   Pat yourself dry with a CLEAN TOWEL.   Wear CLEAN PAJAMAS to bed the night before surgery.    Place CLEAN SHEETS on your bed the night of your surgery and DO NOT SLEEP WITH PETS.     Day of Surgery: Please shower morning of surgery using antibacterial soap Wear Clean/Comfortable clothing the morning of surgery Do not apply any deodorants/lotions.   Remember to brush your teeth WITH YOUR REGULAR TOOTHPASTE.   Questions were answered. Patient verbalized understanding of instructions.

## 2022-03-02 NOTE — Progress Notes (Signed)
Anesthesia Chart Review: Same-day workup  Long history of atypical chest pain and subjective palpitations with multiple benign evaluations.  She was seen by cardiology in 2022 and had echo and event monitor which were both completely benign.  She was recommended to reduce caffeine intake.  It was also discussed that she may benefit from counseling for possible panic attacks.  Patient will need day of surgery labs and evaluation.  EKG 10/25/21: This rhythm.  Rate 66.  Event monitor 08/04/2020: Impressions: Sinus rhythm on monitor. No ectopy documented. Normal cardiac monitor.   TTE 06/16/2020:  1. Global longitudinal strain is -23.2%.. Left ventricular ejection  fraction, by estimation, is 70 to 75%. The left ventricle has hyperdynamic  function. The left ventricle has no regional wall motion abnormalities.  Left ventricular diastolic parameters  were normal.   2. Right ventricular systolic function is normal. The right ventricular  size is normal.   3. The mitral valve is normal in structure. Trivial mitral valve  regurgitation.   4. The aortic valve is normal in structure. Aortic valve regurgitation is  not visualized.   5. The inferior vena cava is normal in size with greater than 50%  respiratory variability, suggesting right atrial pressure of 3 mmHg.      Wynonia Musty [facility] Short Stay Center/Anesthesiology Phone [phone] 03/02/2022 3:18 PM

## 2022-03-02 NOTE — Anesthesia Preprocedure Evaluation (Signed)
Anesthesia Evaluation  Patient identified by MRN, date of birth, ID band Patient awake    Reviewed: Allergy & Precautions, H&P , NPO status , Patient's Chart, lab work & pertinent test results  Airway Mallampati: II  TM Distance: >3 FB Neck ROM: Full    Dental no notable dental hx.    Pulmonary neg pulmonary ROS   Pulmonary exam normal breath sounds clear to auscultation       Cardiovascular negative cardio ROS Normal cardiovascular exam Rhythm:Regular Rate:Normal     Neuro/Psych negative neurological ROS  negative psych ROS   GI/Hepatic negative GI ROS, Neg liver ROS,,,  Endo/Other  diabetes    Renal/GU negative Renal ROS  negative genitourinary   Musculoskeletal negative musculoskeletal ROS (+)    Abdominal   Peds negative pediatric ROS (+)  Hematology negative hematology ROS (+)   Anesthesia Other Findings   Reproductive/Obstetrics negative OB ROS                             Anesthesia Physical Anesthesia Plan  ASA: 1  Anesthesia Plan: General   Post-op Pain Management: Regional block*   Induction: Intravenous  PONV Risk Score and Plan: 3 and Ondansetron, Dexamethasone and Treatment may vary due to age or medical condition  Airway Management Planned: LMA  Additional Equipment:   Intra-op Plan:   Post-operative Plan: Extubation in OR  Informed Consent: I have reviewed the patients History and Physical, chart, labs and discussed the procedure including the risks, benefits and alternatives for the proposed anesthesia with the patient or authorized representative who has indicated his/her understanding and acceptance.     Dental advisory given  Plan Discussed with: CRNA and Surgeon  Anesthesia Plan Comments: (PAT note by Karoline Caldwell, PA-C:  Long history of atypical chest pain and subjective palpitations with multiple benign evaluations.  She was seen by cardiology in  2022 and had echo and event monitor which were both completely benign.  She was recommended to reduce caffeine intake.  It was also discussed that she may benefit from counseling for possible panic attacks.  Patient will need day of surgery labs and evaluation.  EKG 10/25/21: This rhythm.  Rate 66.  Event monitor 08/04/2020: Impressions: Sinus rhythm on monitor. No ectopy documented. Normal cardiac monitor.   TTE 06/16/2020: 1. Global longitudinal strain is -23.2%.. Left ventricular ejection  fraction, by estimation, is 70 to 75%. The left ventricle has hyperdynamic  function. The left ventricle has no regional wall motion abnormalities.  Left ventricular diastolic parameters  were normal.  2. Right ventricular systolic function is normal. The right ventricular  size is normal.  3. The mitral valve is normal in structure. Trivial mitral valve  regurgitation.  4. The aortic valve is normal in structure. Aortic valve regurgitation is  not visualized.  5. The inferior vena cava is normal in size with greater than 50%  respiratory variability, suggesting right atrial pressure of 3 mmHg.   )        Anesthesia Quick Evaluation

## 2022-03-03 ENCOUNTER — Ambulatory Visit (HOSPITAL_COMMUNITY)
Admission: RE | Admit: 2022-03-03 | Discharge: 2022-03-03 | Disposition: A | Discharge status: Home / Self Care | Payer: Self-pay | Attending: Orthopedic Surgery | Admitting: Orthopedic Surgery

## 2022-03-03 ENCOUNTER — Ambulatory Visit (HOSPITAL_BASED_OUTPATIENT_CLINIC_OR_DEPARTMENT_OTHER): Payer: Self-pay | Admitting: Physician Assistant

## 2022-03-03 ENCOUNTER — Ambulatory Visit (HOSPITAL_COMMUNITY): Payer: Self-pay

## 2022-03-03 ENCOUNTER — Ambulatory Visit (HOSPITAL_COMMUNITY): Payer: Self-pay | Admitting: Physician Assistant

## 2022-03-03 ENCOUNTER — Encounter (HOSPITAL_COMMUNITY): Payer: Self-pay | Admitting: Orthopedic Surgery

## 2022-03-03 ENCOUNTER — Encounter (HOSPITAL_COMMUNITY)
Admission: RE | Disposition: A | Discharge status: Home / Self Care | Payer: Self-pay | Source: Home / Self Care | Attending: Orthopedic Surgery

## 2022-03-03 ENCOUNTER — Other Ambulatory Visit: Payer: Self-pay

## 2022-03-03 DIAGNOSIS — S82842A Displaced bimalleolar fracture of left lower leg, initial encounter for closed fracture: Secondary | ICD-10-CM | POA: Insufficient documentation

## 2022-03-03 DIAGNOSIS — S82842D Displaced bimalleolar fracture of left lower leg, subsequent encounter for closed fracture with routine healing: Secondary | ICD-10-CM

## 2022-03-03 DIAGNOSIS — W010XXA Fall on same level from slipping, tripping and stumbling without subsequent striking against object, initial encounter: Secondary | ICD-10-CM | POA: Insufficient documentation

## 2022-03-03 DIAGNOSIS — S93432A Sprain of tibiofibular ligament of left ankle, initial encounter: Secondary | ICD-10-CM | POA: Insufficient documentation

## 2022-03-03 DIAGNOSIS — S93432D Sprain of tibiofibular ligament of left ankle, subsequent encounter: Secondary | ICD-10-CM

## 2022-03-03 HISTORY — PX: ORIF ANKLE FRACTURE: SHX5408

## 2022-03-03 HISTORY — DX: Gastro-esophageal reflux disease without esophagitis: K21.9

## 2022-03-03 HISTORY — PX: SYNDESMOSIS REPAIR: SHX5182

## 2022-03-03 HISTORY — DX: Other fracture of unspecified lower leg, initial encounter for closed fracture: S82.899A

## 2022-03-03 LAB — CBC
HCT: 39.3 % (ref 36.0–46.0)
Hemoglobin: 13.5 g/dL (ref 12.0–15.0)
MCH: 30.8 pg (ref 26.0–34.0)
MCHC: 34.4 g/dL (ref 30.0–36.0)
MCV: 89.7 fL (ref 80.0–100.0)
Platelets: 338 10*3/uL (ref 150–400)
RBC: 4.38 MIL/uL (ref 3.87–5.11)
RDW: 12.8 % (ref 11.5–15.5)
WBC: 11.3 10*3/uL — ABNORMAL HIGH (ref 4.0–10.5)
nRBC: 0 % (ref 0.0–0.2)

## 2022-03-03 LAB — SURGICAL PCR SCREEN
MRSA, PCR: NEGATIVE
Staphylococcus aureus: NEGATIVE

## 2022-03-03 LAB — POCT PREGNANCY, URINE: Preg Test, Ur: NEGATIVE

## 2022-03-03 SURGERY — OPEN REDUCTION INTERNAL FIXATION (ORIF) ANKLE FRACTURE
Anesthesia: General | Site: Ankle | Laterality: Left

## 2022-03-03 MED ORDER — OXYCODONE HCL 5 MG PO TABS: 5.0000 mg | ORAL_TABLET | ORAL | 0 refills | Status: AC | PRN

## 2022-03-03 MED ORDER — HYDROMORPHONE HCL 1 MG/ML IJ SOLN: 0.2500 mg | INTRAMUSCULAR | Status: DC | PRN

## 2022-03-03 MED ORDER — DEXAMETHASONE SODIUM PHOSPHATE 10 MG/ML IJ SOLN
INTRAMUSCULAR | Status: DC | PRN
  Administered 2022-03-03: 5 mg via INTRAVENOUS

## 2022-03-03 MED ORDER — ONDANSETRON HCL 4 MG/2ML IJ SOLN: 4.0000 mg | Freq: Once | INTRAMUSCULAR | Status: DC | PRN

## 2022-03-03 MED ORDER — ACETAMINOPHEN 500 MG PO TABS
ORAL_TABLET | ORAL | Status: AC
  Administered 2022-03-03: 1000 mg via ORAL
  Filled 2022-03-03: qty 2

## 2022-03-03 MED ORDER — KETOROLAC TROMETHAMINE 30 MG/ML IJ SOLN
INTRAMUSCULAR | Status: AC
  Filled 2022-03-03: qty 1

## 2022-03-03 MED ORDER — PHENYLEPHRINE 80 MCG/ML (10ML) SYRINGE FOR IV PUSH (FOR BLOOD PRESSURE SUPPORT)
PREFILLED_SYRINGE | INTRAVENOUS | Status: DC | PRN
  Administered 2022-03-03: 160 ug via INTRAVENOUS

## 2022-03-03 MED ORDER — FENTANYL CITRATE (PF) 250 MCG/5ML IJ SOLN
INTRAMUSCULAR | Status: AC
  Filled 2022-03-03: qty 5

## 2022-03-03 MED ORDER — ONDANSETRON HCL 4 MG PO TABS: 4.0000 mg | ORAL_TABLET | Freq: Three times a day (TID) | ORAL | 0 refills | Status: AC | PRN

## 2022-03-03 MED ORDER — CHLORHEXIDINE GLUCONATE 0.12 % MT SOLN: 15.0000 mL | Freq: Once | OROMUCOSAL | Status: AC

## 2022-03-03 MED ORDER — OXYCODONE HCL 5 MG PO TABS
5.0000 mg | ORAL_TABLET | Freq: Once | ORAL | Status: AC | PRN
  Administered 2022-03-03: 5 mg via ORAL

## 2022-03-03 MED ORDER — POVIDONE-IODINE 7.5 % EX SOLN
Freq: Once | CUTANEOUS | Status: DC
  Filled 2022-03-03: qty 118

## 2022-03-03 MED ORDER — POVIDONE-IODINE 10 % EX SWAB
2.0000 | Freq: Once | CUTANEOUS | Status: AC
  Administered 2022-03-03: 2 via TOPICAL

## 2022-03-03 MED ORDER — CEFAZOLIN SODIUM-DEXTROSE 2-4 GM/100ML-% IV SOLN
INTRAVENOUS | Status: AC
  Filled 2022-03-03: qty 100

## 2022-03-03 MED ORDER — ONDANSETRON HCL 4 MG PO TABS: 4.0000 mg | ORAL_TABLET | Freq: Three times a day (TID) | ORAL | 0 refills | Status: DC | PRN

## 2022-03-03 MED ORDER — ONDANSETRON HCL 4 MG/2ML IJ SOLN
INTRAMUSCULAR | Status: DC | PRN
  Administered 2022-03-03: 4 mg via INTRAVENOUS

## 2022-03-03 MED ORDER — CHLORHEXIDINE GLUCONATE 0.12 % MT SOLN
OROMUCOSAL | Status: AC
  Administered 2022-03-03: 15 mL via OROMUCOSAL
  Filled 2022-03-03: qty 15

## 2022-03-03 MED ORDER — LACTATED RINGERS IV SOLN: INTRAVENOUS | Status: DC

## 2022-03-03 MED ORDER — OXYCODONE HCL 5 MG PO TABS
ORAL_TABLET | ORAL | Status: AC
  Filled 2022-03-03: qty 1

## 2022-03-03 MED ORDER — FENTANYL CITRATE (PF) 250 MCG/5ML IJ SOLN
INTRAMUSCULAR | Status: DC | PRN
  Administered 2022-03-03 (×2): 25 ug via INTRAVENOUS

## 2022-03-03 MED ORDER — PROPOFOL 500 MG/50ML IV EMUL
INTRAVENOUS | Status: DC | PRN
  Administered 2022-03-03: 125 ug/kg/min via INTRAVENOUS

## 2022-03-03 MED ORDER — MIDAZOLAM HCL 2 MG/2ML IJ SOLN
INTRAMUSCULAR | Status: AC
  Administered 2022-03-03: 2 mg via INTRAVENOUS
  Filled 2022-03-03: qty 2

## 2022-03-03 MED ORDER — CEFAZOLIN SODIUM-DEXTROSE 2-4 GM/100ML-% IV SOLN
2.0000 g | INTRAVENOUS | Status: AC
  Administered 2022-03-03: 2 g via INTRAVENOUS

## 2022-03-03 MED ORDER — PHENYLEPHRINE HCL-NACL 20-0.9 MG/250ML-% IV SOLN
INTRAVENOUS | Status: DC | PRN
  Administered 2022-03-03: 40 ug/min via INTRAVENOUS

## 2022-03-03 MED ORDER — BUPIVACAINE HCL (PF) 0.5 % IJ SOLN
INTRAMUSCULAR | Status: DC | PRN
  Administered 2022-03-03: 30 mL via PERINEURAL

## 2022-03-03 MED ORDER — ACETAMINOPHEN 500 MG PO TABS: 1000.0000 mg | ORAL_TABLET | Freq: Once | ORAL | Status: AC

## 2022-03-03 MED ORDER — ORAL CARE MOUTH RINSE: 15.0000 mL | Freq: Once | OROMUCOSAL | Status: AC

## 2022-03-03 MED ORDER — LIDOCAINE-EPINEPHRINE (PF) 1.5 %-1:200000 IJ SOLN
INTRAMUSCULAR | Status: DC | PRN
  Administered 2022-03-03: 10 mL via PERINEURAL

## 2022-03-03 MED ORDER — OXYCODONE HCL 5 MG/5ML PO SOLN: 5.0000 mg | Freq: Once | ORAL | Status: AC | PRN

## 2022-03-03 MED ORDER — FENTANYL CITRATE (PF) 100 MCG/2ML IJ SOLN: 100.0000 ug | Freq: Once | INTRAMUSCULAR | Status: AC

## 2022-03-03 MED ORDER — OXYCODONE HCL 5 MG PO TABS: 5.0000 mg | ORAL_TABLET | ORAL | 0 refills | Status: DC | PRN

## 2022-03-03 MED ORDER — PROPOFOL 10 MG/ML IV BOLUS
INTRAVENOUS | Status: AC
  Filled 2022-03-03: qty 20

## 2022-03-03 MED ORDER — 0.9 % SODIUM CHLORIDE (POUR BTL) OPTIME
TOPICAL | Status: DC | PRN
  Administered 2022-03-03: 1000 mL

## 2022-03-03 MED ORDER — MIDAZOLAM HCL 2 MG/2ML IJ SOLN: 2.0000 mg | Freq: Once | INTRAMUSCULAR | Status: AC

## 2022-03-03 MED ORDER — MIDAZOLAM HCL 2 MG/2ML IJ SOLN
INTRAMUSCULAR | Status: DC | PRN
  Administered 2022-03-03: 2 mg via INTRAVENOUS

## 2022-03-03 MED ORDER — PROPOFOL 10 MG/ML IV BOLUS
INTRAVENOUS | Status: DC | PRN
  Administered 2022-03-03: 200 mg via INTRAVENOUS

## 2022-03-03 MED ORDER — FENTANYL CITRATE (PF) 100 MCG/2ML IJ SOLN
INTRAMUSCULAR | Status: AC
  Administered 2022-03-03: 100 ug via INTRAVENOUS
  Filled 2022-03-03: qty 2

## 2022-03-03 MED ORDER — MIDAZOLAM HCL 2 MG/2ML IJ SOLN
INTRAMUSCULAR | Status: AC
  Filled 2022-03-03: qty 2

## 2022-03-03 MED ORDER — KETOROLAC TROMETHAMINE 30 MG/ML IJ SOLN
30.0000 mg | Freq: Once | INTRAMUSCULAR | Status: AC | PRN
  Administered 2022-03-03: 30 mg via INTRAVENOUS

## 2022-03-03 SURGICAL SUPPLY — 63 items
ANKLE SYNDEMOSIS ZIPTIGHT (Ankle) ×2 IMPLANT
BAG COUNTER SPONGE SURGICOUNT (BAG) IMPLANT
BANDAGE ESMARK 6X9 LF (GAUZE/BANDAGES/DRESSINGS) IMPLANT
BIT DRILL 110X2.5XQCK CNCT (BIT) IMPLANT
BIT DRILL 2.5 (BIT) ×2
BIT DRL 110X2.5XQCK CNCT (BIT) ×2
BNDG ELASTIC 4X5.8 VLCR STR LF (GAUZE/BANDAGES/DRESSINGS) ×2 IMPLANT
BNDG ELASTIC 6X5.8 VLCR STR LF (GAUZE/BANDAGES/DRESSINGS) ×2 IMPLANT
BNDG ESMARK 6X9 LF (GAUZE/BANDAGES/DRESSINGS)
BOOTCOVER CLEANROOM LRG (PROTECTIVE WEAR) IMPLANT
CANISTER SUCT 3000ML PPV (MISCELLANEOUS) ×2 IMPLANT
COVER SURGICAL LIGHT HANDLE (MISCELLANEOUS) ×2 IMPLANT
CUFF TOURN SGL QUICK 34 (TOURNIQUET CUFF) ×2
CUFF TRNQT CYL 34X4.125X (TOURNIQUET CUFF) IMPLANT
DRAPE INCISE IOBAN 66X45 STRL (DRAPES) IMPLANT
DRAPE OEC MINIVIEW 54X84 (DRAPES) ×2 IMPLANT
DRAPE U-SHAPE 47X51 STRL (DRAPES) IMPLANT
DURAPREP 26ML APPLICATOR (WOUND CARE) ×2 IMPLANT
ELECT REM PT RETURN 9FT ADLT (ELECTROSURGICAL) ×2
ELECTRODE REM PT RTRN 9FT ADLT (ELECTROSURGICAL) ×2 IMPLANT
GAUZE PAD ABD 8X10 STRL (GAUZE/BANDAGES/DRESSINGS) IMPLANT
GAUZE SPONGE 4X4 12PLY STRL (GAUZE/BANDAGES/DRESSINGS) IMPLANT
GLOVE BIO SURGEON STRL SZ7 (GLOVE) ×4 IMPLANT
GLOVE BIOGEL PI IND STRL 7.0 (GLOVE) ×2 IMPLANT
GLOVE ORTHO TXT STRL SZ7.5 (GLOVE) ×2 IMPLANT
GOWN STRL REUS W/ TWL LRG LVL3 (GOWN DISPOSABLE) ×2 IMPLANT
GOWN STRL REUS W/ TWL XL LVL3 (GOWN DISPOSABLE) ×2 IMPLANT
GOWN STRL REUS W/TWL 2XL LVL3 (GOWN DISPOSABLE) ×2 IMPLANT
GOWN STRL REUS W/TWL LRG LVL3 (GOWN DISPOSABLE) ×2
GOWN STRL REUS W/TWL XL LVL3 (GOWN DISPOSABLE) ×2
KIT BASIN OR (CUSTOM PROCEDURE TRAY) ×2 IMPLANT
KIT TURNOVER KIT B (KITS) ×2 IMPLANT
MANIFOLD NEPTUNE II (INSTRUMENTS) ×2 IMPLANT
NEEDLE HYPO 22GX1.5 SAFETY (NEEDLE) IMPLANT
NS IRRIG 1000ML POUR BTL (IV SOLUTION) ×2 IMPLANT
PACK ORTHO EXTREMITY (CUSTOM PROCEDURE TRAY) ×2 IMPLANT
PAD ARMBOARD 7.5X6 YLW CONV (MISCELLANEOUS) ×4 IMPLANT
PAD CAST 4YDX4 CTTN HI CHSV (CAST SUPPLIES) IMPLANT
PADDING CAST COTTON 4X4 STRL (CAST SUPPLIES) ×2
PADDING CAST COTTON 6X4 STRL (CAST SUPPLIES) IMPLANT
PLATE 6HOLE 1/3 TUBULAR (Plate) IMPLANT
SCREW CANC 4X16 (Screw) IMPLANT
SCREW CANCELLOUS 4.0X18MM (Screw) IMPLANT
SCREW CORT 2.5X20X3.5XST SM (Screw) IMPLANT
SCREW CORTICAL 3.5X12 (Screw) IMPLANT
SCREW CORTICAL 3.5X20 (Screw) ×2 IMPLANT
SPLINT PLASTER CAST FAST 5X30 (CAST SUPPLIES) IMPLANT
SPONGE T-LAP 4X18 ~~LOC~~+RFID (SPONGE) ×2 IMPLANT
STRIP CLOSURE SKIN 1/2X4 (GAUZE/BANDAGES/DRESSINGS) IMPLANT
SUCTION FRAZIER HANDLE 10FR (MISCELLANEOUS) ×2
SUCTION TUBE FRAZIER 10FR DISP (MISCELLANEOUS) ×2 IMPLANT
SUT MNCRL AB 3-0 PS2 18 (SUTURE) ×2 IMPLANT
SUT VIC AB 0 CT1 27 (SUTURE) ×4
SUT VIC AB 0 CT1 27XBRD ANBCTR (SUTURE) ×2 IMPLANT
SUT VIC AB 3-0 SH 8-18 (SUTURE) ×2 IMPLANT
SYR CONTROL 10ML LL (SYRINGE) IMPLANT
SYSTEM FIXATN ANKL SYNDESMOSIS (Ankle) IMPLANT
TOWEL GREEN STERILE (TOWEL DISPOSABLE) ×2 IMPLANT
TOWEL GREEN STERILE FF (TOWEL DISPOSABLE) ×2 IMPLANT
TUBE CONNECTING 12X1/4 (SUCTIONS) ×2 IMPLANT
UNDERPAD 30X36 HEAVY ABSORB (UNDERPADS AND DIAPERS) ×2 IMPLANT
WATER STERILE IRR 1000ML POUR (IV SOLUTION) IMPLANT
YANKAUER SUCT BULB TIP NO VENT (SUCTIONS) IMPLANT

## 2022-03-03 NOTE — Anesthesia Procedure Notes (Signed)
Procedure Name: LMA Insertion Date/Time: 03/03/2022 5:19 PM  Performed by: Elvin So, CRNAPre-anesthesia Checklist: Patient identified, Emergency Drugs available, Suction available and Patient being monitored Patient Re-evaluated:Patient Re-evaluated prior to induction Oxygen Delivery Method: Circle System Utilized Preoxygenation: Pre-oxygenation with 100% oxygen Induction Type: IV induction Ventilation: Mask ventilation without difficulty LMA: LMA inserted LMA Size: 4.0 Number of attempts: 1 Airway Equipment and Method: Bite block Placement Confirmation: positive ETCO2 Tube secured with: Tape Dental Injury: Teeth and Oropharynx as per pre-operative assessment

## 2022-03-03 NOTE — Interval H&P Note (Signed)
History and Physical Interval Note:  03/03/2022 4:08 PM  Joanna Whitney  has presented today for surgery, with the diagnosis of left ankle fracture.  The various methods of treatment have been discussed with the patient and family. After consideration of risks, benefits and other options for treatment, the patient has consented to  Procedure(s): OPEN REDUCTION INTERNAL FIXATION (ORIF) ANKLE FRACTURE (Left) SYNDESMOSIS REPAIR (Left) as a surgical intervention.  The patient's history has been reviewed, patient examined, no change in status, stable for surgery.  I have reviewed the patient's chart and labs.  Questions were answered to the patient's satisfaction.     Johnny Bridge

## 2022-03-03 NOTE — Op Note (Signed)
03/03/2022  PATIENT:  Joanna Whitney    PRE-OPERATIVE DIAGNOSIS: Left bimalleolar ankle fracture with syndesmotic disruption, history of fracture dislocation  POST-OPERATIVE DIAGNOSIS:  Same  PROCEDURE:    1.  Open Reduction Internal Fixation left fibula, without fixation of the posterior lip with open reduction internal fixation left syndesmosis 2.  3 views plus a stress view of the left ankle  SURGEON:  Johnny Bridge, MD  PHYSICIAN ASSISTANT: Merlene Pulling, PA-C, present and scrubbed throughout the case, critical for completion in a timely fashion, and for retraction, instrumentation, and closure.         ANESTHESIA:   General  ESTIMATED BLOOD LOSS: 50 mL  UNIQUE ASPECTS OF THE CASE: Bone quality was reasonably good.  The fracture reduced very nicely, and was held anatomically with an interfragmentary screw.  The posterior malleolus piece was fairly small, and was really not amenable to fixation, less than 15% of the articular surface.  The syndesmotic fixation provided augmentation of the posterior stability.  PREOPERATIVE INDICATIONS:  Sharda Keddy is a  42 y.o. female with a diagnosis of left ankle fracture with dislocation who was reduced in the emergency room and referred to my office who elected for surgical management to minimize the risk for malunion and nonunion and post-traumatic arthritis.    The risks benefits and alternatives were discussed with the patient preoperatively including but not limited to the risks of infection, bleeding, nerve injury, cardiopulmonary complications, the need for revision surgery, the need for hardware removal, among others, and the patient was willing to proceed.  OPERATIVE IMPLANTS: Biomet / Zimmer 1/3 tubular plate, with a single interfragmentary screw.  I used a stainless steel zip tight for the syndesmosis.  OPERATIVE PROCEDURE: The patient was brought to the operating room and placed in the supine position. All bony  prominences were padded. General anesthesia was administered. The lower extremity was prepped and draped in the usual sterile fashion.  Tourniquet was not utilized.  Time out was performed.   Incision was made over the distal fibula and the fracture was exposed and reduced anatomically with a clamp. A interfragmentary screw was placed. I then applied a 1/3 tubular plate and secured it proximally and distally non-locking screws. Bone quality was mediocre. I used c-arm to confirm satisfactory reduction and fixation.   The syndesmosis was stressed using live fluoroscopy and found to be slightly unstable, and I felt that the posterior syndesmotic ligaments were disrupted based on the bony avulsed piece from the posterior lateral tibia.  Therefore I placed a guidewire across the tibia and fibula, and then drilled over the guidewire, and passed the button, and provided finger tension augmentation of syndesmotic fixation.  The wounds were irrigated, and closed with vicryl with routine closure for the skin. The wounds were injected with local anesthetic. Sterile gauze was applied followed by a posterior splint. She was awakened and returned to the PACU in stable and satisfactory condition. There were no complications.  3-Views plus a stress view of the ankle demonstrated appropriate reconstruction of the mortise with internal fixation and appropriate stability of the syndesmosis.

## 2022-03-03 NOTE — Discharge Instructions (Signed)
Diet: As you were doing prior to hospitalization   Shower:  May shower but keep the wounds dry, use an occlusive plastic wrap, NO SOAKING IN TUB.  If the bandage gets wet, change with a clean dry gauze.  If you have a splint on, leave the splint in place and keep the splint dry with a plastic bag.  Dressing:  You may change your dressing 3-5 days after surgery, unless you have a splint.  If you have a splint, then just leave the splint in place and we will change your bandages during your first follow-up appointment.    If you had hand or foot surgery, we will plan to remove your stitches in about 2 weeks in the office.  For all other surgeries, there are sticky tapes (steri-strips) on your wounds and all the stitches are absorbable.  Leave the steri-strips in place when changing your dressings, they will peel off with time, usually 2-3 weeks.  Activity:  Increase activity slowly as tolerated, but follow the weight bearing instructions below.  The rules on driving is that you can not be taking narcotics while you drive, and you must feel in control of the vehicle.    Weight Bearing:   no bearing weight on left leg  To prevent constipation: you may use a stool softener such as -  Colace (over the counter) 100 mg by mouth twice a day  Drink plenty of fluids (prune juice may be helpful) and high fiber foods Miralax (over the counter) for constipation as needed.    Itching:  If you experience itching with your medications, try taking only a single pain pill, or even half a pain pill at a time.  You may take up to 10 pain pills per day, and you can also use benadryl over the counter for itching or also to help with sleep.   Precautions:  If you experience chest pain or shortness of breath - call 911 immediately for transfer to the hospital emergency department!!  If you develop a fever greater that 101 F, purulent drainage from wound, increased redness or drainage from wound, or calf pain -- Call  the office at [phone]                                                Follow- Up Appointment:  Please call for an appointment to be seen in 2 weeks [place] - [phone]

## 2022-03-03 NOTE — Anesthesia Procedure Notes (Signed)
Anesthesia Regional Block: Adductor canal block (saphenous nerve)   Pre-Anesthetic Checklist: , timeout performed,  Correct Patient, Correct Site, Correct Laterality,  Correct Procedure, Correct Position, site marked,  Risks and benefits discussed,  Surgical consent,  Pre-op evaluation,  At surgeon's request and post-op pain management  Laterality: Left  Prep: chloraprep       Needles:  Injection technique: Single-shot  Needle Type: Echogenic Needle     Needle Length: 5cm  Needle Gauge: 22     Additional Needles:   Procedures:,,,, ultrasound used (permanent image in chart),,    Narrative:  Start time: 03/03/2022 3:45 PM End time: 03/03/2022 3:50 PM Injection made incrementally with aspirations every 5 mL.  Performed by: Personally  Anesthesiologist: Myrtie Soman, MD  Additional Notes: Patient tolerated the procedure well without complications

## 2022-03-03 NOTE — Anesthesia Postprocedure Evaluation (Signed)
Anesthesia Post Note  Patient: Lindie Spruce  Procedure(s) Performed: OPEN REDUCTION INTERNAL FIXATION (ORIF) ANKLE FRACTURE (Left: Ankle) SYNDESMOSIS REPAIR (Left)     Patient location during evaluation: PACU Anesthesia Type: General Level of consciousness: awake and alert Pain management: pain level controlled Vital Signs Assessment: post-procedure vital signs reviewed and stable Respiratory status: spontaneous breathing, nonlabored ventilation, respiratory function stable and patient connected to nasal cannula oxygen Cardiovascular status: blood pressure returned to baseline and stable Postop Assessment: no apparent nausea or vomiting Anesthetic complications: no  No notable events documented.  Last Vitals:  Vitals:   03/03/22 1900 03/03/22 1915  BP: 92/81 108/78  Pulse: 89 81  Resp: 19 15  Temp:  36.6 C  SpO2: 100% 99%    Last Pain:  Vitals:   03/03/22 1845  PainSc: [place] Kiona Blume

## 2022-03-03 NOTE — Transfer of Care (Signed)
Immediate Anesthesia Transfer of Care Note  Patient: Joanna Whitney  Procedure(s) Performed: OPEN REDUCTION INTERNAL FIXATION (ORIF) ANKLE FRACTURE (Left: Ankle) SYNDESMOSIS REPAIR (Left)  Patient Location: PACU  Anesthesia Type:General  Level of Consciousness: awake and patient cooperative  Airway & Oxygen Therapy: Patient Spontanous Breathing and Patient connected to face mask oxygen  Post-op Assessment: Report given to RN, Post -op Vital signs reviewed and stable, and Patient moving all extremities  Post vital signs: Reviewed and stable  Last Vitals:  Vitals Value Taken Time  BP 93/58 03/03/22 1845  Temp    Pulse 78 03/03/22 1847  Resp 12 03/03/22 1847  SpO2 99 % 03/03/22 1847  Vitals shown include unvalidated device data.  Last Pain:  Vitals:   03/03/22 1845  PainSc: Asleep      Patients Stated Pain Goal: 2 (43/56/86 1683)  Complications: No notable events documented.

## 2022-03-03 NOTE — Anesthesia Procedure Notes (Signed)
Anesthesia Procedure Image    

## 2022-03-03 NOTE — Anesthesia Procedure Notes (Signed)
Anesthesia Regional Block: Popliteal block   Pre-Anesthetic Checklist: , timeout performed,  Correct Patient, Correct Site, Correct Laterality,  Correct Procedure, Correct Position, site marked,  Risks and benefits discussed,  Surgical consent,  Pre-op evaluation,  At surgeon's request and post-op pain management  Laterality: Left  Prep: chloraprep       Needles:  Injection technique: Single-shot  Needle Type: Echogenic Needle     Needle Length: 9cm      Additional Needles:   Procedures:,,,, ultrasound used (permanent image in chart),,    Narrative:  Start time: 03/03/2022 3:34 PM End time: 03/03/2022 3:43 PM Injection made incrementally with aspirations every 5 mL.  Performed by: Personally  Anesthesiologist: Myrtie Soman, MD  Additional Notes: Patient tolerated the procedure well without complications

## 2022-03-04 ENCOUNTER — Encounter (HOSPITAL_COMMUNITY): Payer: Self-pay | Admitting: Orthopedic Surgery

## 2022-04-12 IMAGING — US US ABDOMEN COMPLETE
1 series · 14 of 25 positions shown · non-contrast
Comparison: None.

CLINICAL DATA: Bloating.

EXAM:
ABDOMEN ULTRASOUND COMPLETE

[Series 1: us abdomen complete · 0.23mm/px · 14 of 94 slices shown]
[im 1/94]
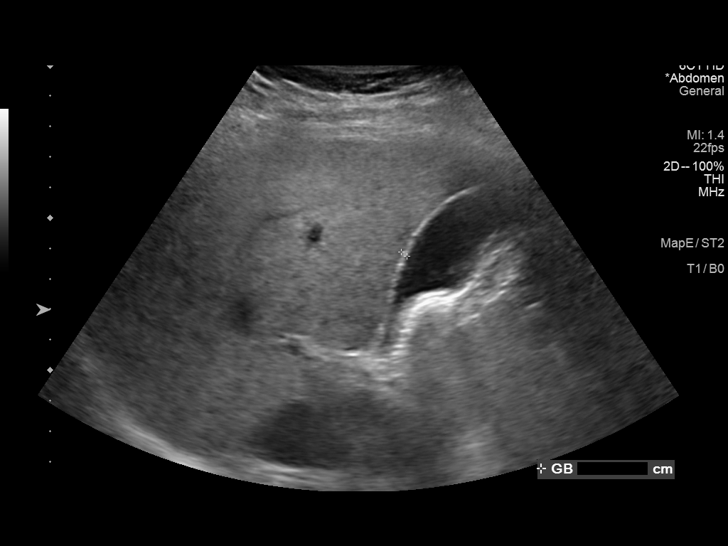
[im 8/94]
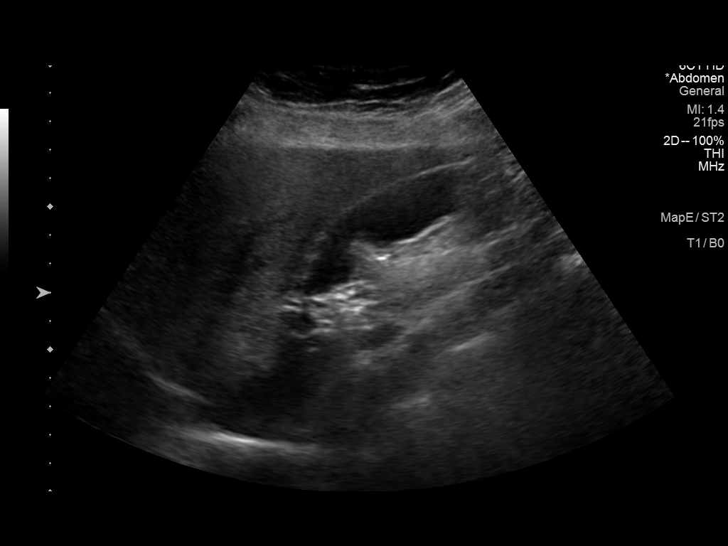
[im 16/94]
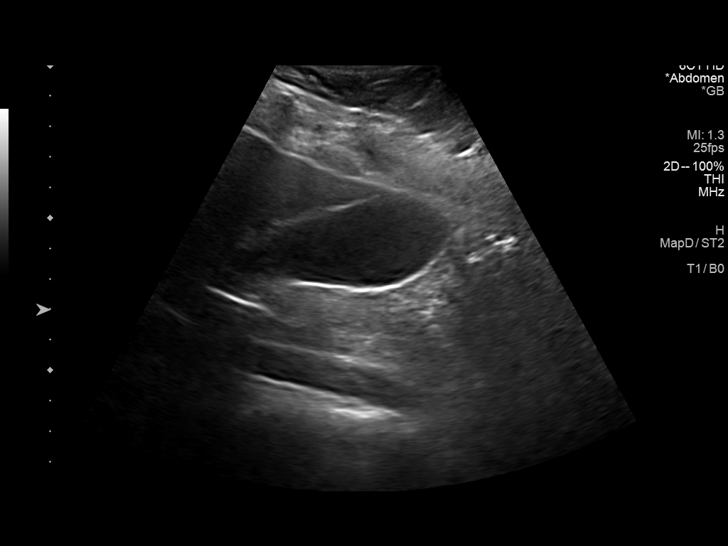
[im 24/94]
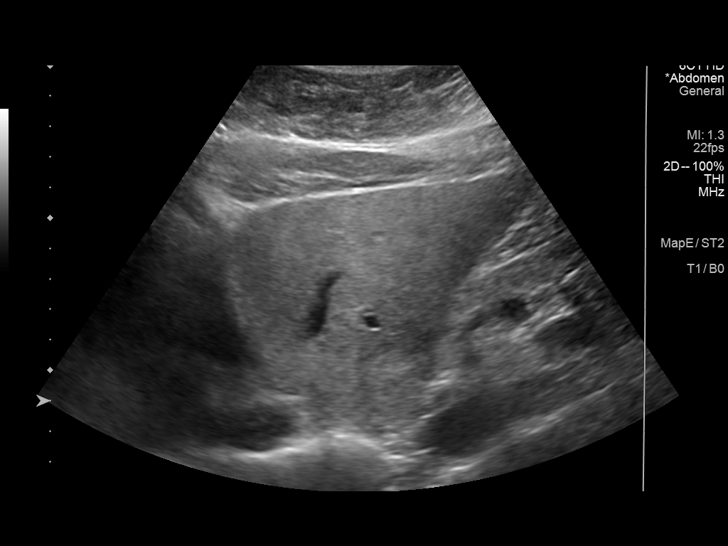
[im 32/94]
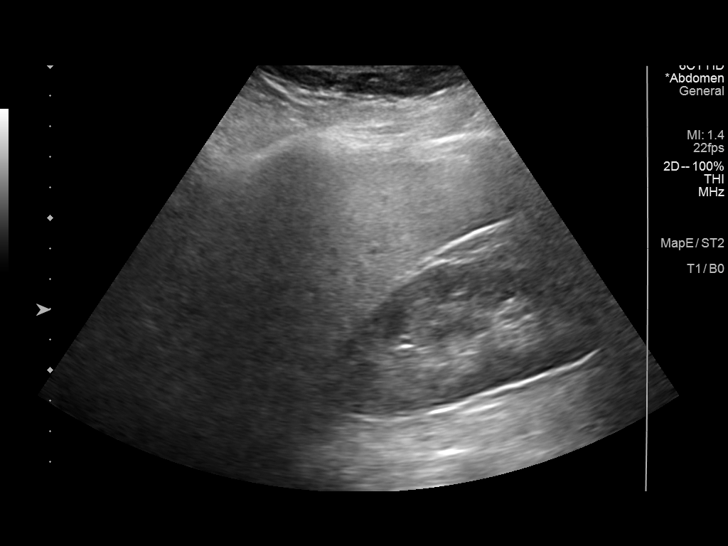
[im 35/94]
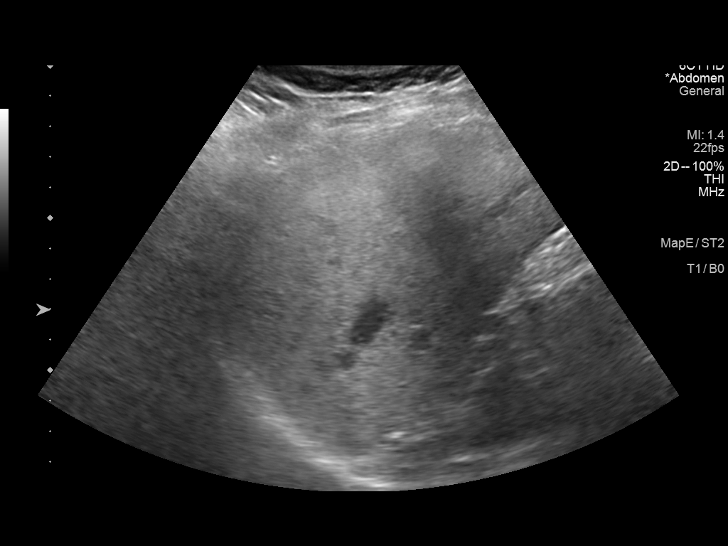
[im 43/94]
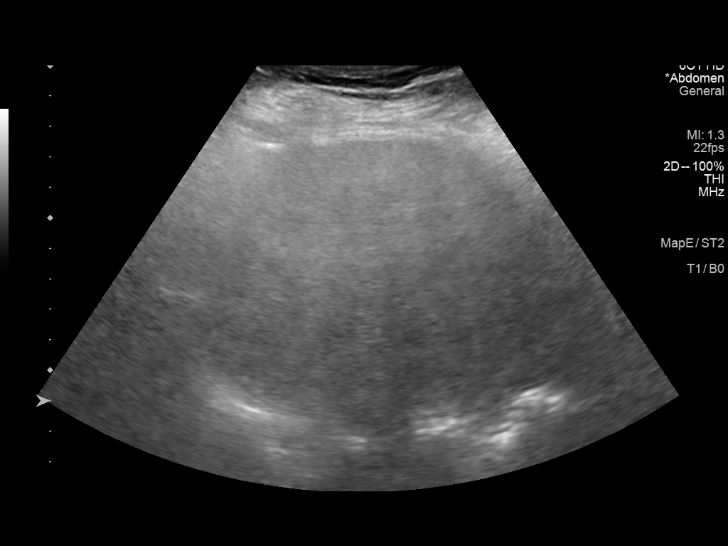
[im 51/94]
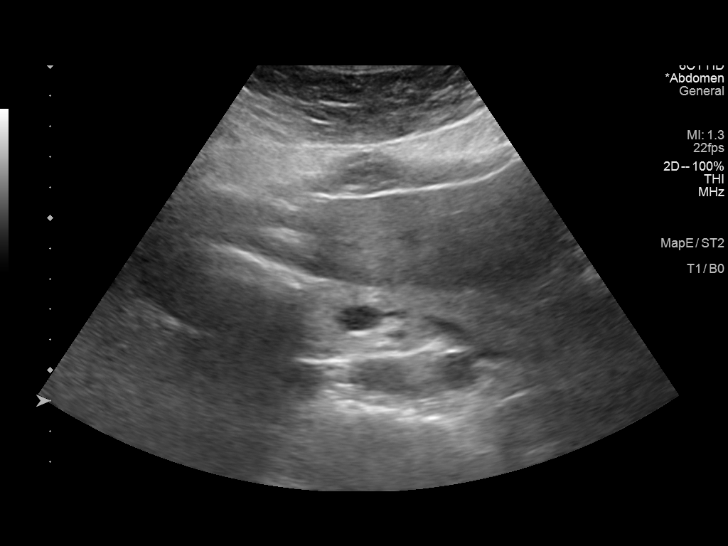
[im 59/94]
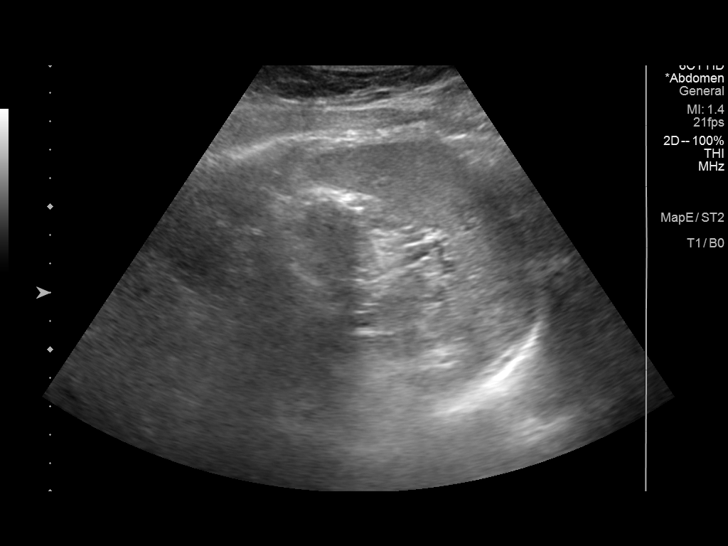
[im 63/94]
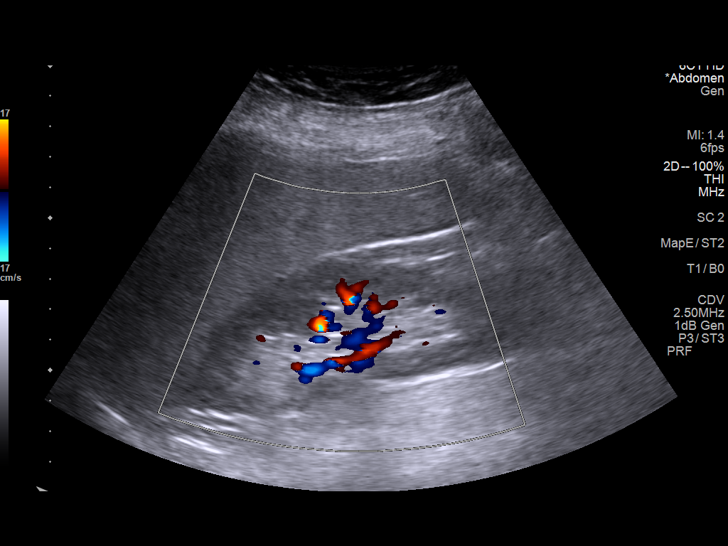
[im 70/94]
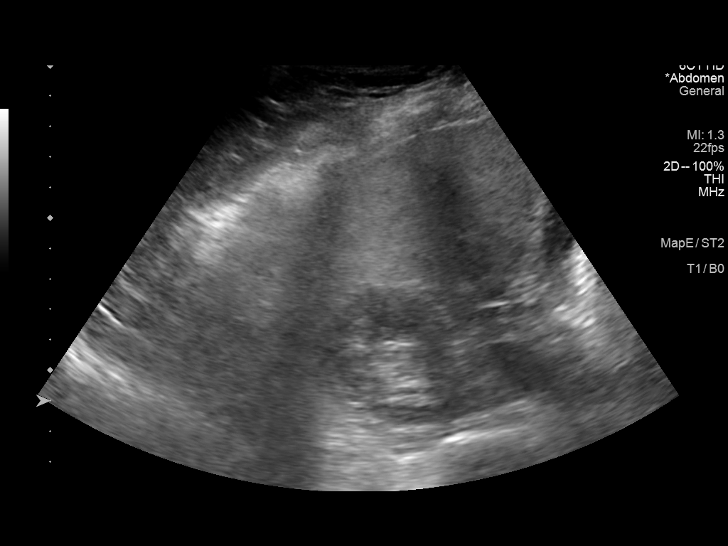
[im 78/94]
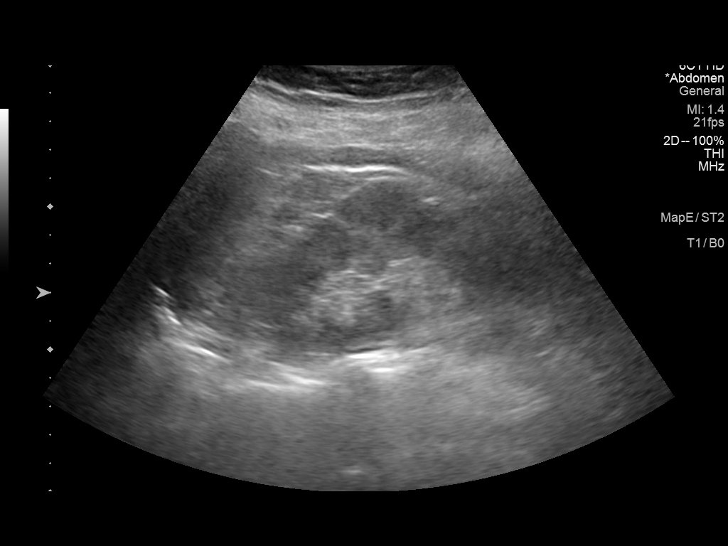
[im 86/94]
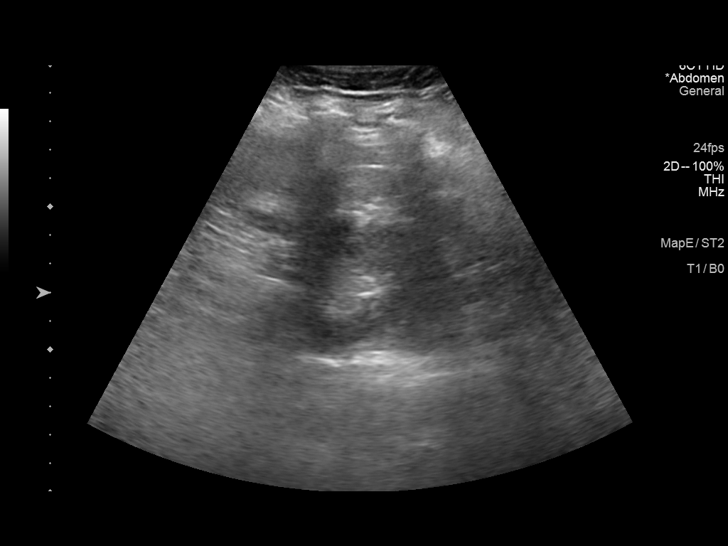
[im 94/94]
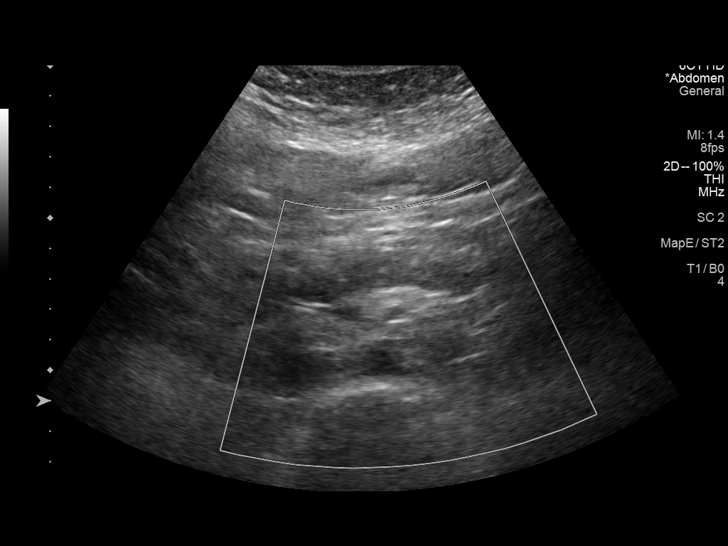

[14 of 25 positions shown; findings below may reference images not displayed]

FINDINGS: Gallbladder: No gallstones or wall thickening visualized. No
sonographic Murphy sign noted by sonographer.

Common bile duct: Diameter: 3.7 mm

Liver: No focal lesion identified. Increased parenchymal
echogenicity. Portal vein is patent on color Doppler imaging with
normal direction of blood flow towards the liver.

IVC: No abnormality visualized.

Pancreas: Visualized portion unremarkable.

Spleen: Size and appearance within normal limits.

Right Kidney: Length: 11 cm. Echogenicity within normal limits. No
mass or hydronephrosis visualized.

Left Kidney: Length: 10.9 cm. Echogenicity within normal limits. No
mass or hydronephrosis visualized.

Abdominal aorta: No aneurysm visualized.

Other findings: None.
IMPRESSION: Hepatic steatosis. Please note limited evaluation for focal hepatic
masses in a patient with hepatic steatosis due to decreased
penetration of the acoustic ultrasound waves.

## 2022-04-13 IMAGING — US US PELVIS COMPLETE WITH TRANSVAGINAL
1 series · 14 of 25 positions shown · non-contrast
Comparison: None

CLINICAL DATA: Bloating and pelvic pain.

EXAM:
TRANSABDOMINAL AND TRANSVAGINAL ULTRASOUND OF PELVIS
TECHNIQUE: Both transabdominal and transvaginal ultrasound examinations of the
pelvis were performed. Transabdominal technique was performed for
global imaging of the pelvis including uterus, ovaries, adnexal
regions, and pelvic cul-de-sac. It was necessary to proceed with
endovaginal exam following the transabdominal exam to visualize the
ovaries and endometrium.

[Series 1: us pelvis complete with transvaginal · 0.28mm/px · 14 of 57 slices shown]
[im 1/57]
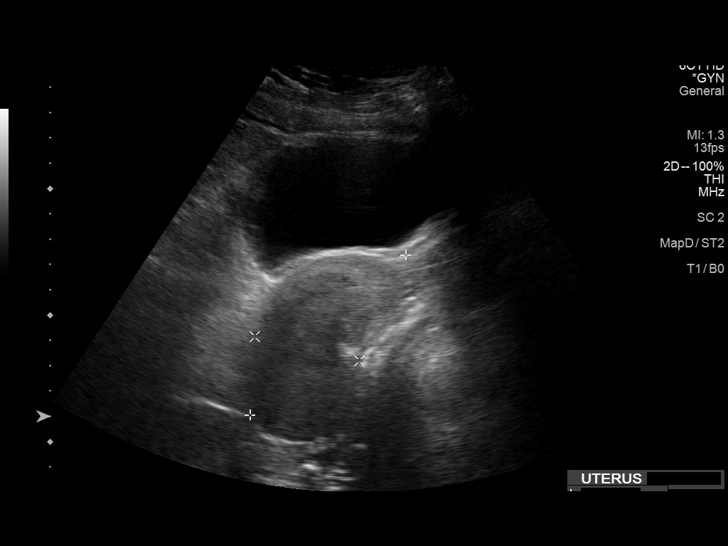
[im 5/57]
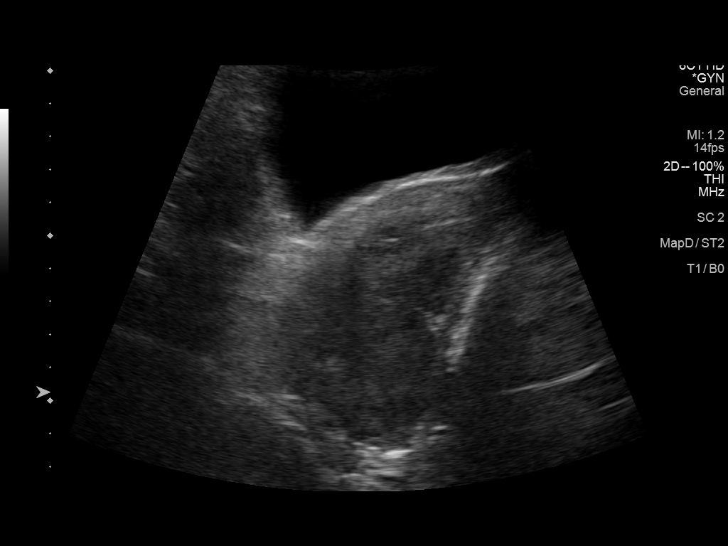
[im 10/57]
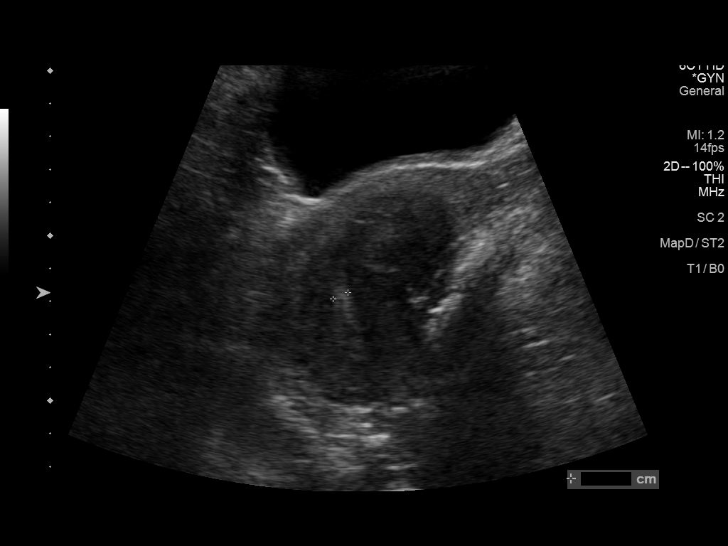
[im 15/57]
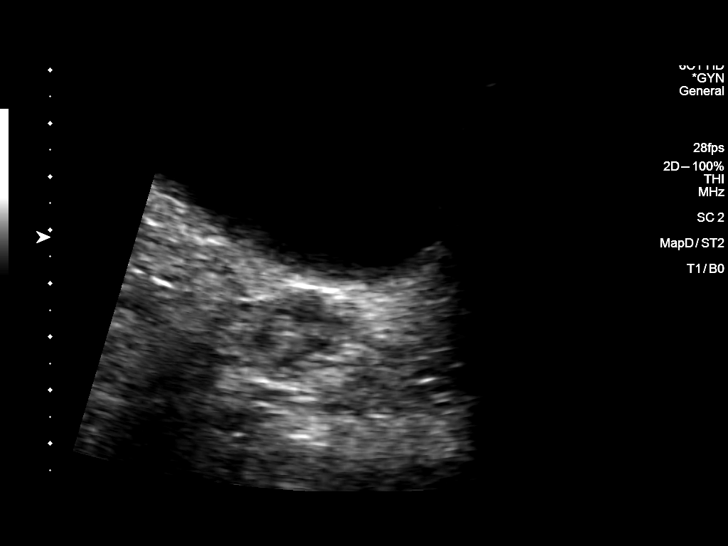
[im 19/57]
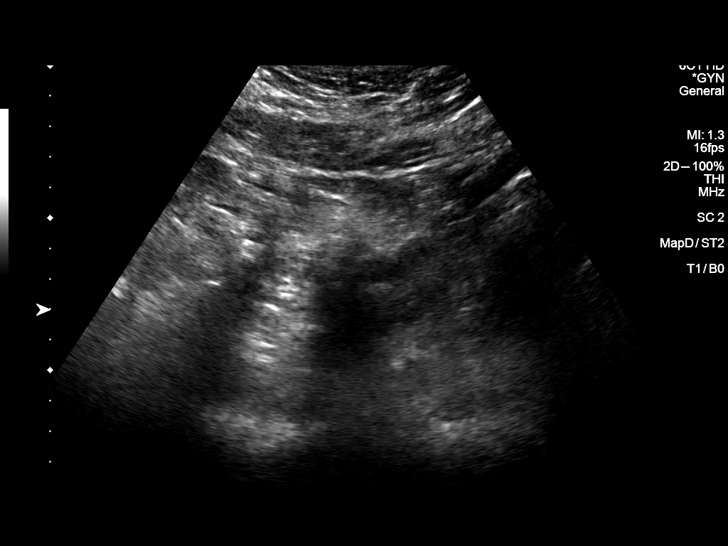
[im 22/57]
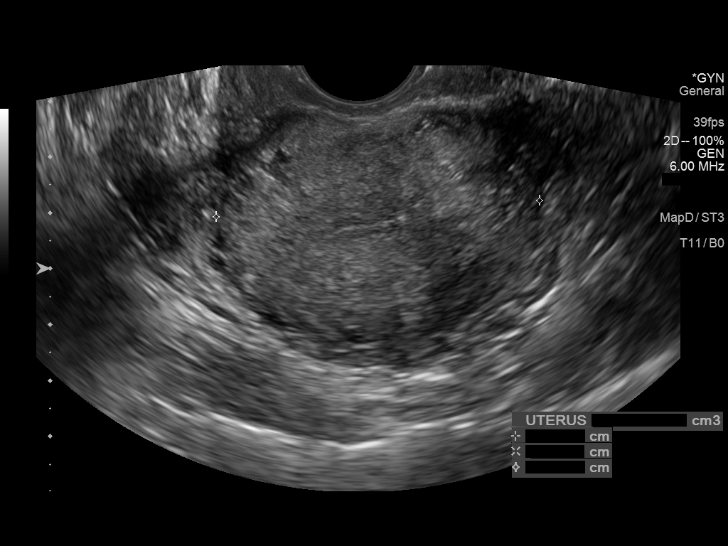
[im 26/57]
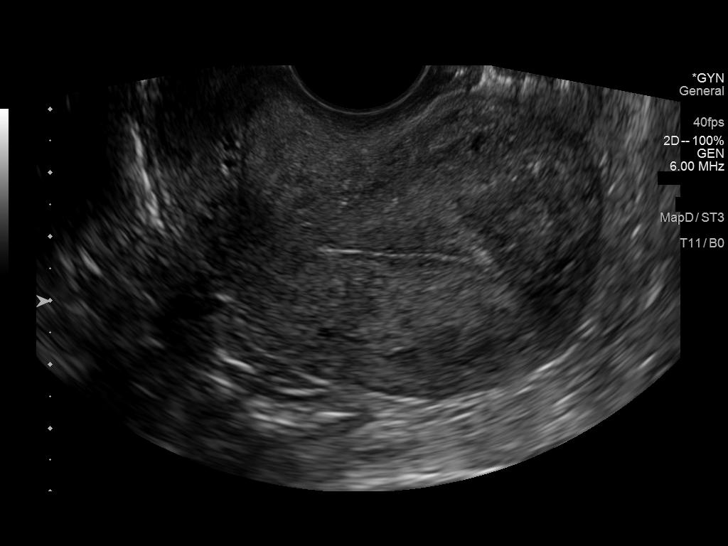
[im 31/57]
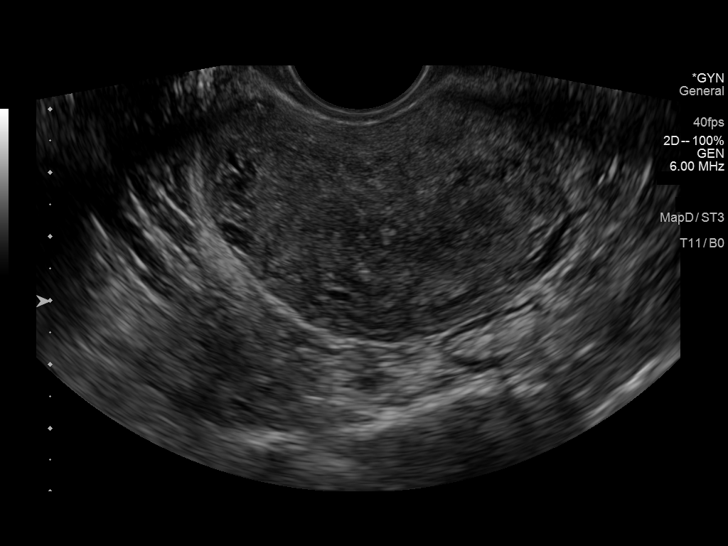
[im 36/57]
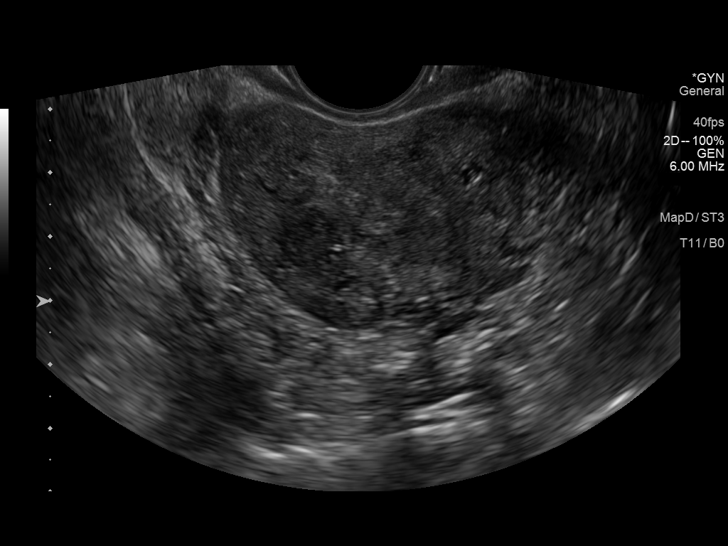
[im 38/57]
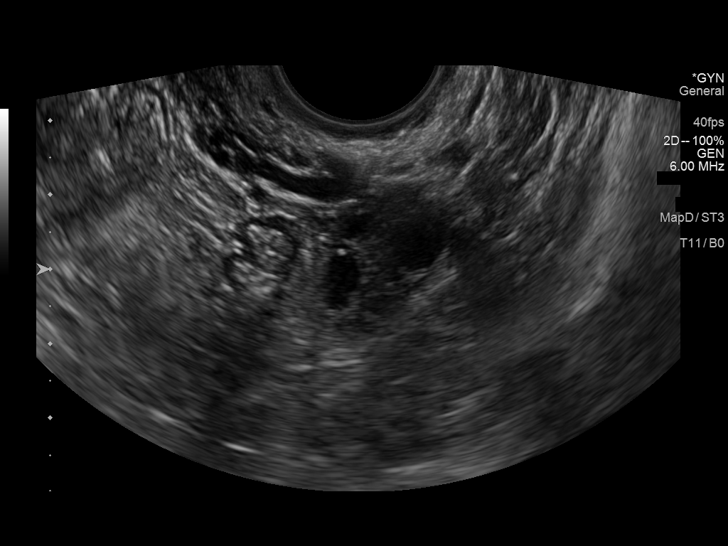
[im 43/57]
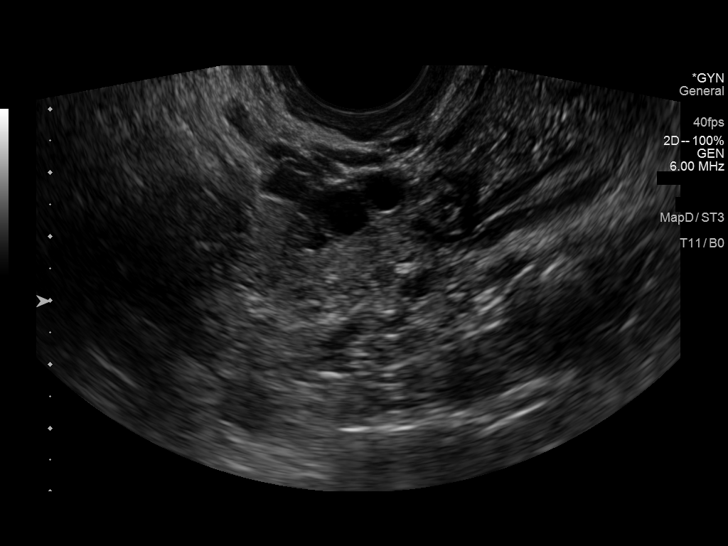
[im 47/57]
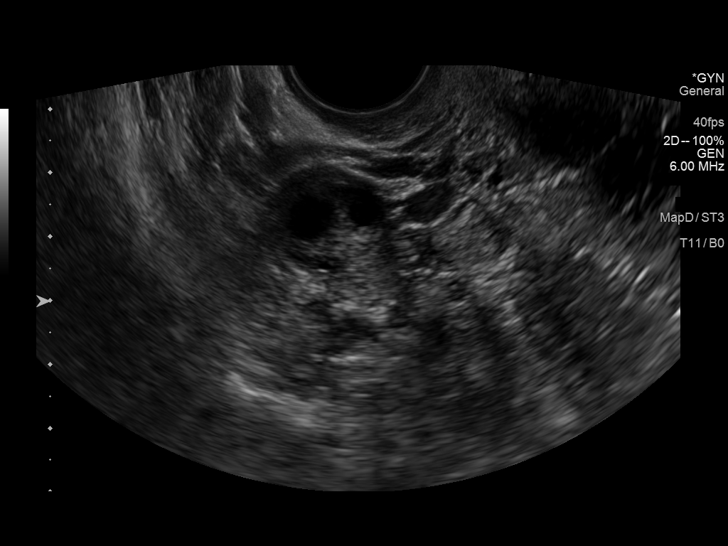
[im 52/57]
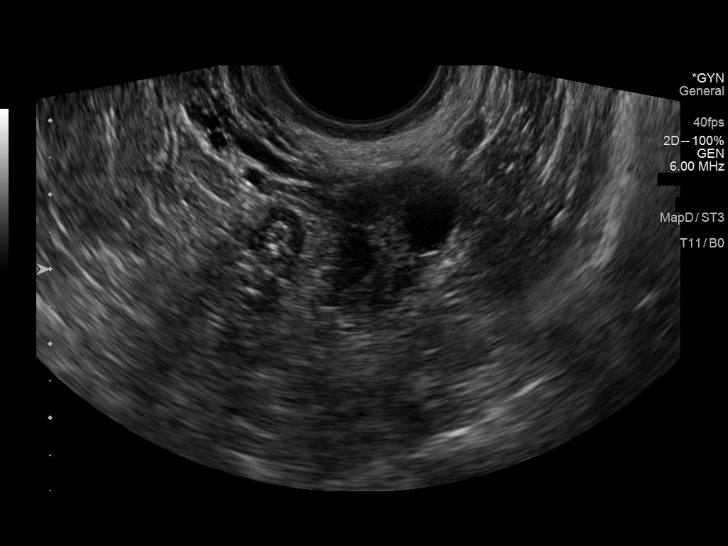
[im 57/57]
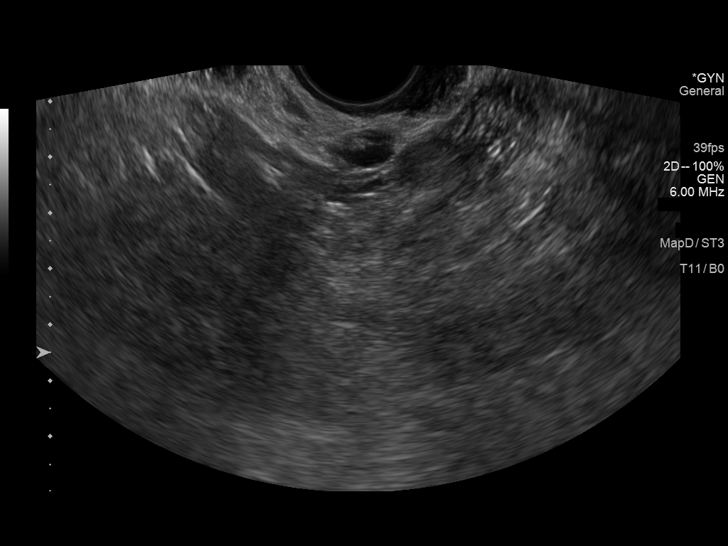

[14 of 25 positions shown; findings below may reference images not displayed]

FINDINGS: Uterus

Measurements: 5.7 x 7.8 x 5.8 cm = volume: 137 mL. No fibroids or
other mass visualized. Diffusely heterogeneous myometrium.

Endometrium

Thickness: 4.4 mm.  No focal abnormality visualized.

Right ovary

Measurements: 2.7 x 1.8 x 2.6 cm = volume: 6.4 mL. Normal
appearance/no adnexal mass.

Left ovary

Measurements: 2.6 x 1.5 x 2.8 cm = volume: 5.5 mL. Normal
appearance/no adnexal mass.

Other findings

No abnormal free fluid.
IMPRESSION: 1. Diffusely heterogeneous myometrium. Findings may related to
diffuse fibroid change.
2. Normal appearance of ovaries and endometrium.

## 2022-06-07 ENCOUNTER — Other Ambulatory Visit: Payer: Self-pay

## 2022-06-07 ENCOUNTER — Encounter: Payer: Self-pay | Admitting: Physical Therapy

## 2022-06-07 ENCOUNTER — Ambulatory Visit: Payer: Self-pay | Attending: Orthopedic Surgery | Admitting: Physical Therapy

## 2022-06-07 DIAGNOSIS — M6281 Muscle weakness (generalized): Secondary | ICD-10-CM | POA: Insufficient documentation

## 2022-06-07 DIAGNOSIS — R600 Localized edema: Secondary | ICD-10-CM | POA: Insufficient documentation

## 2022-06-07 DIAGNOSIS — R2689 Other abnormalities of gait and mobility: Secondary | ICD-10-CM | POA: Insufficient documentation

## 2022-06-07 DIAGNOSIS — M25572 Pain in left ankle and joints of left foot: Secondary | ICD-10-CM | POA: Insufficient documentation

## 2022-06-07 NOTE — Therapy (Signed)
OUTPATIENT PHYSICAL THERAPY LOWER EXTREMITY EVALUATION   Patient Name: Joanna Whitney MRN: [id number] DOB:[birth year], 42 y.o., female Today's Date: 06/07/2022  END OF SESSION:  PT End of Session - 06/07/22 0850     Visit Number 1    Number of Visits 17    Date for PT Re-Evaluation 08/02/22    Authorization Type none    PT Start Time 0850    PT Stop Time 0931    PT Time Calculation (min) 41 min    Activity Tolerance Patient tolerated treatment well;No increased pain    Behavior During Therapy WFL for tasks assessed/performed             Past Medical History:  Diagnosis Date   Ankle fracture    Left   GERD (gastroesophageal reflux disease)    Gestational diabetes    Hx of toxoplasmosis    Medical history non-contributory    Past Surgical History:  Procedure Laterality Date   NO PAST SURGERIES     ORIF ANKLE FRACTURE Left 03/03/2022   Procedure: OPEN REDUCTION INTERNAL FIXATION (ORIF) ANKLE FRACTURE;  Surgeon: Teryl Lucy, MD;  Location: [facility] OR;  Service: Orthopedics;  Laterality: Left;   SYNDESMOSIS REPAIR Left 03/03/2022   Procedure: SYNDESMOSIS REPAIR;  Surgeon: Teryl Lucy, MD;  Location: [facility] OR;  Service: Orthopedics;  Laterality: Left;   Patient Active Problem List   Diagnosis Date Noted   History of gestational diabetes 06/13/2017   Obesity (BMI 30-39.9) 03/09/2017   Language barrier 03/09/2017    PCP: No PCP in chart  REFERRING PROVIDER: Teryl Lucy, MD  REFERRING DIAG: left ankle open reduction and internal fixation dos 03/03/22  THERAPY DIAG:  Pain in left ankle and joints of left foot  Other abnormalities of gait and mobility  Localized edema  Muscle weakness (generalized)  Rationale for Evaluation and Treatment: Rehabilitation  ONSET DATE: 03/03/22 L ankle ORIF   SUBJECTIVE:   SUBJECTIVE STATEMENT: Pt states in February she had a fall at home where she turned her ankle. EMS was called, went to [facility]. States imaging showed a  fracture but was initially deemed nonoperative. States she followed up with ortho OP and ended up getting surgery a couple weeks later. States that since surgery she has had significant pain, difficulty mobilizing. Is using axillary crutches vs RW. Pt states she is now WBAT, has difficulty due to pain. Saw MD last month, has another follow up tomorrow. States that she has been told things are healing well and that they have been taking X rays. States she still has sutures/tape and has not been able to wash her surgical limb yet. Denies N/T, has fluctuating swelling based on activity. States she is typically primary caretaker of home but has been having significant difficulty w/ all ADLs/housework since surgery. Politely declines video interpreter in favor of her son interpreting.   PERTINENT HISTORY: Unremarkable per chart review, pt states she occasionally has random palpitations but received cardiac work up which was reportedly unremarkable PAIN:  Are you having pain: 3/10 Location/description: around joint line and on bottom of foot while WB Best-worst over past week: 0-7/10  - aggravating factors: WB, ankle movement  - Easing factors: rest, elevation  PRECAUTIONS: fall risk  WEIGHT BEARING RESTRICTIONS: No (pt states she is WBAT but has difficulty bearing weight)  FALLS:  Has patient fallen in last 6 months? Yes. Number of falls 1 fall, precipitating episode   LIVING ENVIRONMENT: 1 story 4 STE no rail Has axillary crutches and  RW Lives w/ kids (4 years, 7 years, 86 years, 22 years old) and their father   OCCUPATION: Magazine features editor   PLOF: Independent  PATIENT GOALS: less pain, use ankle more   NEXT MD VISIT: tomorrow   OBJECTIVE:   DIAGNOSTIC FINDINGS:  S/p ORIF L ankle 03/03/22 Pt states she is WBAT   PATIENT SURVEYS:  FOTO 40 current, 71 predicted  COGNITION: Overall cognitive status: Within functional limits for tasks assessed     SENSATION: Denies neuro  complaints, appears grossly intact during palpation exam  EDEMA/OBSERVATION:  Mild swelling noted about ankle joint  Pt is noted to still have tape/surgical markings on her foot, significantly dried skin - states she is not sure if she is allowed to remove it or wash foot. Advised to discuss with physician at their appt tomorrow for self care   PALPATION: Tenderness throughout medial and lateral portions of gastroc/soleus more pronounced distally towards achilles tendon.  LOWER EXTREMITY ROM:  Active ROM Right eval Left eval  Hip flexion    Hip extension    Hip abduction    Hip adduction    Hip internal rotation    Hip external rotation    Knee flexion    Knee extension    Ankle dorsiflexion WFL -12 from neutral (passive)  Ankle plantarflexion WFL 30deg (passive)  Ankle inversion  NT  Ankle eversion  NT   (Blank rows = not tested) Comments: no AROM L ankle, compensations noted at toes  LOWER EXTREMITY MMT:  MMT Right eval Left eval  Knee flexion    Knee extension    Ankle dorsiflexion    Ankle plantarflexion    Ankle inversion    Ankle eversion     (Blank rows = not tested) Comments: NT on eval given lack of active movement in ankle joint   FUNCTIONAL TESTS:  Sit to stand: BUE support from raised mat, reduced WB through surgical limb with weight shift towards R, increased time and effort  GAIT: Distance walked: within clinic Assistive device utilized: Crutches Level of assistance: Modified independence Comments: hop to pattern, pt treats surgical limb mostly as NWB with occasional toe touch   TODAY'S TREATMENT:                                                                                                                              [facility] Adult PT Treatment:                                                DATE: 06/07/22 Therapeutic Exercise: Seated calf stretch w/ towel x30sec HEP handout and education on safe performance    PATIENT EDUCATION:  Education  details: Pt education on PT impairments, prognosis, and POC. Informed consent. Rationale for interventions, safe/appropriate HEP performance. Following up w/ provider re: post op self  care  Person educated: Patient Education method: Explanation, Demonstration, Tactile cues, Verbal cues, and Handouts Education comprehension: verbalized understanding, returned demonstration, verbal cues required, tactile cues required, and needs further education    HOME EXERCISE PROGRAM: Access Code: [email or web address] URL: [email or web address] Date: 06/07/2022 Prepared by: Fransisco Hertz  Exercises - Seated Calf Stretch with Strap  - 1 x daily - 7 x weekly - 1-3 sets - 1-3 reps - 30sec hold  ASSESSMENT:  CLINICAL IMPRESSION: Pt is a pleasant 42 year old woman who arrives to PT evaluation on this date for LE pain s/p L ankle ORIF in February 2024, states she is WBAT but has difficulty bearing weight due to pain. Pt reports difficulty with functional mobility, WB, ADLs, and household activities due to pain. During today's session pt demonstrates no active ROM of her ankle (notable compensations with toe/foot musculature), limited PROM in sagittal plane, and impaired functional/gait mechanics, all of which are limiting ability to perform aforementioned activities. Also advised to discuss post surgical self care of limb with her surgeon tomorrow (see edema/observation section above). Recommend skilled PT to address aforementioned deficits to improve functional independence/tolerance. No adverse events, tolerates HEP well. Pt departs today's session in no acute distress, all voiced questions/concerns addressed appropriately from PT perspective.    OBJECTIVE IMPAIRMENTS: Abnormal gait, decreased activity tolerance, decreased balance, decreased endurance, decreased mobility, difficulty walking, decreased ROM, decreased strength, and pain.   ACTIVITY LIMITATIONS: carrying, lifting, bending, standing, squatting,  stairs, transfers, and locomotion level  PARTICIPATION LIMITATIONS: meal prep, cleaning, laundry, shopping, and community activity  PERSONAL FACTORS: Time since onset of injury/illness/exacerbation are also affecting patient's functional outcome.   REHAB POTENTIAL: Good  CLINICAL DECISION MAKING: Stable/uncomplicated  EVALUATION COMPLEXITY: Low   GOALS: Goals reviewed with patient? No  SHORT TERM GOALS: Target date: 07/05/2022 Pt will demonstrate appropriate understanding and performance of initially prescribed HEP in order to facilitate improved independence with management of symptoms.  Baseline: HEP provided on eval Goal status: INITIAL   2. Pt will score greater than or equal to 52 on FOTO in order to demonstrate improved perception of function due to symptoms.  Baseline: 40  Goal status: INITIAL    LONG TERM GOALS: Target date: 08/02/2022 Pt will score 67 or greater on FOTO in order to demonstrate improved perception of function due to symptoms.  Baseline: 40 Goal status: INITIAL  2.  Pt will demonstrate at least 0 degrees of ankle DF AROM in order to facilitate improved tolerance to functional movements such as walking/stairs.  Baseline: see ROM chart above Goal status: INITIAL  3.  Pt will be able to ambulate at least 555ft with LRAD and appropriate gait mechanics with less than 2pt increase in pain on NPS in order to promote improved independence w/ household navigation. Baseline: axillary crutches, treats surgical limb as NWB Goal status: INITIAL  4.  Pt will be able to perform 5 consecutive sit to stands without UE support, grossly symmetrical WB, less than 2 pt increase in pain for improved safety w/ transfers. Baseline: 1 repetition, altered mechanics and B UE support Goal status: INITIAL   5. Pt will demonstrate appropriate performance of final prescribed HEP in order to facilitate improved self-management of symptoms post-discharge.   Baseline: initial HEP  prescribed  Goal status: INITIAL    6. Pt will report ability to perform lower body dressing without assist for improved independence w/ ADLs.  Baseline: son assists w/ donning sock during session due to difficulty   Goal status:  INITIAL   PLAN:  PT FREQUENCY: 2x/week  PT DURATION: 8 weeks  PLANNED INTERVENTIONS: Therapeutic exercises, Therapeutic activity, Neuromuscular re-education, Balance training, Gait training, Patient/Family education, Self Care, Joint mobilization, Stair training, Aquatic Therapy, Dry Needling, Electrical stimulation, Cryotherapy, Moist heat, Taping, Vasopneumatic device, Manual therapy, and Re-evaluation  PLAN FOR NEXT SESSION: review/update HEP, emphasis on passive ankle mobility. WB and gait mechanics    Ashley Murrain PT, DPT 06/07/2022 1:01 PM

## 2022-06-15 NOTE — Therapy (Signed)
OUTPATIENT PHYSICAL THERAPY TREATMENT NOTE   Patient Name: Joanna Whitney MRN: [id number] DOB:04/16/1980, 42 y.o., female Today's Date: 06/17/2022  PCP: No PCP in chart   REFERRING PROVIDER: Teryl Lucy, MD   END OF SESSION:   PT End of Session - 06/17/22 1553     Visit Number 2    Date for PT Re-Evaluation 08/02/22    Authorization Type none    PT Start Time 0800    PT Stop Time 0855    PT Time Calculation (min) 55 min    Activity Tolerance Patient tolerated treatment well    Behavior During Therapy WFL for tasks assessed/performed             Past Medical History:  Diagnosis Date   Ankle fracture    Left   GERD (gastroesophageal reflux disease)    Gestational diabetes    Hx of toxoplasmosis    Medical history non-contributory    Past Surgical History:  Procedure Laterality Date   NO PAST SURGERIES     ORIF ANKLE FRACTURE Left 03/03/2022   Procedure: OPEN REDUCTION INTERNAL FIXATION (ORIF) ANKLE FRACTURE;  Surgeon: Teryl Lucy, MD;  Location: [facility] OR;  Service: Orthopedics;  Laterality: Left;   SYNDESMOSIS REPAIR Left 03/03/2022   Procedure: SYNDESMOSIS REPAIR;  Surgeon: Teryl Lucy, MD;  Location: [facility] OR;  Service: Orthopedics;  Laterality: Left;   Patient Active Problem List   Diagnosis Date Noted   History of gestational diabetes 06/13/2017   Obesity (BMI 30-39.9) 03/09/2017   Language barrier 03/09/2017    REFERRING DIAG: left ankle open reduction and internal fixation dos 03/03/22    THERAPY DIAG:  Pain in left ankle and joints of left foot  Other abnormalities of gait and mobility  Localized edema  Muscle weakness (generalized)  Rationale for Evaluation and Treatment Rehabilitation  ONSET DATE: 03/03/22 L ankle ORIF    SUBJECTIVE:    SUBJECTIVE STATEMENT: Pt states her L ankle has improve a little bit. Pt reports she has had appt with her surgeon.  PERTINENT HISTORY: Unremarkable per chart review, pt states she occasionally has  random palpitations but received cardiac work up which was reportedly unremarkable PAIN:  Are you having pain: 2/10 Location/description: around joint line and on bottom of foot while WB Best-worst over past week: 0-7/10  - aggravating factors: WB, ankle movement  - Easing factors: rest, elevation   PRECAUTIONS: fall risk   WEIGHT BEARING RESTRICTIONS: No (pt states she is WBAT but has difficulty bearing weight)   FALLS:  Has patient fallen in last 6 months? Yes. Number of falls 1 fall, precipitating episode    LIVING ENVIRONMENT: 1 story 4 STE no rail Has axillary crutches and RW Lives w/ kids (4 years, 7 years, 31 years, 76 years old) and their father    OCCUPATION: Magazine features editor    PLOF: Independent   PATIENT GOALS: less pain, use ankle more    NEXT MD VISIT: tomorrow    OBJECTIVE: (objective measures completed at initial evaluation unless otherwise dated)   DIAGNOSTIC FINDINGS:  S/p ORIF L ankle 03/03/22 Pt states she is WBAT    PATIENT SURVEYS:  FOTO 40 current, 77 predicted   COGNITION: Overall cognitive status: Within functional limits for tasks assessed                         SENSATION: Denies neuro complaints, appears grossly intact during palpation exam   EDEMA/OBSERVATION:  Mild swelling noted about ankle joint  Pt is noted to still have tape/surgical markings on her foot, significantly dried skin - states she is not sure if she is allowed to remove it or wash foot. Advised to discuss with physician at their appt tomorrow for self care    PALPATION: Tenderness throughout medial and lateral portions of gastroc/soleus more pronounced distally towards achilles tendon.   LOWER EXTREMITY ROM:   Active ROM Right eval Left eval  Hip flexion      Hip extension      Hip abduction      Hip adduction      Hip internal rotation      Hip external rotation      Knee flexion      Knee extension      Ankle dorsiflexion WFL -12 from neutral (passive)   Ankle plantarflexion WFL 30deg (passive)  Ankle inversion   NT  Ankle eversion   NT   (Blank rows = not tested) Comments: no AROM L ankle, compensations noted at toes   LOWER EXTREMITY MMT:   MMT Right eval Left eval  Knee flexion      Knee extension      Ankle dorsiflexion      Ankle plantarflexion      Ankle inversion      Ankle eversion       (Blank rows = not tested) Comments: NT on eval given lack of active movement in ankle joint     FUNCTIONAL TESTS:  Sit to stand: BUE support from raised mat, reduced WB through surgical limb with weight shift towards R, increased time and effort   GAIT: Distance walked: within clinic Assistive device utilized: Crutches Level of assistance: Modified independence Comments: hop to pattern, pt treats surgical limb mostly as NWB with occasional toe touch     TODAY'S TREATMENT:   [facility] Adult PT Treatment:                                                DATE: 06/17/22 Therapeutic Exercise: Seated calf stretch w/ towel 3x 30sec AROM L ankle DF/PF, circles Seated A/P slides for DF ROM Towel scrunches AP and lateral wt shifting in standing c counter Therapeutic Activity: Gait training c crutches for heel/toe pattern. Pt was able to progress to a heel/toe pattern c an improved step length of the R LE, approx 25% of the time, stepping past the L foot. Modalities: Cold pack x10 mins c elevation Self Care: Use of elevation and cold pack                                                                                                                            [facility] Adult PT Treatment:  DATE: 06/07/22 Therapeutic Exercise: Seated calf stretch w/ towel x30sec HEP handout and education on safe performance      PATIENT EDUCATION:  Education details: Pt education on PT impairments, prognosis, and POC. Informed consent. Rationale for interventions, safe/appropriate HEP performance. Following up w/  provider re: post op self care  Person educated: Patient Education method: Explanation, Demonstration, Tactile cues, Verbal cues, and Handouts Education comprehension: verbalized understanding, returned demonstration, verbal cues required, tactile cues required, and needs further education     HOME EXERCISE PROGRAM: Access Code: [email or web address] URL: [email or web address] Date: 06/17/2022 Prepared by: Joellyn Rued  Exercises - Seated Calf Stretch with Strap  - 2 x daily - 7 x weekly - 1-3 sets - 1-3 reps - 30sec hold - Supine Ankle Dorsiflexion and Plantarflexion AROM  - 2 x daily - 7 x weekly - 2 sets - 10 reps - Supine Ankle Circles  - 2 x daily - 7 x weekly - 2 sets - 10 reps - Seated Heel Slide  - 2 x daily - 7 x weekly - 2 sets - 10 reps - 5 hold - Standing Anterior Posterior Weight Shift with Chair  - 2 x daily - 7 x weekly - 2 sets - 10 reps - 5 hold - Standing Weight Shift Side to Side  - 2 x daily - 7 x weekly - 2 sets - 10 reps - 5 hold   ASSESSMENT:   CLINICAL IMPRESSION: Pt returns to PT for her second appt. Pt was using crutches c PWB of the L foot c decreased DF, and a step to gait pattern of the R LE. Some steri-strips are still attached, however pt reports she is washing her L foot consistently and some have come off. Light scabbing of the L lateral malleolus is present. There were no signs or symptoms of infection. PT was completed to address L ankle ROM with stretching, AROM and weight shifting therex. Weight shifting was transitioned into gait training with pt making good progress. At end of training, pt demonstrated a heel/toe pattern c an improved step length of the R LE, approx 25% of the time, stepping past the L foot. Pt reported she tell her ankle was moving better. A cold pack was applied at the end of session for symptom management. Pt tolerated PT today without adverse effects. Pt will continue to benefit from skilled PT to address impairments for improved  function.    OBJECTIVE IMPAIRMENTS: Abnormal gait, decreased activity tolerance, decreased balance, decreased endurance, decreased mobility, difficulty walking, decreased ROM, decreased strength, and pain.    ACTIVITY LIMITATIONS: carrying, lifting, bending, standing, squatting, stairs, transfers, and locomotion level   PARTICIPATION LIMITATIONS: meal prep, cleaning, laundry, shopping, and community activity   PERSONAL FACTORS: Time since onset of injury/illness/exacerbation are also affecting patient's functional outcome.    REHAB POTENTIAL: Good   CLINICAL DECISION MAKING: Stable/uncomplicated   EVALUATION COMPLEXITY: Low     GOALS: Goals reviewed with patient? No   SHORT TERM GOALS: Target date: 07/05/2022 Pt will demonstrate appropriate understanding and performance of initially prescribed HEP in order to facilitate improved independence with management of symptoms.  Baseline: HEP provided on eval Goal status: INITIAL    2. Pt will score greater than or equal to 52 on FOTO in order to demonstrate improved perception of function due to symptoms.            Baseline: 40            Goal status: INITIAL  LONG TERM GOALS: Target date: 08/02/2022 Pt will score 67 or greater on FOTO in order to demonstrate improved perception of function due to symptoms.  Baseline: 40 Goal status: INITIAL   2.  Pt will demonstrate at least 0 degrees of ankle DF AROM in order to facilitate improved tolerance to functional movements such as walking/stairs.  Baseline: see ROM chart above Goal status: INITIAL   3.  Pt will be able to ambulate at least 524ft with LRAD and appropriate gait mechanics with less than 2pt increase in pain on NPS in order to promote improved independence w/ household navigation. Baseline: axillary crutches, treats surgical limb as NWB Goal status: INITIAL   4.  Pt will be able to perform 5 consecutive sit to stands without UE support, grossly symmetrical WB, less than 2  pt increase in pain for improved safety w/ transfers. Baseline: 1 repetition, altered mechanics and B UE support Goal status: INITIAL    5. Pt will demonstrate appropriate performance of final prescribed HEP in order to facilitate improved self-management of symptoms post-discharge.             Baseline: initial HEP prescribed            Goal status: INITIAL     6. Pt will report ability to perform lower body dressing without assist for improved independence w/ ADLs.            Baseline: son assists w/ donning sock during session due to difficulty                    Goal status: INITIAL     PLAN:   PT FREQUENCY: 2x/week   PT DURATION: 8 weeks   PLANNED INTERVENTIONS: Therapeutic exercises, Therapeutic activity, Neuromuscular re-education, Balance training, Gait training, Patient/Family education, Self Care, Joint mobilization, Stair training, Aquatic Therapy, Dry Needling, Electrical stimulation, Cryotherapy, Moist heat, Taping, Vasopneumatic device, Manual therapy, and Re-evaluation   PLAN FOR NEXT SESSION: review/update HEP, emphasis on passive ankle mobility. WB and gait mechanics   [employer], PT 06/17/2022, 4:10 PM

## 2022-06-17 ENCOUNTER — Ambulatory Visit: Payer: Self-pay

## 2022-06-17 DIAGNOSIS — R2689 Other abnormalities of gait and mobility: Secondary | ICD-10-CM

## 2022-06-17 DIAGNOSIS — M6281 Muscle weakness (generalized): Secondary | ICD-10-CM

## 2022-06-17 DIAGNOSIS — M25572 Pain in left ankle and joints of left foot: Secondary | ICD-10-CM

## 2022-06-17 DIAGNOSIS — R600 Localized edema: Secondary | ICD-10-CM

## 2022-06-22 ENCOUNTER — Ambulatory Visit: Payer: Self-pay

## 2022-06-28 ENCOUNTER — Ambulatory Visit: Payer: Self-pay | Admitting: Physical Therapy

## 2022-06-30 ENCOUNTER — Encounter: Payer: Self-pay | Admitting: Physical Therapy

## 2022-06-30 ENCOUNTER — Ambulatory Visit: Payer: Self-pay | Attending: Orthopedic Surgery | Admitting: Physical Therapy

## 2022-06-30 DIAGNOSIS — R2689 Other abnormalities of gait and mobility: Secondary | ICD-10-CM | POA: Insufficient documentation

## 2022-06-30 DIAGNOSIS — R600 Localized edema: Secondary | ICD-10-CM | POA: Insufficient documentation

## 2022-06-30 DIAGNOSIS — M25572 Pain in left ankle and joints of left foot: Secondary | ICD-10-CM | POA: Insufficient documentation

## 2022-06-30 DIAGNOSIS — M6281 Muscle weakness (generalized): Secondary | ICD-10-CM | POA: Insufficient documentation

## 2022-06-30 NOTE — Therapy (Signed)
OUTPATIENT PHYSICAL THERAPY TREATMENT NOTE   Patient Name: Joanna Whitney MRN: [id number] DOB:[birth year], 42 y.o., female Today's Date: 06/30/2022  PCP: No PCP in chart   REFERRING PROVIDER: Teryl Lucy, MD   END OF SESSION:   PT End of Session - 06/30/22 0758     Visit Number 3    Number of Visits 17    Date for PT Re-Evaluation 08/02/22    Authorization Type none    PT Start Time 0800    PT Stop Time 0845    PT Time Calculation (min) 45 min             Past Medical History:  Diagnosis Date   Ankle fracture    Left   GERD (gastroesophageal reflux disease)    Gestational diabetes    Hx of toxoplasmosis    Medical history non-contributory    Past Surgical History:  Procedure Laterality Date   NO PAST SURGERIES     ORIF ANKLE FRACTURE Left 03/03/2022   Procedure: OPEN REDUCTION INTERNAL FIXATION (ORIF) ANKLE FRACTURE;  Surgeon: Teryl Lucy, MD;  Location: [facility] OR;  Service: Orthopedics;  Laterality: Left;   SYNDESMOSIS REPAIR Left 03/03/2022   Procedure: SYNDESMOSIS REPAIR;  Surgeon: Teryl Lucy, MD;  Location: [facility] OR;  Service: Orthopedics;  Laterality: Left;   Patient Active Problem List   Diagnosis Date Noted   History of gestational diabetes 06/13/2017   Obesity (BMI 30-39.9) 03/09/2017   Language barrier 03/09/2017    REFERRING DIAG: left ankle open reduction and internal fixation dos 03/03/22    THERAPY DIAG:  Pain in left ankle and joints of left foot  Other abnormalities of gait and mobility  Rationale for Evaluation and Treatment Rehabilitation  ONSET DATE: 03/03/22 L ankle ORIF    SUBJECTIVE:    SUBJECTIVE STATEMENT:  A lot of pain when I put weight on it 5-6/10. It is improving.   PERTINENT HISTORY: Unremarkable per chart review, pt states she occasionally has random palpitations but received cardiac work up which was reportedly unremarkable PAIN:  Are you having pain: 5-6/10 Location/description: around joint line and on top of  ankle Best-worst over past week: 0-7/10  - aggravating factors: WB, ankle movement  - Easing factors: rest, elevation   PRECAUTIONS: fall risk   WEIGHT BEARING RESTRICTIONS: No (pt states she is WBAT but has difficulty bearing weight)   FALLS:  Has patient fallen in last 6 months? Yes. Number of falls 1 fall, precipitating episode    LIVING ENVIRONMENT: 1 story 4 STE no rail Has axillary crutches and RW Lives w/ kids (4 years, 7 years, 23 years, 16 years old) and their father    OCCUPATION: Magazine features editor    PLOF: Independent   PATIENT GOALS: less pain, use ankle more    NEXT MD VISIT: tomorrow    OBJECTIVE: (objective measures completed at initial evaluation unless otherwise dated)   DIAGNOSTIC FINDINGS:  S/p ORIF L ankle 03/03/22 Pt states she is WBAT    PATIENT SURVEYS:  FOTO 40 current, 28 predicted   COGNITION: Overall cognitive status: Within functional limits for tasks assessed                         SENSATION: Denies neuro complaints, appears grossly intact during palpation exam   EDEMA/OBSERVATION:  Mild swelling noted about ankle joint  Pt is noted to still have tape/surgical markings on her foot, significantly dried skin - states she is not sure if she is  allowed to remove it or wash foot. Advised to discuss with physician at their appt tomorrow for self care    PALPATION: Tenderness throughout medial and lateral portions of gastroc/soleus more pronounced distally towards achilles tendon.   LOWER EXTREMITY ROM:   Active ROM Right eval Left eval 06/30/22  Hip flexion       Hip extension       Hip abduction       Hip adduction       Hip internal rotation       Hip external rotation       Knee flexion       Knee extension       Ankle dorsiflexion WFL -12 from neutral (passive) Neutral PROM  Ankle plantarflexion WFL 30deg (passive)   Ankle inversion   NT   Ankle eversion   NT    (Blank rows = not tested) Comments: no AROM L ankle,  compensations noted at toes   LOWER EXTREMITY MMT:   MMT Right eval Left eval  Knee flexion      Knee extension      Ankle dorsiflexion      Ankle plantarflexion      Ankle inversion      Ankle eversion       (Blank rows = not tested) Comments: NT on eval given lack of active movement in ankle joint     FUNCTIONAL TESTS:  Sit to stand: BUE support from raised mat, reduced WB through surgical limb with weight shift towards R, increased time and effort   GAIT: Distance walked: within clinic Assistive device utilized: Crutches Level of assistance: Modified independence Comments: hop to pattern, pt treats surgical limb mostly as NWB with occasional toe touch     TODAY'S TREATMENT:   [facility] Adult PT Treatment:                                                DATE: 06/30/22 Therapeutic Exercise: Towel scrunches  Seated heel raise Self ROM of toes and ankle in figure 4 position Supine DF stretch with strap  Supine hamstring and ITB stretch Piriformis push and pull Seated wooden rocker board A/P rocking Seated heel and toe raises- tactile cues  Standing weight shifting lateral 10 sec x 5  Mini gastric stretch staggered step with mini lunge x 5 for 5 sec  Manual Therapy: Passive ROM toes and ankle, metatarsal mobs  Therapeutic Activity: Gait in clinic with bilat crutch, cues for heel strike and step length.     [facility] Adult PT Treatment:                                                DATE: 06/17/22 Therapeutic Exercise: Seated calf stretch w/ towel 3x 30sec AROM L ankle DF/PF, circles Seated A/P slides for DF ROM Towel scrunches AP and lateral wt shifting in standing c counter Therapeutic Activity: Gait training c crutches for heel/toe pattern. Pt was able to progress to a heel/toe pattern c an improved step length of the R LE, approx 25% of the time, stepping past the L foot. Modalities: Cold pack x10 mins c elevation Self Care: Use of elevation and cold pack  [facility] Adult PT Treatment:                                                DATE: 06/07/22 Therapeutic Exercise: Seated calf stretch w/ towel x30sec HEP handout and education on safe performance      PATIENT EDUCATION:  Education details: Pt education on PT impairments, prognosis, and POC. Informed consent. Rationale for interventions, safe/appropriate HEP performance. Following up w/ provider re: post op self care  Person educated: Patient Education method: Explanation, Demonstration, Tactile cues, Verbal cues, and Handouts Education comprehension: verbalized understanding, returned demonstration, verbal cues required, tactile cues required, and needs further education     HOME EXERCISE PROGRAM: Access Code: [email or web address] URL: [email or web address] Date: 06/17/2022 Prepared by: Joellyn Rued  Exercises - Seated Calf Stretch with Strap  - 2 x daily - 7 x weekly - 1-3 sets - 1-3 reps - 30sec hold - Supine Ankle Dorsiflexion and Plantarflexion AROM  - 2 x daily - 7 x weekly - 2 sets - 10 reps - Supine Ankle Circles  - 2 x daily - 7 x weekly - 2 sets - 10 reps - Seated Heel Slide  - 2 x daily - 7 x weekly - 2 sets - 10 reps - 5 hold - Standing Anterior Posterior Weight Shift with Chair  - 2 x daily - 7 x weekly - 2 sets - 10 reps - 5 hold - Standing Weight Shift Side to Side  - 2 x daily - 7 x weekly - 2 sets - 10 reps - 5 hold   ASSESSMENT:   CLINICAL IMPRESSION: Pt returns to PT for her third appt. She arrives with bilateral crutches and step though pattern, decreased heel strike. She reports compliance with HEP and endorses improvement in mobility of ankle. Session focused on intrinsic foot strengthening and passive ROM. Pt educated on self PROM of ankle and toes seated in figure 4 position. She was found to have increased left hip tighness compared to the right which she  reports has been present since her fall. At end of session her gait improved and pain decreased. She was able to perform heel strike and was given cues for step length. Pain reduced from 6/10 to 3/10 during gait. Steri strips still present with dry skin around ankle. Asked pt to use wash cloth at home to exfoliate her skin around the incisions.  Pt will continue to benefit from skilled PT to address impairments for improved function.     OBJECTIVE IMPAIRMENTS: Abnormal gait, decreased activity tolerance, decreased balance, decreased endurance, decreased mobility, difficulty walking, decreased ROM, decreased strength, and pain.    ACTIVITY LIMITATIONS: carrying, lifting, bending, standing, squatting, stairs, transfers, and locomotion level   PARTICIPATION LIMITATIONS: meal prep, cleaning, laundry, shopping, and community activity   PERSONAL FACTORS: Time since onset of injury/illness/exacerbation are also affecting patient's functional outcome.    REHAB POTENTIAL: Good   CLINICAL DECISION MAKING: Stable/uncomplicated   EVALUATION COMPLEXITY: Low     GOALS: Goals reviewed with patient? No   SHORT TERM GOALS: Target date: 07/05/2022 Pt will demonstrate appropriate understanding and performance of initially prescribed HEP in order to facilitate improved independence with management of symptoms.  Baseline: HEP provided on eval Goal status: INITIAL    2. Pt will score greater than or equal to 52 on FOTO in order to demonstrate improved perception  of function due to symptoms.            Baseline: 40            Goal status: INITIAL     LONG TERM GOALS: Target date: 08/02/2022 Pt will score 67 or greater on FOTO in order to demonstrate improved perception of function due to symptoms.  Baseline: 40 Goal status: INITIAL   2.  Pt will demonstrate at least 0 degrees of ankle DF AROM in order to facilitate improved tolerance to functional movements such as walking/stairs.  Baseline: see ROM chart  above Goal status: INITIAL   3.  Pt will be able to ambulate at least 520ft with LRAD and appropriate gait mechanics with less than 2pt increase in pain on NPS in order to promote improved independence w/ household navigation. Baseline: axillary crutches, treats surgical limb as NWB Goal status: INITIAL   4.  Pt will be able to perform 5 consecutive sit to stands without UE support, grossly symmetrical WB, less than 2 pt increase in pain for improved safety w/ transfers. Baseline: 1 repetition, altered mechanics and B UE support Goal status: INITIAL    5. Pt will demonstrate appropriate performance of final prescribed HEP in order to facilitate improved self-management of symptoms post-discharge.             Baseline: initial HEP prescribed            Goal status: INITIAL     6. Pt will report ability to perform lower body dressing without assist for improved independence w/ ADLs.            Baseline: son assists w/ donning sock during session due to difficulty                    Goal status: INITIAL     PLAN:   PT FREQUENCY: 2x/week   PT DURATION: 8 weeks   PLANNED INTERVENTIONS: Therapeutic exercises, Therapeutic activity, Neuromuscular re-education, Balance training, Gait training, Patient/Family education, Self Care, Joint mobilization, Stair training, Aquatic Therapy, Dry Needling, Electrical stimulation, Cryotherapy, Moist heat, Taping, Vasopneumatic device, Manual therapy, and Re-evaluation   PLAN FOR NEXT SESSION: review/update HEP, emphasis on passive ankle mobility. WB and gait mechanics   Jannette Spanner, PTA 06/30/22 10:35 AM Phone: [phone] Fax: [phone]

## 2022-07-05 ENCOUNTER — Ambulatory Visit: Payer: Self-pay | Admitting: Physical Therapy

## 2022-07-05 ENCOUNTER — Encounter: Payer: Self-pay | Admitting: Physical Therapy

## 2022-07-05 DIAGNOSIS — R2689 Other abnormalities of gait and mobility: Secondary | ICD-10-CM

## 2022-07-05 DIAGNOSIS — M25572 Pain in left ankle and joints of left foot: Secondary | ICD-10-CM

## 2022-07-05 NOTE — Therapy (Signed)
OUTPATIENT PHYSICAL THERAPY TREATMENT NOTE   Patient Name: Joanna Whitney MRN: [id number] DOB:[birth year], 42 y.o., female Today's Date: 07/05/2022  PCP: No PCP in chart   REFERRING PROVIDER: Teryl Lucy, MD   END OF SESSION:   PT End of Session - 07/05/22 0825     Visit Number 4    Number of Visits 17    Date for PT Re-Evaluation 08/02/22    Authorization Type none    PT Start Time 0802    PT Stop Time 0845    PT Time Calculation (min) 43 min             Past Medical History:  Diagnosis Date   Ankle fracture    Left   GERD (gastroesophageal reflux disease)    Gestational diabetes    Hx of toxoplasmosis    Medical history non-contributory    Past Surgical History:  Procedure Laterality Date   NO PAST SURGERIES     ORIF ANKLE FRACTURE Left 03/03/2022   Procedure: OPEN REDUCTION INTERNAL FIXATION (ORIF) ANKLE FRACTURE;  Surgeon: Teryl Lucy, MD;  Location: [facility] OR;  Service: Orthopedics;  Laterality: Left;   SYNDESMOSIS REPAIR Left 03/03/2022   Procedure: SYNDESMOSIS REPAIR;  Surgeon: Teryl Lucy, MD;  Location: [facility] OR;  Service: Orthopedics;  Laterality: Left;   Patient Active Problem List   Diagnosis Date Noted   History of gestational diabetes 06/13/2017   Obesity (BMI 30-39.9) 03/09/2017   Language barrier 03/09/2017    REFERRING DIAG: left ankle open reduction and internal fixation dos 03/03/22    THERAPY DIAG:  Pain in left ankle and joints of left foot  Other abnormalities of gait and mobility  Rationale for Evaluation and Treatment Rehabilitation  ONSET DATE: 03/03/22 L ankle ORIF    SUBJECTIVE:    SUBJECTIVE STATEMENT: The pain is better.   PERTINENT HISTORY: Unremarkable per chart review, pt states she occasionally has random palpitations but received cardiac work up which was reportedly unremarkable PAIN:  Are you having pain: 6/10 Location/description: around joint line and on top of ankle Best-worst over past week: 0-7/10  -  aggravating factors: WB, ankle movement  - Easing factors: rest, elevation   PRECAUTIONS: fall risk   WEIGHT BEARING RESTRICTIONS: No (pt states she is WBAT but has difficulty bearing weight)   FALLS:  Has patient fallen in last 6 months? Yes. Number of falls 1 fall, precipitating episode    LIVING ENVIRONMENT: 1 story 4 STE no rail Has axillary crutches and RW Lives w/ kids (4 years, 7 years, 61 years, 2 years old) and their father    OCCUPATION: Magazine features editor    PLOF: Independent   PATIENT GOALS: less pain, use ankle more    NEXT MD VISIT: tomorrow    OBJECTIVE: (objective measures completed at initial evaluation unless otherwise dated)   DIAGNOSTIC FINDINGS:  S/p ORIF L ankle 03/03/22 Pt states she is WBAT    PATIENT SURVEYS:  FOTO 40 current, 28 predicted   COGNITION: Overall cognitive status: Within functional limits for tasks assessed                         SENSATION: Denies neuro complaints, appears grossly intact during palpation exam   EDEMA/OBSERVATION:  Mild swelling noted about ankle joint  Pt is noted to still have tape/surgical markings on her foot, significantly dried skin - states she is not sure if she is allowed to remove it or wash foot. Advised to discuss with  physician at their appt tomorrow for self care    PALPATION: Tenderness throughout medial and lateral portions of gastroc/soleus more pronounced distally towards achilles tendon.   LOWER EXTREMITY ROM:   Active ROM Right eval Left eval 06/30/22 07/05/22  Hip flexion        Hip extension        Hip abduction        Hip adduction        Hip internal rotation        Hip external rotation        Knee flexion        Knee extension        Ankle dorsiflexion WFL -12 from neutral (passive) -5 from Neutral PROM -5 from Neutral PROM supine  Ankle plantarflexion WFL 30deg (passive)    Ankle inversion   NT    Ankle eversion   NT     (Blank rows = not tested) Comments: no AROM L ankle,  compensations noted at toes   LOWER EXTREMITY MMT:   MMT Right eval Left eval  Knee flexion      Knee extension      Ankle dorsiflexion      Ankle plantarflexion      Ankle inversion      Ankle eversion       (Blank rows = not tested) Comments: NT on eval given lack of active movement in ankle joint     FUNCTIONAL TESTS:  Sit to stand: BUE support from raised mat, reduced WB through surgical limb with weight shift towards R, increased time and effort   GAIT: Distance walked: within clinic Assistive device utilized: Crutches Level of assistance: Modified independence Comments: hop to pattern, pt treats surgical limb mostly as NWB with occasional toe touch     TODAY'S TREATMENT:   [facility] Adult PT Treatment:                                                DATE: 07/05/22 Therapeutic Exercise: Supine DF and PF with yellow band x 20 each  Supine inversion and eversion isometrics in ball x 20 each  Standing: forward stepping, x 10 each Standing Runners stretch and soleus stretch Standing March at counter Standing wooden rocker board A/P x 1 minute SLR flexion and abduction 10 x 2 left  Bridge x 10 SL Bridge x 10 each  Figure 4 stretch left  Seated heel raises from edge of rolled towel Seated towel scrunches Manual Therapy: A/P supine ankle mobs, metatarsal mobs, passive ROM all planes of ankle, passive toe flexion and extension  Therapeutic Activity: Gait with Single crutch  Stepping forward and back with each leg- 1 UE at counter,working on weight shift, heel strike and toe off    [facility] Adult PT Treatment:                                                DATE: 06/30/22 Therapeutic Exercise: Towel scrunches  Seated heel raise Self ROM of toes and ankle in figure 4 position Supine DF stretch with strap  Supine hamstring and ITB stretch Piriformis push and pull Seated wooden rocker board A/P rocking Seated heel and toe raises- tactile cues  Standing weight  shifting lateral  10 sec x 5  Mini gastric stretch staggered step with mini lunge x 5 for 5 sec  Manual Therapy: Passive ROM toes and ankle, metatarsal mobs  Therapeutic Activity: Gait in clinic with bilat crutch, cues for heel strike and step length.     [facility] Adult PT Treatment:                                                DATE: 06/17/22 Therapeutic Exercise: Seated calf stretch w/ towel 3x 30sec AROM L ankle DF/PF, circles Seated A/P slides for DF ROM Towel scrunches AP and lateral wt shifting in standing c counter Therapeutic Activity: Gait training c crutches for heel/toe pattern. Pt was able to progress to a heel/toe pattern c an improved step length of the R LE, approx 25% of the time, stepping past the L foot. Modalities: Cold pack x10 mins c elevation Self Care: Use of elevation and cold pack                                                                                                                            [facility] Adult PT Treatment:                                                DATE: 06/07/22 Therapeutic Exercise: Seated calf stretch w/ towel x30sec HEP handout and education on safe performance      PATIENT EDUCATION:  Education details: Pt education on PT impairments, prognosis, and POC. Informed consent. Rationale for interventions, safe/appropriate HEP performance. Following up w/ provider re: post op self care  Person educated: Patient Education method: Explanation, Demonstration, Tactile cues, Verbal cues, and Handouts Education comprehension: verbalized understanding, returned demonstration, verbal cues required, tactile cues required, and needs further education     HOME EXERCISE PROGRAM: Access Code: [email or web address] URL: [email or web address] Date: 06/17/2022 Prepared by: Joellyn Rued  Exercises - Seated Calf Stretch with Strap  - 2 x daily - 7 x weekly - 1-3 sets - 1-3 reps - 30sec hold - Supine Ankle Dorsiflexion and Plantarflexion AROM  - 2 x daily - 7 x  weekly - 2 sets - 10 reps - Supine Ankle Circles  - 2 x daily - 7 x weekly - 2 sets - 10 reps - Seated Heel Slide  - 2 x daily - 7 x weekly - 2 sets - 10 reps - 5 hold - Standing Anterior Posterior Weight Shift with Chair  - 2 x daily - 7 x weekly - 2 sets - 10 reps - 5 hold - Standing Weight Shift Side to Side  - 2 x daily - 7 x weekly - 2 sets - 10 reps -  5 hold   ASSESSMENT:   CLINICAL IMPRESSION: Pt arrives with bilateral crutches and step though pattern, improved heel strike. Continues to reports improvement in pain and mobility. Worked on gait and closed chain DF stretches. Continued with passive ankle ROM and gentle ankle mobs to improve ROM. She continues to lack DF in supine however appears to reach neutral in closed chain. Her pain improved by end of session and she reports decreased stiffness. Her steri strips are still in place. She did exfoliate the skin around the incisions. Requested that she leave her sock off at home to allow for the steri strips to dry up and fall off. She verbalized understanding via her son as Research officer, trade [employer]. Updated HEP with closed chain DF stretches.  Pt will continue to benefit from skilled PT to address impairments for improved function.     OBJECTIVE IMPAIRMENTS: Abnormal gait, decreased activity tolerance, decreased balance, decreased endurance, decreased mobility, difficulty walking, decreased ROM, decreased strength, and pain.    ACTIVITY LIMITATIONS: carrying, lifting, bending, standing, squatting, stairs, transfers, and locomotion level   PARTICIPATION LIMITATIONS: meal prep, cleaning, laundry, shopping, and community activity   PERSONAL FACTORS: Time since onset of injury/illness/exacerbation are also affecting patient's functional outcome.    REHAB POTENTIAL: Good   CLINICAL DECISION MAKING: Stable/uncomplicated   EVALUATION COMPLEXITY: Low     GOALS: Goals reviewed with patient? No   SHORT TERM GOALS: Target date: 07/05/2022 Pt  will demonstrate appropriate understanding and performance of initially prescribed HEP in order to facilitate improved independence with management of symptoms.  Baseline: HEP provided on eval Goal status: MET   2. Pt will score greater than or equal to 52 on FOTO in order to demonstrate improved perception of function due to symptoms.            Baseline: 40            Goal status: ONGOING  LONG TERM GOALS: Target date: 08/02/2022 Pt will score 67 or greater on FOTO in order to demonstrate improved perception of function due to symptoms.  Baseline: 40 Goal status: INITIAL   2.  Pt will demonstrate at least 0 degrees of ankle DF AROM in order to facilitate improved tolerance to functional movements such as walking/stairs.  Baseline: see ROM chart above Goal status: INITIAL   3.  Pt will be able to ambulate at least 572ft with LRAD and appropriate gait mechanics with less than 2pt increase in pain on NPS in order to promote improved independence w/ household navigation. Baseline: axillary crutches, treats surgical limb as NWB Goal status: INITIAL   4.  Pt will be able to perform 5 consecutive sit to stands without UE support, grossly symmetrical WB, less than 2 pt increase in pain for improved safety w/ transfers. Baseline: 1 repetition, altered mechanics and B UE support Goal status: INITIAL    5. Pt will demonstrate appropriate performance of final prescribed HEP in order to facilitate improved self-management of symptoms post-discharge.             Baseline: initial HEP prescribed            Goal status: INITIAL     6. Pt will report ability to perform lower body dressing without assist for improved independence w/ ADLs.            Baseline: son assists w/ donning sock during session due to difficulty  Goal status: INITIAL     PLAN:   PT FREQUENCY: 2x/week   PT DURATION: 8 weeks   PLANNED INTERVENTIONS: Therapeutic exercises, Therapeutic activity,  Neuromuscular re-education, Balance training, Gait training, Patient/Family education, Self Care, Joint mobilization, Stair training, Aquatic Therapy, Dry Needling, Electrical stimulation, Cryotherapy, Moist heat, Taping, Vasopneumatic device, Manual therapy, and Re-evaluation   PLAN FOR NEXT SESSION: review/update HEP, emphasis on passive ankle mobility. WB and gait mechanics   Jannette Spanner, PTA 07/05/22 10:15 AM Phone: [phone] Fax: [phone]

## 2022-07-07 ENCOUNTER — Encounter: Payer: Self-pay | Admitting: Physical Therapy

## 2022-07-07 ENCOUNTER — Ambulatory Visit: Payer: Self-pay | Admitting: Physical Therapy

## 2022-07-07 DIAGNOSIS — R600 Localized edema: Secondary | ICD-10-CM

## 2022-07-07 DIAGNOSIS — M6281 Muscle weakness (generalized): Secondary | ICD-10-CM

## 2022-07-07 DIAGNOSIS — R2689 Other abnormalities of gait and mobility: Secondary | ICD-10-CM

## 2022-07-07 DIAGNOSIS — M25572 Pain in left ankle and joints of left foot: Secondary | ICD-10-CM

## 2022-07-07 NOTE — Therapy (Signed)
OUTPATIENT PHYSICAL THERAPY TREATMENT NOTE   Patient Name: Joanna Whitney MRN: [id number] DOB:30-Oct-1980, 42 y.o., female Today's Date: 07/07/2022  PCP: No PCP in chart   REFERRING PROVIDER: Teryl Lucy, MD   END OF SESSION:   PT End of Session - 07/07/22 0804     Visit Number 5    Number of Visits 17    Date for PT Re-Evaluation 08/02/22    Authorization Type none    PT Start Time 0804    PT Stop Time 0843    PT Time Calculation (min) 39 min    Activity Tolerance Patient tolerated treatment well    Behavior During Therapy WFL for tasks assessed/performed              Past Medical History:  Diagnosis Date   Ankle fracture    Left   GERD (gastroesophageal reflux disease)    Gestational diabetes    Hx of toxoplasmosis    Medical history non-contributory    Past Surgical History:  Procedure Laterality Date   NO PAST SURGERIES     ORIF ANKLE FRACTURE Left 03/03/2022   Procedure: OPEN REDUCTION INTERNAL FIXATION (ORIF) ANKLE FRACTURE;  Surgeon: Teryl Lucy, MD;  Location: [facility] OR;  Service: Orthopedics;  Laterality: Left;   SYNDESMOSIS REPAIR Left 03/03/2022   Procedure: SYNDESMOSIS REPAIR;  Surgeon: Teryl Lucy, MD;  Location: [facility] OR;  Service: Orthopedics;  Laterality: Left;   Patient Active Problem List   Diagnosis Date Noted   History of gestational diabetes 06/13/2017   Obesity (BMI 30-39.9) 03/09/2017   Language barrier 03/09/2017    REFERRING DIAG: left ankle open reduction and internal fixation dos 03/03/22    THERAPY DIAG:  Pain in left ankle and joints of left foot  Other abnormalities of gait and mobility  Localized edema  Muscle weakness (generalized)  Rationale for Evaluation and Treatment Rehabilitation  ONSET DATE: 03/03/22 L ankle ORIF    SUBJECTIVE:    SUBJECTIVE STATEMENT: Pt arrives and states she continues to feel much better, less pain, able to walk more. Arrives with BIL axillary crutches. Felt good after last session,  has been trying to walk around house without AD. Saw surgeon yesterday, felt everything went well.   PERTINENT HISTORY: Unremarkable per chart review, pt states she occasionally has random palpitations but received cardiac work up which was reportedly unremarkable PAIN:  Are you having pain: 0/10 Location/description: around joint line and on top of ankle Best worst over past week: 0-4/10  Per eval -  Best-worst over past week: 0-7/10  - aggravating factors: WB, ankle movement  - Easing factors: rest, elevation   PRECAUTIONS: fall risk   WEIGHT BEARING RESTRICTIONS: No (pt states she is WBAT but has difficulty bearing weight)   FALLS:  Has patient fallen in last 6 months? Yes. Number of falls 1 fall, precipitating episode    LIVING ENVIRONMENT: 1 story 4 STE no rail Has axillary crutches and RW Lives w/ kids (4 years, 7 years, 4 years, 37 years old) and their father    OCCUPATION: [employer] features editor    PLOF: Independent   PATIENT GOALS: less pain, use ankle more    NEXT MD VISIT: July     OBJECTIVE: (objective measures completed at initial evaluation unless otherwise dated)   DIAGNOSTIC FINDINGS:  S/p ORIF L ankle 03/03/22 Pt states she is WBAT    PATIENT SURVEYS:  FOTO 40 current, 53 predicted   COGNITION: Overall cognitive status: Within functional limits for tasks assessed  SENSATION: Denies neuro complaints, appears grossly intact during palpation exam   EDEMA/OBSERVATION:  Mild swelling noted about ankle joint  Pt is noted to still have tape/surgical markings on her foot, significantly dried skin - states she is not sure if she is allowed to remove it or wash foot. Advised to discuss with physician at their appt tomorrow for self care    PALPATION: Tenderness throughout medial and lateral portions of gastroc/soleus more pronounced distally towards achilles tendon.   LOWER EXTREMITY ROM:   Active ROM Right eval Left eval 06/30/22  07/05/22  Hip flexion        Hip extension        Hip abduction        Hip adduction        Hip internal rotation        Hip external rotation        Knee flexion        Knee extension        Ankle dorsiflexion WFL -12 from neutral (passive) -5 from Neutral PROM -5 from Neutral PROM supine  Ankle plantarflexion WFL 30deg (passive)    Ankle inversion   NT    Ankle eversion   NT     (Blank rows = not tested) Comments: no AROM L ankle, compensations noted at toes   LOWER EXTREMITY MMT:   MMT Right eval Left eval  Knee flexion      Knee extension      Ankle dorsiflexion      Ankle plantarflexion      Ankle inversion      Ankle eversion       (Blank rows = not tested) Comments: NT on eval given lack of active movement in ankle joint     FUNCTIONAL TESTS:  Sit to stand: BUE support from raised mat, reduced WB through surgical limb with weight shift towards R, increased time and effort   GAIT: Distance walked: within clinic Assistive device utilized: Crutches Level of assistance: Modified independence Comments: hop to pattern, pt treats surgical limb mostly as NWB with occasional toe touch     TODAY'S TREATMENT:   [facility] Adult PT Treatment:                                                DATE: 07/07/22 Therapeutic Exercise: YTB DF/PF 3x12 each  AP rockerboard PF/DF 3x10 emphasis on reducing toe compensations Manual Therapy: Long sitting; passive physiological movement  L ankle DF/PF to pt tolerance, hallux extension, metatarsal mobs Gait Training: ~66ft no AD, step to pattern, reduced WB, CGA for safety, cues for posture 1 lap around gym SPC RUE, cues for pacing, sequencing, relaxing RUE 1 lap around gym BIL axillary crutches cues for WB and posture Education on [facility] use, sizing, safety w/ household ambulation   [facility] Adult PT Treatment:                                                DATE: 07/05/22 Therapeutic Exercise: Supine DF and PF with yellow band x 20 each  Supine  inversion and eversion isometrics in ball x 20 each  Standing: forward stepping, x 10 each Standing Runners stretch and soleus stretch Standing March at  counter Standing wooden rocker board A/P x 1 minute SLR flexion and abduction 10 x 2 left  Bridge x 10 SL Bridge x 10 each  Figure 4 stretch left  Seated heel raises from edge of rolled towel Seated towel scrunches Manual Therapy: A/P supine ankle mobs, metatarsal mobs, passive ROM all planes of ankle, passive toe flexion and extension  Therapeutic Activity: Gait with Single crutch  Stepping forward and back with each leg- 1 UE at counter,working on weight shift, heel strike and toe off    [facility] Adult PT Treatment:                                                DATE: 06/30/22 Therapeutic Exercise: Towel scrunches  Seated heel raise Self ROM of toes and ankle in figure 4 position Supine DF stretch with strap  Supine hamstring and ITB stretch Piriformis push and pull Seated wooden rocker board A/P rocking Seated heel and toe raises- tactile cues  Standing weight shifting lateral 10 sec x 5  Mini gastric stretch staggered step with mini lunge x 5 for 5 sec  Manual Therapy: Passive ROM toes and ankle, metatarsal mobs  Therapeutic Activity: Gait in clinic with bilat crutch, cues for heel strike and step length.     [facility] Adult PT Treatment:                                                DATE: 06/17/22 Therapeutic Exercise: Seated calf stretch w/ towel 3x 30sec AROM L ankle DF/PF, circles Seated A/P slides for DF ROM Towel scrunches AP and lateral wt shifting in standing c counter Therapeutic Activity: Gait training c crutches for heel/toe pattern. Pt was able to progress to a heel/toe pattern c an improved step length of the R LE, approx 25% of the time, stepping past the L foot. Modalities: Cold pack x10 mins c elevation Self Care: Use of elevation and cold pack                                                                                                                             [facility] Adult PT Treatment:                                                DATE: 06/07/22 Therapeutic Exercise: Seated calf stretch w/ towel x30sec HEP handout and education on safe performance      PATIENT EDUCATION:  Education details: rationale for interventions, safety w/ mobility Person educated: Patient Education method: Explanation, Demonstration, Tactile cues, Verbal cues, and  Handouts Education comprehension: verbalized understanding, returned demonstration, verbal cues required, tactile cues required, and needs further education     HOME EXERCISE PROGRAM: Access Code: [email or web address] URL: [email or web address] Date: 06/17/2022 Prepared by: Joellyn Rued  Exercises - Seated Calf Stretch with Strap  - 2 x daily - 7 x weekly - 1-3 sets - 1-3 reps - 30sec hold - Supine Ankle Dorsiflexion and Plantarflexion AROM  - 2 x daily - 7 x weekly - 2 sets - 10 reps - Supine Ankle Circles  - 2 x daily - 7 x weekly - 2 sets - 10 reps - Seated Heel Slide  - 2 x daily - 7 x weekly - 2 sets - 10 reps - 5 hold - Standing Anterior Posterior Weight Shift with Chair  - 2 x daily - 7 x weekly - 2 sets - 10 reps - 5 hold - Standing Weight Shift Side to Side  - 2 x daily - 7 x weekly - 2 sets - 10 reps - 5 hold   ASSESSMENT:   CLINICAL IMPRESSION: 07/07/2022 Pt arrives w/o pain, continues to endorse steady progress w/ PT. Session is initiated w/ manual for tissue extensibility w/ pt report of reduced stiffness, no pain. Much of session spent with education/practice re: gait training, AD use, mechanics. Pt does well with SPC, encouragement is provided to acquire one for use in household rather than using no AD, although continue to recommend axillary crutches for community navigation. No adverse events, no pain on departure, reduced stiffness. Recommend continuing along current POC in order to address relevant deficits and improve functional  tolerance. Pt departs today's session in no acute distress, all voiced questions/concerns addressed appropriately from PT perspective.        OBJECTIVE IMPAIRMENTS: Abnormal gait, decreased activity tolerance, decreased balance, decreased endurance, decreased mobility, difficulty walking, decreased ROM, decreased strength, and pain.    ACTIVITY LIMITATIONS: carrying, lifting, bending, standing, squatting, stairs, transfers, and locomotion level   PARTICIPATION LIMITATIONS: meal prep, cleaning, laundry, shopping, and community activity   PERSONAL FACTORS: Time since onset of injury/illness/exacerbation are also affecting patient's functional outcome.    REHAB POTENTIAL: Good   CLINICAL DECISION MAKING: Stable/uncomplicated   EVALUATION COMPLEXITY: Low     GOALS: Goals reviewed with patient? No   SHORT TERM GOALS: Target date: 07/05/2022 Pt will demonstrate appropriate understanding and performance of initially prescribed HEP in order to facilitate improved independence with management of symptoms.  Baseline: HEP provided on eval Goal status: MET   2. Pt will score greater than or equal to 52 on FOTO in order to demonstrate improved perception of function due to symptoms.            Baseline: 40            Goal status: ONGOING  LONG TERM GOALS: Target date: 08/02/2022 Pt will score 67 or greater on FOTO in order to demonstrate improved perception of function due to symptoms.  Baseline: 40 Goal status: INITIAL   2.  Pt will demonstrate at least 0 degrees of ankle DF AROM in order to facilitate improved tolerance to functional movements such as walking/stairs.  Baseline: see ROM chart above Goal status: INITIAL   3.  Pt will be able to ambulate at least 553ft with LRAD and appropriate gait mechanics with less than 2pt increase in pain on NPS in order to promote improved independence w/ household navigation. Baseline: axillary crutches, treats surgical limb as NWB Goal status:  INITIAL   4.  Pt will be able to perform 5 consecutive sit to stands without UE support, grossly symmetrical WB, less than 2 pt increase in pain for improved safety w/ transfers. Baseline: 1 repetition, altered mechanics and B UE support Goal status: INITIAL    5. Pt will demonstrate appropriate performance of final prescribed HEP in order to facilitate improved self-management of symptoms post-discharge.             Baseline: initial HEP prescribed            Goal status: INITIAL     6. Pt will report ability to perform lower body dressing without assist for improved independence w/ ADLs.            Baseline: son assists w/ donning sock during session due to difficulty                    Goal status: INITIAL     PLAN:   PT FREQUENCY: 2x/week   PT DURATION: 8 weeks   PLANNED INTERVENTIONS: Therapeutic exercises, Therapeutic activity, Neuromuscular re-education, Balance training, Gait training, Patient/Family education, Self Care, Joint mobilization, Stair training, Aquatic Therapy, Dry Needling, Electrical stimulation, Cryotherapy, Moist heat, Taping, Vasopneumatic device, Manual therapy, and Re-evaluation   PLAN FOR NEXT SESSION: review/update HEP, emphasis on passive ankle mobility. WB and gait mechanics. [facility] practice in clinic   Ashley Murrain PT, DPT 07/07/2022 9:33 AM

## 2022-07-13 ENCOUNTER — Ambulatory Visit: Payer: Self-pay | Admitting: Physical Therapy

## 2022-07-13 ENCOUNTER — Encounter: Payer: Self-pay | Admitting: Physical Therapy

## 2022-07-13 DIAGNOSIS — R2689 Other abnormalities of gait and mobility: Secondary | ICD-10-CM

## 2022-07-13 DIAGNOSIS — R600 Localized edema: Secondary | ICD-10-CM

## 2022-07-13 DIAGNOSIS — M25572 Pain in left ankle and joints of left foot: Secondary | ICD-10-CM

## 2022-07-13 NOTE — Therapy (Signed)
OUTPATIENT PHYSICAL THERAPY TREATMENT NOTE   Patient Name: Joanna Whitney MRN: [id number] DOB:17-Dec-1980, 42 y.o., female Today's Date: 07/13/2022  PCP: No PCP in chart   REFERRING PROVIDER: Teryl Lucy, MD   END OF SESSION:   PT End of Session - 07/13/22 0802     Visit Number 6    Number of Visits 17    Date for PT Re-Evaluation 08/02/22    PT Start Time 0802    PT Stop Time 0845    PT Time Calculation (min) 43 min              Past Medical History:  Diagnosis Date   Ankle fracture    Left   GERD (gastroesophageal reflux disease)    Gestational diabetes    Hx of toxoplasmosis    Medical history non-contributory    Past Surgical History:  Procedure Laterality Date   NO PAST SURGERIES     ORIF ANKLE FRACTURE Left 03/03/2022   Procedure: OPEN REDUCTION INTERNAL FIXATION (ORIF) ANKLE FRACTURE;  Surgeon: Teryl Lucy, MD;  Location: [facility] OR;  Service: Orthopedics;  Laterality: Left;   SYNDESMOSIS REPAIR Left 03/03/2022   Procedure: SYNDESMOSIS REPAIR;  Surgeon: Teryl Lucy, MD;  Location: [facility] OR;  Service: Orthopedics;  Laterality: Left;   Patient Active Problem List   Diagnosis Date Noted   History of gestational diabetes 06/13/2017   Obesity (BMI 30-39.9) 03/09/2017   Language barrier 03/09/2017    REFERRING DIAG: left ankle open reduction and internal fixation dos 03/03/22    THERAPY DIAG:  Pain in left ankle and joints of left foot  Other abnormalities of gait and mobility  Localized edema  Rationale for Evaluation and Treatment Rehabilitation  ONSET DATE: 03/03/22 L ankle ORIF    SUBJECTIVE:    SUBJECTIVE STATEMENT: Pain is improving with weight bearing. I am using one crutch or cane more often. This morning I am having pain on the bottom of my foot.     PERTINENT HISTORY: Unremarkable per chart review, pt states she occasionally has random palpitations but received cardiac work up which was reportedly unremarkable PAIN:  Are you  having pain: 0/10 Location/description: around joint line and on top of ankle Best worst over past week: 0-4/10  Per eval -  Best-worst over past week: 0-7/10  - aggravating factors: WB, ankle movement  - Easing factors: rest, elevation   PRECAUTIONS: fall risk   WEIGHT BEARING RESTRICTIONS: No (pt states she is WBAT but has difficulty bearing weight)   FALLS:  Has patient fallen in last 6 months? Yes. Number of falls 1 fall, precipitating episode    LIVING ENVIRONMENT: 1 story 4 STE no rail Has axillary crutches and RW Lives w/ kids (4 years, 7 years, 2 years, 64 years old) and their father    OCCUPATION: [employer] features editor    PLOF: Independent   PATIENT GOALS: less pain, use ankle more    NEXT MD VISIT: July     OBJECTIVE: (objective measures completed at initial evaluation unless otherwise dated)   DIAGNOSTIC FINDINGS:  S/p ORIF L ankle 03/03/22 Pt states she is WBAT    PATIENT SURVEYS:  FOTO 40 current, 67 predicted 07/13/22: FOTO 57    COGNITION: Overall cognitive status: Within functional limits for tasks assessed                         SENSATION: Denies neuro complaints, appears grossly intact during palpation exam   EDEMA/OBSERVATION:  Mild swelling  noted about ankle joint  Pt is noted to still have tape/surgical markings on her foot, significantly dried skin - states she is not sure if she is allowed to remove it or wash foot. Advised to discuss with physician at their appt tomorrow for self care    PALPATION: Tenderness throughout medial and lateral portions of gastroc/soleus more pronounced distally towards achilles tendon.   LOWER EXTREMITY ROM:   Active ROM Right eval Left eval 06/30/22 07/05/22 07/13/22  Hip flexion         Hip extension         Hip abduction         Hip adduction         Hip internal rotation         Hip external rotation         Knee flexion         Knee extension         Ankle dorsiflexion WFL -12 from neutral (passive) -5  from Neutral PROM -5 from Neutral PROM supine -5 from neutral PROM supine   Ankle plantarflexion WFL 30deg (passive)     Ankle inversion   NT     Ankle eversion   NT      (Blank rows = not tested) Comments: no AROM L ankle, compensations noted at toes   LOWER EXTREMITY MMT:   MMT Right eval Left eval  Knee flexion      Knee extension      Ankle dorsiflexion      Ankle plantarflexion      Ankle inversion      Ankle eversion       (Blank rows = not tested) Comments: NT on eval given lack of active movement in ankle joint     FUNCTIONAL TESTS:  Sit to stand: BUE support from raised mat, reduced WB through surgical limb with weight shift towards R, increased time and effort 6819/24: 3-4 sec best Left   GAIT: Distance walked: within clinic Assistive device utilized: Crutches Level of assistance: Modified independence Comments: hop to pattern, pt treats surgical limb mostly as NWB with occasional toe touch     TODAY'S TREATMENT:   [facility] Adult PT Treatment:                                                DATE: 07/13/22 Therapeutic Exercise: Haematologist - cues to avoid compensations Heel raises front edge of slant board x 10 Mini squats 10 x 2  Soleus stretch 5 sec x 10 Tandem stance 30 sec  SLS 3-4 sec best  YTB DF x 20  Rec bike x 3 minutes L1  Manual Therapy: Passive ankle DF, PF, toe flexion and ext STW to plantar fascia Metatarsal mobs     [facility] Adult PT Treatment:                                                DATE: 07/07/22 Therapeutic Exercise: YTB DF/PF 3x12 each  AP rockerboard PF/DF 3x10 emphasis on reducing toe compensations Manual Therapy: Long sitting; passive physiological movement  L ankle DF/PF to pt tolerance, hallux extension, metatarsal mobs Gait Training: ~57ft no AD, step to pattern, reduced WB, CGA  for safety, cues for posture 1 lap around gym SPC RUE, cues for pacing, sequencing, relaxing RUE 1 lap around gym BIL axillary crutches cues  for WB and posture Education on [facility] use, sizing, safety w/ household ambulation   [facility] Adult PT Treatment:                                                DATE: 07/05/22 Therapeutic Exercise: Supine DF and PF with yellow band x 20 each  Supine inversion and eversion isometrics in ball x 20 each  Standing: forward stepping, x 10 each Standing Runners stretch and soleus stretch Standing March at counter Standing wooden rocker board A/P x 1 minute SLR flexion and abduction 10 x 2 left  Bridge x 10 SL Bridge x 10 each  Figure 4 stretch left  Seated heel raises from edge of rolled towel Seated towel scrunches Manual Therapy: A/P supine ankle mobs, metatarsal mobs, passive ROM all planes of ankle, passive toe flexion and extension  Therapeutic Activity: Gait with Single crutch  Stepping forward and back with each leg- 1 UE at counter,working on weight shift, heel strike and toe off    [facility] Adult PT Treatment:                                                DATE: 06/30/22 Therapeutic Exercise: Towel scrunches  Seated heel raise Self ROM of toes and ankle in figure 4 position Supine DF stretch with strap  Supine hamstring and ITB stretch Piriformis push and pull Seated wooden rocker board A/P rocking Seated heel and toe raises- tactile cues  Standing weight shifting lateral 10 sec x 5  Mini gastric stretch staggered step with mini lunge x 5 for 5 sec  Manual Therapy: Passive ROM toes and ankle, metatarsal mobs  Therapeutic Activity: Gait in clinic with bilat crutch, cues for heel strike and step length.     [facility] Adult PT Treatment:                                                DATE: 06/17/22 Therapeutic Exercise: Seated calf stretch w/ towel 3x 30sec AROM L ankle DF/PF, circles Seated A/P slides for DF ROM Towel scrunches AP and lateral wt shifting in standing c counter Therapeutic Activity: Gait training c crutches for heel/toe pattern. Pt was able to progress to a heel/toe  pattern c an improved step length of the R LE, approx 25% of the time, stepping past the L foot. Modalities: Cold pack x10 mins c elevation Self Care: Use of elevation and cold pack  [facility] Adult PT Treatment:                                                DATE: 06/07/22 Therapeutic Exercise: Seated calf stretch w/ towel x30sec HEP handout and education on safe performance      PATIENT EDUCATION:  Education details: rationale for interventions, safety w/ mobility Person educated: Patient Education method: Explanation, Demonstration, Tactile cues, Verbal cues, and Handouts Education comprehension: verbalized understanding, returned demonstration, verbal cues required, tactile cues required, and needs further education     HOME EXERCISE PROGRAM: Access Code: [email or web address] URL: [email or web address] Date: 06/17/2022 Prepared by: Joellyn Rued  Exercises - Seated Calf Stretch with Strap  - 2 x daily - 7 x weekly - 1-3 sets - 1-3 reps - 30sec hold - Supine Ankle Dorsiflexion and Plantarflexion AROM  - 2 x daily - 7 x weekly - 2 sets - 10 reps - Supine Ankle Circles  - 2 x daily - 7 x weekly - 2 sets - 10 reps - Seated Heel Slide  - 2 x daily - 7 x weekly - 2 sets - 10 reps - 5 hold - Standing Anterior Posterior Weight Shift with Chair  - 2 x daily - 7 x weekly - 2 sets - 10 reps - 5 hold - Standing Weight Shift Side to Side  - 2 x daily - 7 x weekly - 2 sets - 10 reps - 5 hold   ASSESSMENT:   CLINICAL IMPRESSION: 07/13/2022 Pt arrives w/ min plantar fascia pain, continues to endorse steady progress w/ PT. She reports most days are pain free. She is ambulating more with singe UE AD. She has questions regarding walking and indoor bike. He did well on clinic bike and was encouraged to use hers at home. Also encouraged her to walk short distances as tolerated and  assess response. Ankle Rom continues to be limited,lacking DF. Continued with closed chain ankle mobility exercise and began balance. SLS 3-4 sec at best on LLE. FOTO score improved.  Updated HEP.       OBJECTIVE IMPAIRMENTS: Abnormal gait, decreased activity tolerance, decreased balance, decreased endurance, decreased mobility, difficulty walking, decreased ROM, decreased strength, and pain.    ACTIVITY LIMITATIONS: carrying, lifting, bending, standing, squatting, stairs, transfers, and locomotion level   PARTICIPATION LIMITATIONS: meal prep, cleaning, laundry, shopping, and community activity   PERSONAL FACTORS: Time since onset of injury/illness/exacerbation are also affecting patient's functional outcome.    REHAB POTENTIAL: Good   CLINICAL DECISION MAKING: Stable/uncomplicated   EVALUATION COMPLEXITY: Low     GOALS: Goals reviewed with patient? No   SHORT TERM GOALS: Target date: 07/05/2022 Pt will demonstrate appropriate understanding and performance of initially prescribed HEP in order to facilitate improved independence with management of symptoms.  Baseline: HEP provided on eval Goal status: MET   2. Pt will score greater than or equal to 52 on FOTO in order to demonstrate improved perception of function due to symptoms.            Baseline: 40 07/13/22: 57             Goal status: MET  LONG TERM GOALS: Target date: 08/02/2022 Pt will score 67 or greater on FOTO in order to demonstrate improved perception of function due to symptoms.  Baseline: 40 07/13/22: 57 Goal status: ONGOING   2.  Pt will demonstrate at least 0 degrees of ankle DF AROM in order to facilitate improved tolerance to functional movements such as walking/stairs.  Baseline: see ROM chart above 07/13/22: -5 Goal status: ONGOING   3.  Pt will be able to ambulate at least 572ft with LRAD and appropriate gait mechanics with less than 2pt increase in pain on NPS in order to promote improved independence w/  household navigation. Baseline: axillary crutches, treats surgical limb as NWB 07/13/22: ambulating household distances single UE AD Goal status: ONGOING   4.  Pt will be able to perform 5 consecutive sit to stands without UE support, grossly symmetrical WB, less than 2 pt increase in pain for improved safety w/ transfers. Baseline: 1 repetition, altered mechanics and B UE support Goal status: INITIAL    5. Pt will demonstrate appropriate performance of final prescribed HEP in order to facilitate improved self-management of symptoms post-discharge.             Baseline: initial HEP prescribed            Goal status: INITIAL     6. Pt will report ability to perform lower body dressing without assist for improved independence w/ ADLs.            Baseline: son assists w/ donning sock during session due to difficulty                    Goal status: INITIAL     PLAN:   PT FREQUENCY: 2x/week   PT DURATION: 8 weeks   PLANNED INTERVENTIONS: Therapeutic exercises, Therapeutic activity, Neuromuscular re-education, Balance training, Gait training, Patient/Family education, Self Care, Joint mobilization, Stair training, Aquatic Therapy, Dry Needling, Electrical stimulation, Cryotherapy, Moist heat, Taping, Vasopneumatic device, Manual therapy, and Re-evaluation   PLAN FOR NEXT SESSION: review/update HEP, emphasis on passive ankle mobility. WB and gait mechanics. [facility] practice in clinic , assess LTGs  Jannette Spanner, PTA 07/13/22 9:08 AM Phone: [phone] Fax: [phone]

## 2022-07-15 ENCOUNTER — Ambulatory Visit: Payer: Self-pay | Admitting: Physical Therapy

## 2022-07-15 ENCOUNTER — Encounter: Payer: Self-pay | Admitting: Physical Therapy

## 2022-07-15 DIAGNOSIS — M25572 Pain in left ankle and joints of left foot: Secondary | ICD-10-CM

## 2022-07-15 DIAGNOSIS — R2689 Other abnormalities of gait and mobility: Secondary | ICD-10-CM

## 2022-07-15 NOTE — Therapy (Signed)
OUTPATIENT PHYSICAL THERAPY TREATMENT NOTE   Patient Name: Joanna Whitney MRN: [id number] DOB:Oct 05, 1980, 42 y.o., female Today's Date: 07/15/2022  PCP: No PCP in chart   REFERRING PROVIDER: Teryl Lucy, MD   END OF SESSION:   PT End of Session - 07/15/22 0805     Visit Number 7    Number of Visits 17    Date for PT Re-Evaluation 08/02/22    Authorization Type none    PT Start Time 0804    PT Stop Time 0848    PT Time Calculation (min) 44 min              Past Medical History:  Diagnosis Date   Ankle fracture    Left   GERD (gastroesophageal reflux disease)    Gestational diabetes    Hx of toxoplasmosis    Medical history non-contributory    Past Surgical History:  Procedure Laterality Date   NO PAST SURGERIES     ORIF ANKLE FRACTURE Left 03/03/2022   Procedure: OPEN REDUCTION INTERNAL FIXATION (ORIF) ANKLE FRACTURE;  Surgeon: Teryl Lucy, MD;  Location: [facility] OR;  Service: Orthopedics;  Laterality: Left;   SYNDESMOSIS REPAIR Left 03/03/2022   Procedure: SYNDESMOSIS REPAIR;  Surgeon: Teryl Lucy, MD;  Location: [facility] OR;  Service: Orthopedics;  Laterality: Left;   Patient Active Problem List   Diagnosis Date Noted   History of gestational diabetes 06/13/2017   Obesity (BMI 30-39.9) 03/09/2017   Language barrier 03/09/2017    REFERRING DIAG: left ankle open reduction and internal fixation dos 03/03/22    THERAPY DIAG:  Pain in left ankle and joints of left foot  Other abnormalities of gait and mobility  Rationale for Evaluation and Treatment Rehabilitation  ONSET DATE: 03/03/22 L ankle ORIF    SUBJECTIVE:    SUBJECTIVE STATEMENT: No pain today. I rode my bike at home.      PERTINENT HISTORY: Unremarkable per chart review, pt states she occasionally has random palpitations but received cardiac work up which was reportedly unremarkable PAIN:  Are you having pain: 0/10 Location/description: around joint line and on top of ankle Best worst  over past week: 0-4/10  Per eval -  Best-worst over past week: 0-7/10  - aggravating factors: WB, ankle movement  - Easing factors: rest, elevation   PRECAUTIONS: fall risk   WEIGHT BEARING RESTRICTIONS: No (pt states she is WBAT but has difficulty bearing weight)   FALLS:  Has patient fallen in last 6 months? Yes. Number of falls 1 fall, precipitating episode    LIVING ENVIRONMENT: 1 story 4 STE no rail Has axillary crutches and RW Lives w/ kids (4 years, 7 years, 64 years, 106 years old) and their father    OCCUPATION: [employer] features editor    PLOF: Independent   PATIENT GOALS: less pain, use ankle more    NEXT MD VISIT: July     OBJECTIVE: (objective measures completed at initial evaluation unless otherwise dated)   DIAGNOSTIC FINDINGS:  S/p ORIF L ankle 03/03/22 Pt states she is WBAT    PATIENT SURVEYS:  FOTO 40 current, 67 predicted 07/13/22: FOTO 57    COGNITION: Overall cognitive status: Within functional limits for tasks assessed                         SENSATION: Denies neuro complaints, appears grossly intact during palpation exam   EDEMA/OBSERVATION:  Mild swelling noted about ankle joint  Pt is noted to still have tape/surgical markings on  her foot, significantly dried skin - states she is not sure if she is allowed to remove it or wash foot. Advised to discuss with physician at their appt tomorrow for self care    PALPATION: Tenderness throughout medial and lateral portions of gastroc/soleus more pronounced distally towards achilles tendon.   LOWER EXTREMITY ROM:   Active ROM Right eval Left eval 06/30/22 07/05/22 07/13/22  Hip flexion         Hip extension         Hip abduction         Hip adduction         Hip internal rotation         Hip external rotation         Knee flexion         Knee extension         Ankle dorsiflexion WFL -12 from neutral (passive) -5 from Neutral PROM -5 from Neutral PROM supine -5 from neutral PROM supine   Ankle  plantarflexion WFL 30deg (passive)     Ankle inversion   NT     Ankle eversion   NT      (Blank rows = not tested) Comments: no AROM L ankle, compensations noted at toes   LOWER EXTREMITY MMT:   MMT Right eval Left eval  Knee flexion      Knee extension      Ankle dorsiflexion      Ankle plantarflexion      Ankle inversion      Ankle eversion       (Blank rows = not tested) Comments: NT on eval given lack of active movement in ankle joint     FUNCTIONAL TESTS:  Sit to stand: BUE support from raised mat, reduced WB through surgical limb with weight shift towards R, increased time and effort 07/13/22: 3-4 sec best Left 07/15/22: 2 MWT: 310 feet with 1 crutch   GAIT: Distance walked: within clinic Assistive device utilized: Crutches Level of assistance: Modified independence Comments: hop to pattern, pt treats surgical limb mostly as NWB with occasional toe touch     TODAY'S TREATMENT:   [facility] Adult PT Treatment:                                                DATE: 07/15/22 Therapeutic Exercise: Heel raises edge of l2 inch step Gastroc and soleus stretches closed chain Tandem on [employer] trials each way  Manual Therapy: DF mobs supine and prone  Mobs with movement using step and therapist over pressure.   Therapeutic Activity: 2 MWT    [facility] Adult PT Treatment:                                                DATE: 07/13/22 Therapeutic Exercise: Haematologist - cues to avoid compensations Heel raises front edge of slant board x 10 Mini squats 10 x 2  Soleus stretch 5 sec x 10 Tandem stance 30 sec  SLS 3-4 sec best  YTB DF x 20  Rec bike x 3 minutes L1  Manual Therapy: Passive ankle DF, PF, toe flexion and ext STW to plantar fascia Metatarsal mobs     [facility] Adult PT  Treatment:                                                DATE: 07/07/22 Therapeutic Exercise: YTB DF/PF 3x12 each  AP rockerboard PF/DF 3x10 emphasis on reducing toe compensations Manual  Therapy: Long sitting; passive physiological movement  L ankle DF/PF to pt tolerance, hallux extension, metatarsal mobs Gait Training: ~62ft no AD, step to pattern, reduced WB, CGA for safety, cues for posture 1 lap around gym SPC RUE, cues for pacing, sequencing, relaxing RUE 1 lap around gym BIL axillary crutches cues for WB and posture Education on [facility] use, sizing, safety w/ household ambulation   [facility] Adult PT Treatment:                                                DATE: 07/05/22 Therapeutic Exercise: Supine DF and PF with yellow band x 20 each  Supine inversion and eversion isometrics in ball x 20 each  Standing: forward stepping, x 10 each Standing Runners stretch and soleus stretch Standing March at counter Standing wooden rocker board A/P x 1 minute SLR flexion and abduction 10 x 2 left  Bridge x 10 SL Bridge x 10 each  Figure 4 stretch left  Seated heel raises from edge of rolled towel Seated towel scrunches Manual Therapy: A/P supine ankle mobs, metatarsal mobs, passive ROM all planes of ankle, passive toe flexion and extension  Therapeutic Activity: Gait with Single crutch  Stepping forward and back with each leg- 1 UE at counter,working on weight shift, heel strike and toe off    [facility] Adult PT Treatment:                                                DATE: 06/30/22 Therapeutic Exercise: Towel scrunches  Seated heel raise Self ROM of toes and ankle in figure 4 position Supine DF stretch with strap  Supine hamstring and ITB stretch Piriformis push and pull Seated wooden rocker board A/P rocking Seated heel and toe raises- tactile cues  Standing weight shifting lateral 10 sec x 5  Mini gastric stretch staggered step with mini lunge x 5 for 5 sec  Manual Therapy: Passive ROM toes and ankle, metatarsal mobs  Therapeutic Activity: Gait in clinic with bilat crutch, cues for heel strike and step length.     [facility] Adult PT Treatment:                                                 DATE: 06/17/22 Therapeutic Exercise: Seated calf stretch w/ towel 3x 30sec AROM L ankle DF/PF, circles Seated A/P slides for DF ROM Towel scrunches AP and lateral wt shifting in standing c counter Therapeutic Activity: Gait training c crutches for heel/toe pattern. Pt was able to progress to a heel/toe pattern c an improved step length of the R LE, approx 25% of the time, stepping past the L foot. Modalities: Cold pack x10 mins c  elevation Self Care: Use of elevation and cold pack                                                                                                                            [facility] Adult PT Treatment:                                                DATE: 06/07/22 Therapeutic Exercise: Seated calf stretch w/ towel x30sec HEP handout and education on safe performance      PATIENT EDUCATION:  Education details: rationale for interventions, safety w/ mobility Person educated: Patient Education method: Explanation, Demonstration, Tactile cues, Verbal cues, and Handouts Education comprehension: verbalized understanding, returned demonstration, verbal cues required, tactile cues required, and needs further education     HOME EXERCISE PROGRAM: Access Code: [email or web address] URL: [email or web address] Date: 06/17/2022 Prepared by: Joellyn Rued  Exercises - Seated Calf Stretch with Strap  - 2 x daily - 7 x weekly - 1-3 sets - 1-3 reps - 30sec hold - Supine Ankle Dorsiflexion and Plantarflexion AROM  - 2 x daily - 7 x weekly - 2 sets - 10 reps - Supine Ankle Circles  - 2 x daily - 7 x weekly - 2 sets - 10 reps - Seated Heel Slide  - 2 x daily - 7 x weekly - 2 sets - 10 reps - 5 hold - Standing Anterior Posterior Weight Shift with Chair  - 2 x daily - 7 x weekly - 2 sets - 10 reps - 5 hold - Standing Weight Shift Side to Side  - 2 x daily - 7 x weekly - 2 sets - 10 reps - 5 hold   ASSESSMENT:   CLINICAL IMPRESSION: 07/15/2022 Pt arrives w/ out  pain, continues to endorse steady progress w/ PT, walking more and riding her indoor bike. Captured 2 MWT with single crutch. Continues to lack DF. Most of session spent with manual mobs to increased DF and DF mobs with movement. She felt reduced stiffness in ankle afterward and had visually improved DF afterward. Will continue with increased manual techniques and mobs with movement at future sessions if beneficial.        OBJECTIVE IMPAIRMENTS: Abnormal gait, decreased activity tolerance, decreased balance, decreased endurance, decreased mobility, difficulty walking, decreased ROM, decreased strength, and pain.    ACTIVITY LIMITATIONS: carrying, lifting, bending, standing, squatting, stairs, transfers, and locomotion level   PARTICIPATION LIMITATIONS: meal prep, cleaning, laundry, shopping, and community activity   PERSONAL FACTORS: Time since onset of injury/illness/exacerbation are also affecting patient's functional outcome.    REHAB POTENTIAL: Good   CLINICAL DECISION MAKING: Stable/uncomplicated   EVALUATION COMPLEXITY: Low     GOALS: Goals reviewed with patient? No   SHORT TERM GOALS: Target date: 07/05/2022 Pt will demonstrate appropriate understanding and performance of initially prescribed HEP  in order to facilitate improved independence with management of symptoms.  Baseline: HEP provided on eval Goal status: MET   2. Pt will score greater than or equal to 52 on FOTO in order to demonstrate improved perception of function due to symptoms.            Baseline: 40 07/13/22: 57             Goal status: MET  LONG TERM GOALS: Target date: 08/02/2022 Pt will score 67 or greater on FOTO in order to demonstrate improved perception of function due to symptoms.  Baseline: 40 07/13/22: 57 Goal status: ONGOING   2.  Pt will demonstrate at least 0 degrees of ankle DF AROM in order to facilitate improved tolerance to functional movements such as walking/stairs.  Baseline: see ROM  chart above 07/13/22: -5 Goal status: ONGOING   3.  Pt will be able to ambulate at least 566ft with LRAD and appropriate gait mechanics with less than 2pt increase in pain on NPS in order to promote improved independence w/ household navigation. Baseline: axillary crutches, treats surgical limb as NWB 07/13/22: ambulating household distances single UE AD Goal status: ONGOING   4.  Pt will be able to perform 5 consecutive sit to stands without UE support, grossly symmetrical WB, less than 2 pt increase in pain for improved safety w/ transfers. Baseline: 1 repetition, altered mechanics and B UE support Goal status: INITIAL    5. Pt will demonstrate appropriate performance of final prescribed HEP in order to facilitate improved self-management of symptoms post-discharge.             Baseline: initial HEP prescribed            Goal status: INITIAL     6. Pt will report ability to perform lower body dressing without assist for improved independence w/ ADLs.            Baseline: son assists w/ donning sock during session due to difficulty                    Goal status: INITIAL     PLAN:   PT FREQUENCY: 2x/week   PT DURATION: 8 weeks   PLANNED INTERVENTIONS: Therapeutic exercises, Therapeutic activity, Neuromuscular re-education, Balance training, Gait training, Patient/Family education, Self Care, Joint mobilization, Stair training, Aquatic Therapy, Dry Needling, Electrical stimulation, Cryotherapy, Moist heat, Taping, Vasopneumatic device, Manual therapy, and Re-evaluation   PLAN FOR NEXT SESSION: review/update HEP, emphasis on passive ankle mobility. WB and gait mechanics. [facility] practice in clinic , assess LTGs  Jannette Spanner, PTA 07/15/22 8:53 AM Phone: [phone] Fax: [phone]

## 2022-07-17 IMAGING — DX DG CHEST 2V
2 series · 2 of 2 positions shown · non-contrast
Comparison: 04/15/2020

CLINICAL DATA: Chest pain

EXAM:
CHEST - 2 VIEW

[chest pa]
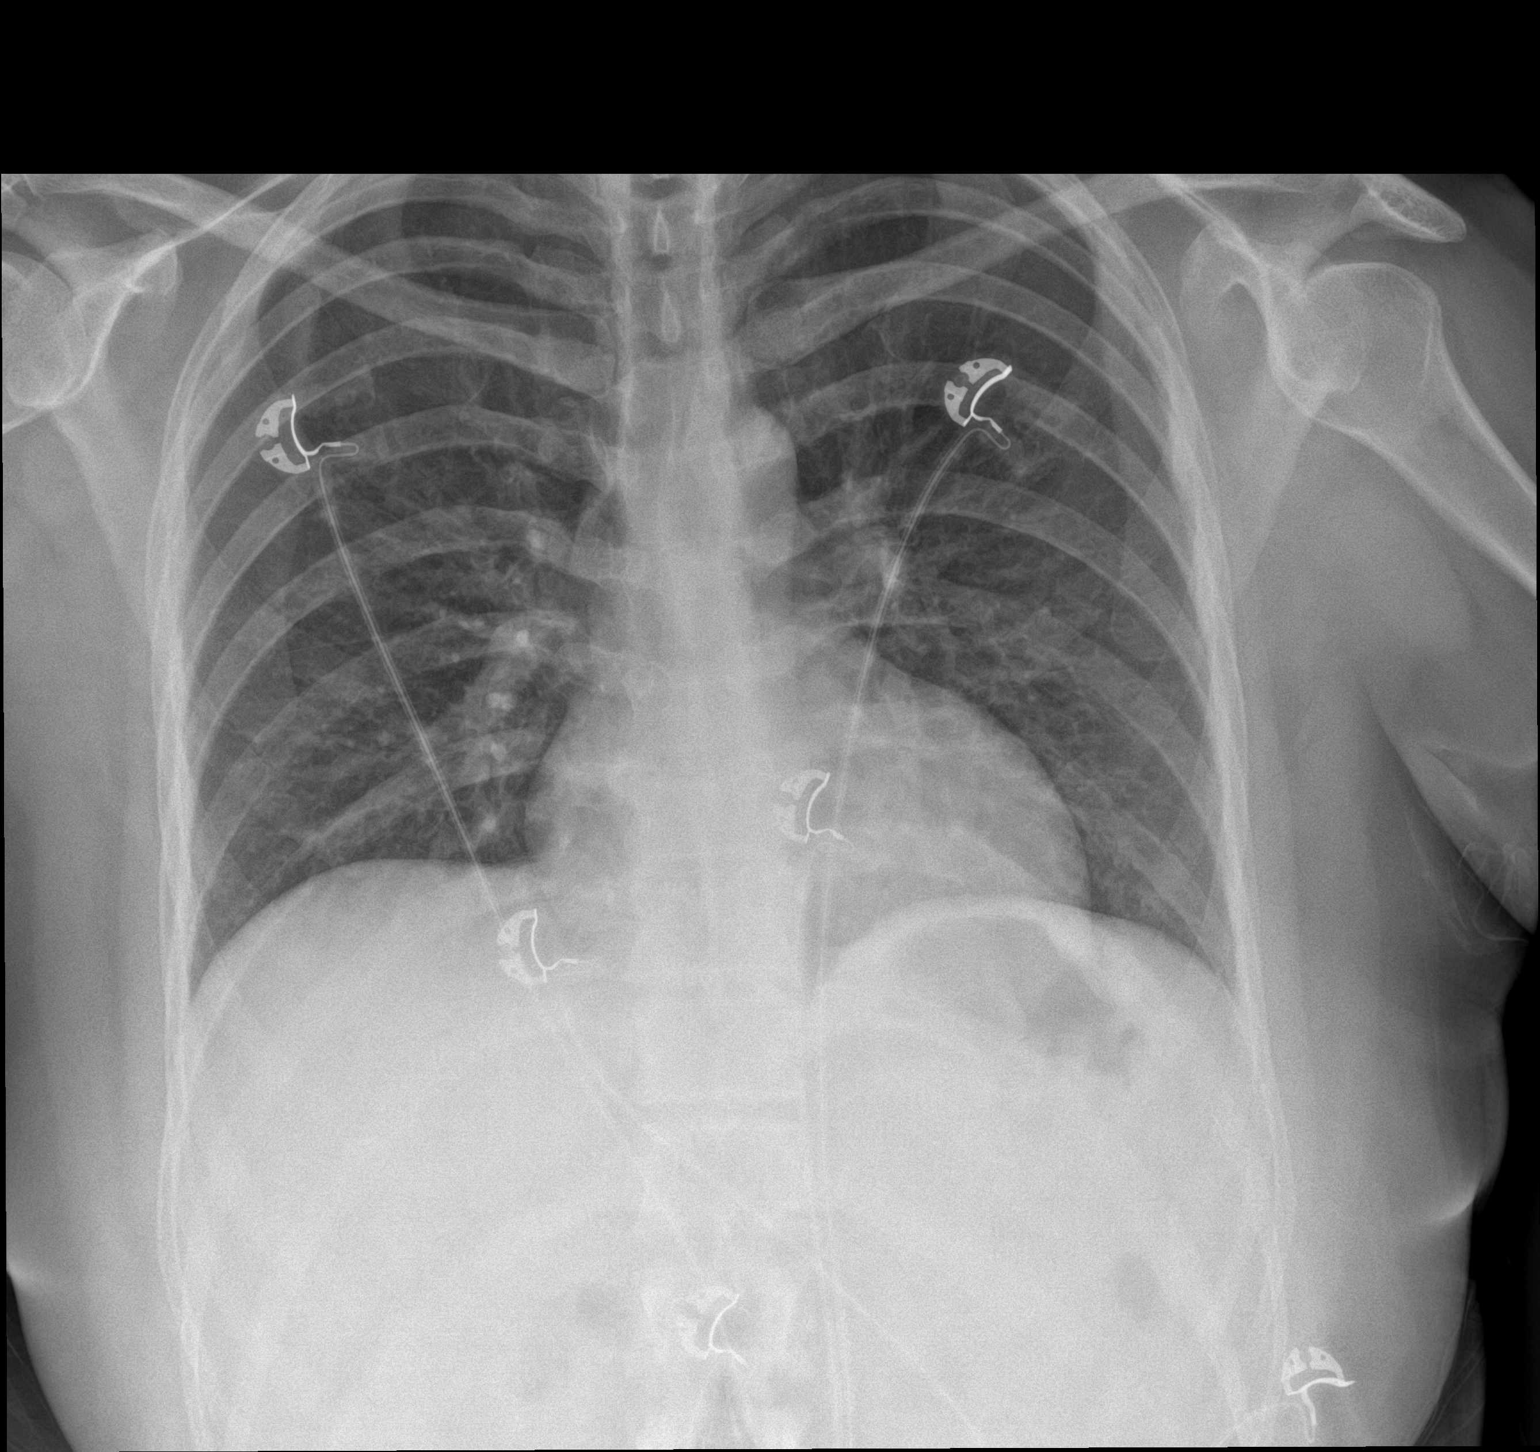

[chest lat]
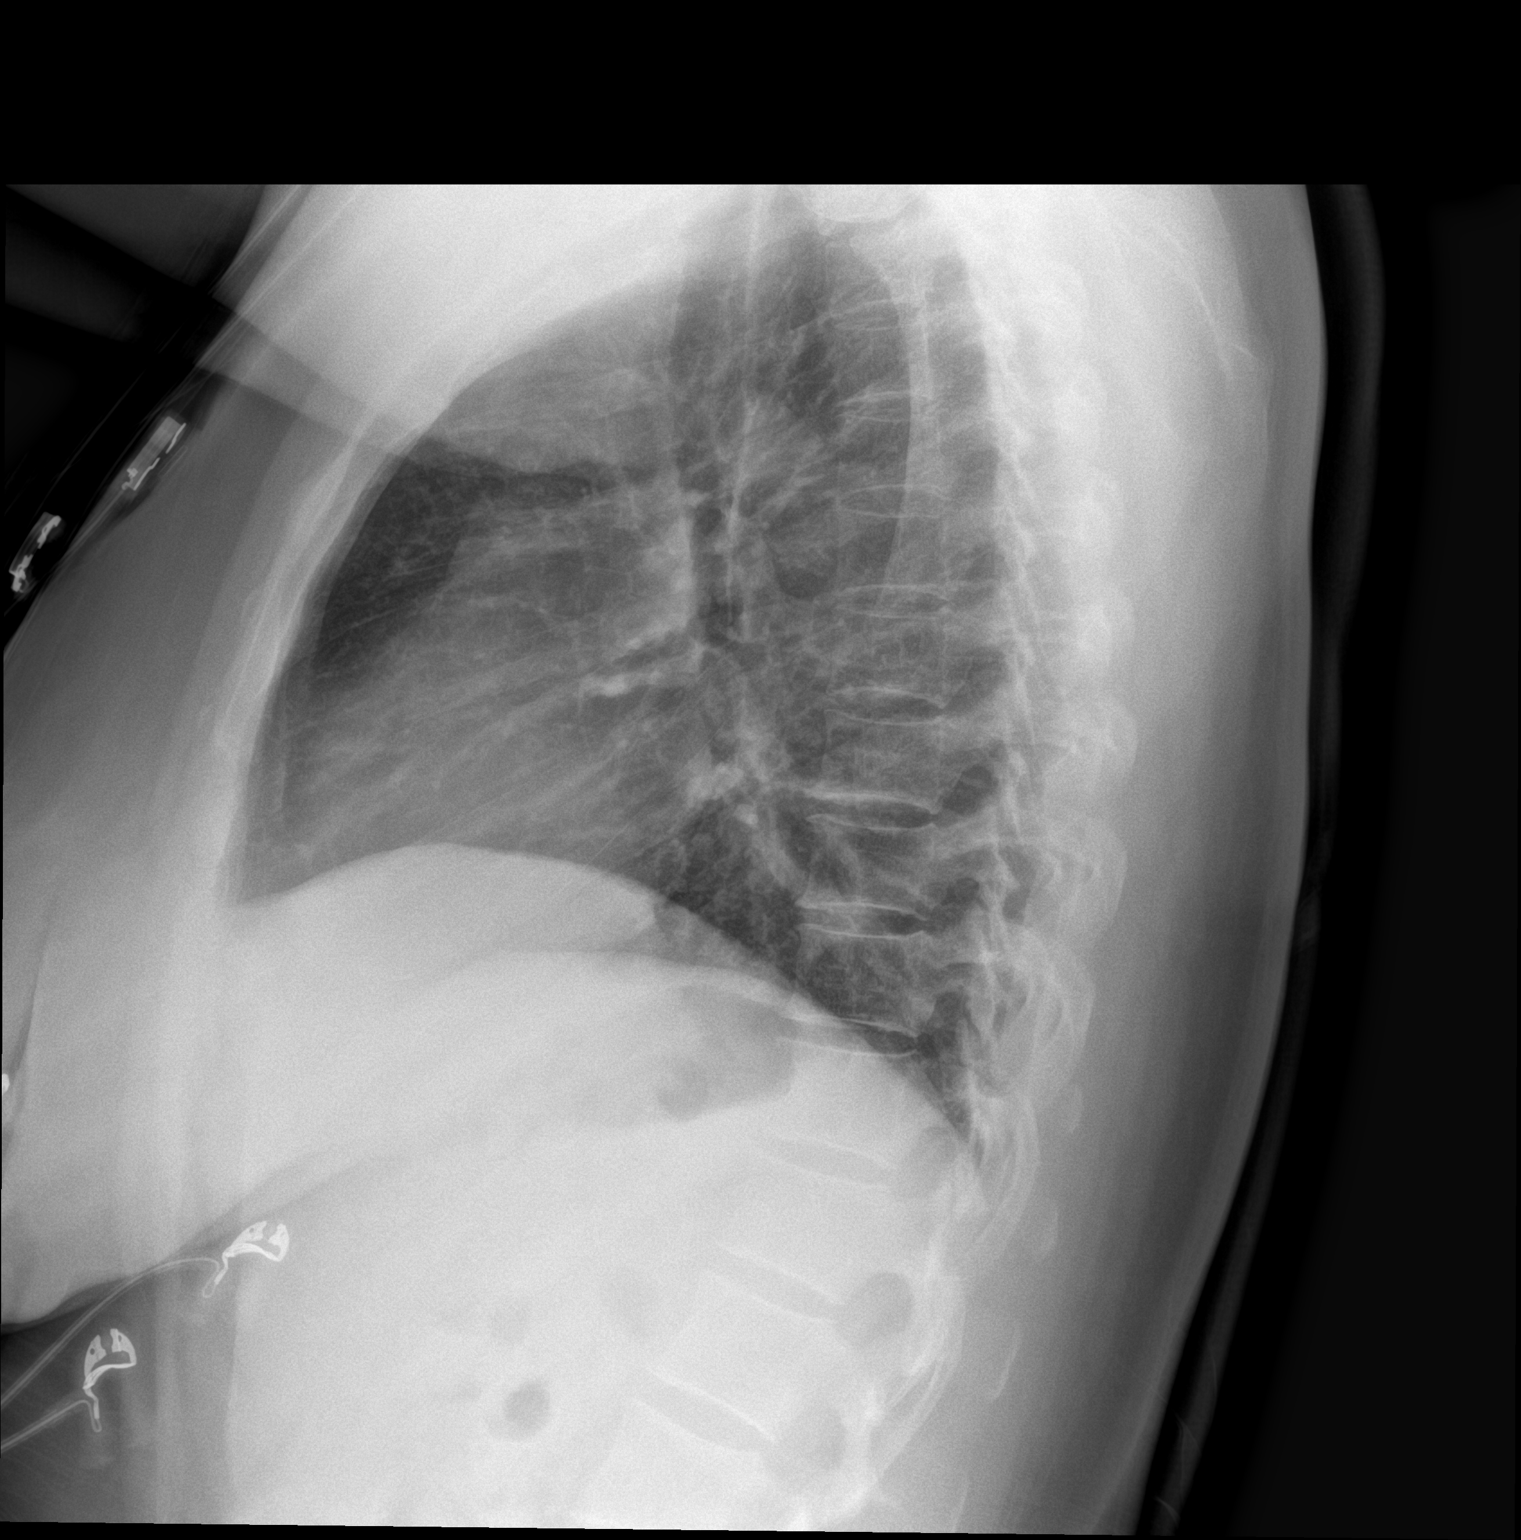

[2 of 2 positions shown; findings below may reference images not displayed]

FINDINGS: The heart size and mediastinal contours are within normal limits.
Both lungs are clear. The visualized skeletal structures are
unremarkable.
IMPRESSION: No active cardiopulmonary disease.

## 2022-07-18 ENCOUNTER — Ambulatory Visit: Payer: Self-pay | Admitting: Physical Therapy

## 2022-07-18 ENCOUNTER — Encounter: Payer: Self-pay | Admitting: Physical Therapy

## 2022-07-18 DIAGNOSIS — R2689 Other abnormalities of gait and mobility: Secondary | ICD-10-CM

## 2022-07-18 DIAGNOSIS — M25572 Pain in left ankle and joints of left foot: Secondary | ICD-10-CM

## 2022-07-18 NOTE — Therapy (Signed)
OUTPATIENT PHYSICAL THERAPY TREATMENT NOTE   Patient Name: Joanna Whitney MRN: [id number] DOB:03-05-1980, 42 y.o., female Today's Date: 07/18/2022  PCP: No PCP in chart   REFERRING PROVIDER: Teryl Lucy, MD   END OF SESSION:   PT End of Session - 07/18/22 0807     Visit Number 8    Number of Visits 17    Date for PT Re-Evaluation 08/02/22    Authorization Type none    PT Start Time 0805    PT Stop Time 0845    PT Time Calculation (min) 40 min              Past Medical History:  Diagnosis Date   Ankle fracture    Left   GERD (gastroesophageal reflux disease)    Gestational diabetes    Hx of toxoplasmosis    Medical history non-contributory    Past Surgical History:  Procedure Laterality Date   NO PAST SURGERIES     ORIF ANKLE FRACTURE Left 03/03/2022   Procedure: OPEN REDUCTION INTERNAL FIXATION (ORIF) ANKLE FRACTURE;  Surgeon: Teryl Lucy, MD;  Location: [facility] OR;  Service: Orthopedics;  Laterality: Left;   SYNDESMOSIS REPAIR Left 03/03/2022   Procedure: SYNDESMOSIS REPAIR;  Surgeon: Teryl Lucy, MD;  Location: [facility] OR;  Service: Orthopedics;  Laterality: Left;   Patient Active Problem List   Diagnosis Date Noted   History of gestational diabetes 06/13/2017   Obesity (BMI 30-39.9) 03/09/2017   Language barrier 03/09/2017    REFERRING DIAG: left ankle open reduction and internal fixation dos 03/03/22    THERAPY DIAG:  Pain in left ankle and joints of left foot  Other abnormalities of gait and mobility  Rationale for Evaluation and Treatment Rehabilitation  ONSET DATE: 03/03/22 L ankle ORIF    SUBJECTIVE:    SUBJECTIVE STATEMENT: No pain. I am riding my bike. I do 5 minute walks 3 times per day. I use my crutches because I will have pain If I do not. The pain can be a 5/10 without my crutches.      PERTINENT HISTORY: Unremarkable per chart review, pt states she occasionally has random palpitations but received cardiac work up which was  reportedly unremarkable PAIN:  Are you having pain: 0/10 Location/description: around joint line and on top of ankle Best worst over past week: 0-5/10  Per eval -  Best-worst over past week: 0-7/10  - aggravating factors: WB, ankle movement  - Easing factors: rest, elevation   PRECAUTIONS: fall risk   WEIGHT BEARING RESTRICTIONS: No (pt states she is WBAT but has difficulty bearing weight)   FALLS:  Has patient fallen in last 6 months? Yes. Number of falls 1 fall, precipitating episode    LIVING ENVIRONMENT: 1 story 4 STE no rail Has axillary crutches and RW Lives w/ kids (4 years, 7 years, 50 years, 83 years old) and their father    OCCUPATION: [employer] features editor    PLOF: Independent   PATIENT GOALS: less pain, use ankle more    NEXT MD VISIT: July     OBJECTIVE: (objective measures completed at initial evaluation unless otherwise dated)   DIAGNOSTIC FINDINGS:  S/p ORIF L ankle 03/03/22 Pt states she is WBAT    PATIENT SURVEYS:  FOTO 40 current, 67 predicted 07/13/22: FOTO 57    COGNITION: Overall cognitive status: Within functional limits for tasks assessed                         SENSATION:  Denies neuro complaints, appears grossly intact during palpation exam   EDEMA/OBSERVATION:  Mild swelling noted about ankle joint  Pt is noted to still have tape/surgical markings on her foot, significantly dried skin - states she is not sure if she is allowed to remove it or wash foot. Advised to discuss with physician at their appt tomorrow for self care    PALPATION: Tenderness throughout medial and lateral portions of gastroc/soleus more pronounced distally towards achilles tendon.   LOWER EXTREMITY ROM:   Active ROM Right eval Left eval 06/30/22 07/05/22 07/13/22 07/18/22  Hip flexion          Hip extension          Hip abduction          Hip adduction          Hip internal rotation          Hip external rotation          Knee flexion          Knee extension           Ankle dorsiflexion WFL -12 from neutral (passive) -5 from Neutral PROM -5 from Neutral PROM supine -5 from neutral PROM supine  -5  Ankle plantarflexion WFL 30deg (passive)      Ankle inversion   NT      Ankle eversion   NT       (Blank rows = not tested) Comments: no AROM L ankle, compensations noted at toes   LOWER EXTREMITY MMT:   MMT Right eval Left eval  Knee flexion      Knee extension      Ankle dorsiflexion      Ankle plantarflexion      Ankle inversion      Ankle eversion       (Blank rows = not tested) Comments: NT on eval given lack of active movement in ankle joint     FUNCTIONAL TESTS:  Sit to stand: BUE support from raised mat, reduced WB through surgical limb with weight shift towards R, increased time and effort 07/13/22: 3-4 sec best Left 07/15/22: 2 MWT: 310 feet with 1 crutch 07/18/22: SLS -requires light touch   GAIT: Distance walked: within clinic Assistive device utilized: Crutches Level of assistance: Modified independence Comments: hop to pattern, pt treats surgical limb mostly as NWB with occasional toe touch     TODAY'S TREATMENT:   [facility] Adult PT Treatment:                                                DATE: 07/18/22 Therapeutic Exercise: Isometric inversion x10,manual overpressure Isometric eversion x 10, manual overpressure Heel raises edge of 2 inch step  SLS unable to release  Wooden rocker x 1 minute Blue rocker x 1 minute Gastroc and soleus stretches  Manual Therapy: DF mobs supine  DF mobs with movement- green strap at ankle 30 reps , 2 bouts with therex in between  Therapeutic Activity: Gait with single crutch   [facility] Adult PT Treatment:                                                DATE: 07/15/22 Therapeutic Exercise: Heel raises edge  of 2 inch step Gastroc and soleus stretches closed chain Tandem on [employer] trials each way  Manual Therapy: DF mobs supine and prone  Mobs with movement using step and therapist over pressure.    Therapeutic Activity: 2 MWT    [facility] Adult PT Treatment:                                                DATE: 07/13/22 Therapeutic Exercise: Haematologist - cues to avoid compensations Heel raises front edge of slant board x 10 Mini squats 10 x 2  Soleus stretch 5 sec x 10 Tandem stance 30 sec  SLS 3-4 sec best  YTB DF x 20  Rec bike x 3 minutes L1  Manual Therapy: Passive ankle DF, PF, toe flexion and ext STW to plantar fascia Metatarsal mobs     [facility] Adult PT Treatment:                                                DATE: 07/07/22 Therapeutic Exercise: YTB DF/PF 3x12 each  AP rockerboard PF/DF 3x10 emphasis on reducing toe compensations Manual Therapy: Long sitting; passive physiological movement  L ankle DF/PF to pt tolerance, hallux extension, metatarsal mobs Gait Training: ~67ft no AD, step to pattern, reduced WB, CGA for safety, cues for posture 1 lap around gym SPC RUE, cues for pacing, sequencing, relaxing RUE 1 lap around gym BIL axillary crutches cues for WB and posture Education on [facility] use, sizing, safety w/ household ambulation   [facility] Adult PT Treatment:                                                DATE: 07/05/22 Therapeutic Exercise: Supine DF and PF with yellow band x 20 each  Supine inversion and eversion isometrics in ball x 20 each  Standing: forward stepping, x 10 each Standing Runners stretch and soleus stretch Standing March at counter Standing wooden rocker board A/P x 1 minute SLR flexion and abduction 10 x 2 left  Bridge x 10 SL Bridge x 10 each  Figure 4 stretch left  Seated heel raises from edge of rolled towel Seated towel scrunches Manual Therapy: A/P supine ankle mobs, metatarsal mobs, passive ROM all planes of ankle, passive toe flexion and extension  Therapeutic Activity: Gait with Single crutch  Stepping forward and back with each leg- 1 UE at counter,working on weight shift, heel strike and toe off       PATIENT  EDUCATION:  Education details: rationale for interventions, safety w/ mobility Person educated: Patient Education method: Explanation, Demonstration, Tactile cues, Verbal cues, and Handouts Education comprehension: verbalized understanding, returned demonstration, verbal cues required, tactile cues required, and needs further education     HOME EXERCISE PROGRAM: Access Code: [email or web address] URL: [email or web address] Date: 06/17/2022 Prepared by: Joellyn Rued  Exercises - Seated Calf Stretch with Strap  - 2 x daily - 7 x weekly - 1-3 sets - 1-3 reps - 30sec hold - Supine Ankle Dorsiflexion and Plantarflexion AROM  - 2 x daily - 7 x weekly - 2 sets -  10 reps - Supine Ankle Circles  - 2 x daily - 7 x weekly - 2 sets - 10 reps - Seated Heel Slide  - 2 x daily - 7 x weekly - 2 sets - 10 reps - 5 hold - Standing Anterior Posterior Weight Shift with Chair  - 2 x daily - 7 x weekly - 2 sets - 10 reps - 5 hold - Standing Weight Shift Side to Side  - 2 x daily - 7 x weekly - 2 sets - 10 reps - 5 hold   ASSESSMENT:   CLINICAL IMPRESSION: 07/18/2022 Pt arrives w/ out pain, continues to endorse steady progress w/ PT, walking more and riding her indoor bike. She does continue to need 1 crutch with ambulation to avoid pain increase. Session continues to focus on manual techniques and Mobs with movement to increased DF. Minimal to no improvement in DF ROM over last 2 weeks although patient endorses overall improvement in pain and activity. She reports reduced stiffness at end of session.         OBJECTIVE IMPAIRMENTS: Abnormal gait, decreased activity tolerance, decreased balance, decreased endurance, decreased mobility, difficulty walking, decreased ROM, decreased strength, and pain.    ACTIVITY LIMITATIONS: carrying, lifting, bending, standing, squatting, stairs, transfers, and locomotion level   PARTICIPATION LIMITATIONS: meal prep, cleaning, laundry, shopping, and community activity    PERSONAL FACTORS: Time since onset of injury/illness/exacerbation are also affecting patient's functional outcome.    REHAB POTENTIAL: Good   CLINICAL DECISION MAKING: Stable/uncomplicated   EVALUATION COMPLEXITY: Low     GOALS: Goals reviewed with patient? No   SHORT TERM GOALS: Target date: 07/05/2022 Pt will demonstrate appropriate understanding and performance of initially prescribed HEP in order to facilitate improved independence with management of symptoms.  Baseline: HEP provided on eval Goal status: MET   2. Pt will score greater than or equal to 52 on FOTO in order to demonstrate improved perception of function due to symptoms.            Baseline: 40 07/13/22: 57             Goal status: MET  LONG TERM GOALS: Target date: 08/02/2022 Pt will score 67 or greater on FOTO in order to demonstrate improved perception of function due to symptoms.  Baseline: 40 07/13/22: 57 Goal status: ONGOING   2.  Pt will demonstrate at least 0 degrees of ankle DF AROM in order to facilitate improved tolerance to functional movements such as walking/stairs.  Baseline: see ROM chart above 07/13/22: -5 Goal status: ONGOING   3.  Pt will be able to ambulate at least 545ft with LRAD and appropriate gait mechanics with less than 2pt increase in pain on NPS in order to promote improved independence w/ household navigation. Baseline: axillary crutches, treats surgical limb as NWB 07/13/22: ambulating household distances single UE AD Goal status: ONGOING   4.  Pt will be able to perform 5 consecutive sit to stands without UE support, grossly symmetrical WB, less than 2 pt increase in pain for improved safety w/ transfers. Baseline: 1 repetition, altered mechanics and B UE support Goal status: INITIAL    5. Pt will demonstrate appropriate performance of final prescribed HEP in order to facilitate improved self-management of symptoms post-discharge.             Baseline: initial HEP prescribed             Goal status: INITIAL     6. Pt will  report ability to perform lower body dressing without assist for improved independence w/ ADLs.            Baseline: son assists w/ donning sock during session due to difficulty                    Goal status: INITIAL     PLAN:   PT FREQUENCY: 2x/week   PT DURATION: 8 weeks   PLANNED INTERVENTIONS: Therapeutic exercises, Therapeutic activity, Neuromuscular re-education, Balance training, Gait training, Patient/Family education, Self Care, Joint mobilization, Stair training, Aquatic Therapy, Dry Needling, Electrical stimulation, Cryotherapy, Moist heat, Taping, Vasopneumatic device, Manual therapy, and Re-evaluation   PLAN FOR NEXT SESSION: Consider extending POC at next visit: review/update HEP, emphasis on passive ankle mobility. WB and gait mechanics. [facility] practice in clinic , assess LTGs  Jannette Spanner, PTA 07/18/22 9:06 AM Phone: [phone] Fax: [phone]

## 2022-07-18 NOTE — Therapy (Signed)
OUTPATIENT PHYSICAL THERAPY TREATMENT NOTE   Patient Name: Dania Marsan MRN: [id number] DOB:08/05/1980, 42 y.o., female Today's Date: 07/18/2022  PCP: No PCP in chart   REFERRING PROVIDER: Teryl Lucy, MD   END OF SESSION:      Past Medical History:  Diagnosis Date   Ankle fracture    Left   GERD (gastroesophageal reflux disease)    Gestational diabetes    Hx of toxoplasmosis    Medical history non-contributory    Past Surgical History:  Procedure Laterality Date   NO PAST SURGERIES     ORIF ANKLE FRACTURE Left 03/03/2022   Procedure: OPEN REDUCTION INTERNAL FIXATION (ORIF) ANKLE FRACTURE;  Surgeon: Teryl Lucy, MD;  Location: [facility] OR;  Service: Orthopedics;  Laterality: Left;   SYNDESMOSIS REPAIR Left 03/03/2022   Procedure: SYNDESMOSIS REPAIR;  Surgeon: Teryl Lucy, MD;  Location: [facility] OR;  Service: Orthopedics;  Laterality: Left;   Patient Active Problem List   Diagnosis Date Noted   History of gestational diabetes 06/13/2017   Obesity (BMI 30-39.9) 03/09/2017   Language barrier 03/09/2017    REFERRING DIAG: left ankle open reduction and internal fixation dos 03/03/22    THERAPY DIAG:  No diagnosis found.  Rationale for Evaluation and Treatment Rehabilitation  ONSET DATE: 03/03/22 L ankle ORIF    SUBJECTIVE:    SUBJECTIVE STATEMENT: ***  *** No pain. I am riding my bike. I do 5 minute walks 3 times per day. I use my crutches because I will have pain If I do not. The pain can be a 5/10 without my crutches.      PERTINENT HISTORY: Unremarkable per chart review, pt states she occasionally has random palpitations but received cardiac work up which was reportedly unremarkable PAIN:  Are you having pain: *** 0/10 Location/description: around joint line and on top of ankle Best worst over past week: 0-5/10  Per eval -  Best-worst over past week: 0-7/10  - aggravating factors: WB, ankle movement  - Easing factors: rest, elevation    PRECAUTIONS: fall risk   WEIGHT BEARING RESTRICTIONS: No (pt states she is WBAT but has difficulty bearing weight)   FALLS:  Has patient fallen in last 6 months? Yes. Number of falls 1 fall, precipitating episode    LIVING ENVIRONMENT: 1 story 4 STE no rail Has axillary crutches and RW Lives w/ kids (4 years, 7 years, 14 years, 21 years old) and their father    OCCUPATION: [employer] features editor    PLOF: Independent   PATIENT GOALS: less pain, use ankle more    NEXT MD VISIT: July     OBJECTIVE: (objective measures completed at initial evaluation unless otherwise dated)   DIAGNOSTIC FINDINGS:  S/p ORIF L ankle 03/03/22 Pt states she is WBAT    PATIENT SURVEYS:  FOTO 40 current, 67 predicted 07/13/22: FOTO 57  07/20/22 FOTO ***    COGNITION: Overall cognitive status: Within functional limits for tasks assessed                         SENSATION: Denies neuro complaints, appears grossly intact during palpation exam   EDEMA/OBSERVATION:  Mild swelling noted about ankle joint  Pt is noted to still have tape/surgical markings on her foot, significantly dried skin - states she is not sure if she is allowed to remove it or wash foot. Advised to discuss with physician at their appt tomorrow for self care    PALPATION: Tenderness throughout medial and lateral portions of  gastroc/soleus more pronounced distally towards achilles tendon.   LOWER EXTREMITY ROM:   Active ROM Right eval Left eval 06/30/22 07/05/22 07/13/22 07/18/22  Hip flexion          Hip extension          Hip abduction          Hip adduction          Hip internal rotation          Hip external rotation          Knee flexion          Knee extension          Ankle dorsiflexion WFL -12 from neutral (passive) -5 from Neutral PROM -5 from Neutral PROM supine -5 from neutral PROM supine  -5  Ankle plantarflexion WFL 30deg (passive)      Ankle inversion   NT      Ankle eversion   NT       (Blank rows = not  tested) Comments: no AROM L ankle, compensations noted at toes   LOWER EXTREMITY MMT:   MMT Right eval Left eval  Knee flexion      Knee extension      Ankle dorsiflexion      Ankle plantarflexion      Ankle inversion      Ankle eversion       (Blank rows = not tested) Comments: NT on eval given lack of active movement in ankle joint     FUNCTIONAL TESTS:  Sit to stand: BUE support from raised mat, reduced WB through surgical limb with weight shift towards R, increased time and effort 07/13/22: 3-4 sec best Left 07/15/22: 2 MWT: 310 feet with 1 crutch 07/18/22: SLS -requires light touch  07/20/22:   5xSTS ***  TUG ***  ***    GAIT: Distance walked: within clinic Assistive device utilized: Crutches Level of assistance: Modified independence Comments: hop to pattern, pt treats surgical limb mostly as NWB with occasional toe touch     TODAY'S TREATMENT:   [facility] Adult PT Treatment:                                                DATE: 07/20/22 Therapeutic Exercise: *** Manual Therapy: *** Neuromuscular re-ed: *** Therapeutic Activity: *** Modalities: *** Self Care: ***   [facility] PT Treatment:                                                DATE: 07/18/22 Therapeutic Exercise: Isometric inversion x10,manual overpressure Isometric eversion x 10, manual overpressure Heel raises edge of 2 inch step  SLS unable to release  Wooden rocker x 1 minute Blue rocker x 1 minute Gastroc and soleus stretches  Manual Therapy: DF mobs supine  DF mobs with movement- green strap at ankle 30 reps , 2 bouts with therex in between  Therapeutic Activity: Gait with single crutch   [facility] Adult PT Treatment:  DATE: 07/15/22 Therapeutic Exercise: Heel raises edge of 2 inch step Gastroc and soleus stretches closed chain Tandem on AIREX trials each way  Manual Therapy: DF mobs supine and prone  Mobs with movement using step and  therapist over pressure.   Therapeutic Activity: 2 MWT    [facility] Adult PT Treatment:                                                DATE: 07/13/22 Therapeutic Exercise: Haematologist - cues to avoid compensations Heel raises front edge of slant board x 10 Mini squats 10 x 2  Soleus stretch 5 sec x 10 Tandem stance 30 sec  SLS 3-4 sec best  YTB DF x 20  Rec bike x 3 minutes L1  Manual Therapy: Passive ankle DF, PF, toe flexion and ext STW to plantar fascia Metatarsal mobs     [facility] Adult PT Treatment:                                                DATE: 07/07/22 Therapeutic Exercise: YTB DF/PF 3x12 each  AP rockerboard PF/DF 3x10 emphasis on reducing toe compensations Manual Therapy: Long sitting; passive physiological movement  L ankle DF/PF to pt tolerance, hallux extension, metatarsal mobs Gait Training: ~62ft no AD, step to pattern, reduced WB, CGA for safety, cues for posture 1 lap around gym SPC RUE, cues for pacing, sequencing, relaxing RUE 1 lap around gym BIL axillary crutches cues for WB and posture Education on [facility] use, sizing, safety w/ household ambulation   [facility] Adult PT Treatment:                                                DATE: 07/05/22 Therapeutic Exercise: Supine DF and PF with yellow band x 20 each  Supine inversion and eversion isometrics in ball x 20 each  Standing: forward stepping, x 10 each Standing Runners stretch and soleus stretch Standing March at counter Standing wooden rocker board A/P x 1 minute SLR flexion and abduction 10 x 2 left  Bridge x 10 SL Bridge x 10 each  Figure 4 stretch left  Seated heel raises from edge of rolled towel Seated towel scrunches Manual Therapy: A/P supine ankle mobs, metatarsal mobs, passive ROM all planes of ankle, passive toe flexion and extension  Therapeutic Activity: Gait with Single crutch  Stepping forward and back with each leg- 1 UE at counter,working on weight shift, heel strike and toe  off       PATIENT EDUCATION:  Education details: rationale for interventions, safety w/ mobility Person educated: Patient Education method: Explanation, Demonstration, Tactile cues, Verbal cues, and Handouts Education comprehension: verbalized understanding, returned demonstration, verbal cues required, tactile cues required, and needs further education     HOME EXERCISE PROGRAM: Access Code: [email or web address] URL: [email or web address] Date: 06/17/2022 Prepared by: Joellyn Rued  Exercises - Seated Calf Stretch with Strap  - 2 x daily - 7 x weekly - 1-3 sets - 1-3 reps - 30sec hold - Supine Ankle Dorsiflexion and Plantarflexion AROM  - 2 x daily -  7 x weekly - 2 sets - 10 reps - Supine Ankle Circles  - 2 x daily - 7 x weekly - 2 sets - 10 reps - Seated Heel Slide  - 2 x daily - 7 x weekly - 2 sets - 10 reps - 5 hold - Standing Anterior Posterior Weight Shift with Chair  - 2 x daily - 7 x weekly - 2 sets - 10 reps - 5 hold - Standing Weight Shift Side to Side  - 2 x daily - 7 x weekly - 2 sets - 10 reps - 5 hold   ASSESSMENT:   CLINICAL IMPRESSION: 07/18/2022 ***  *** Pt arrives w/ out pain, continues to endorse steady progress w/ PT, walking more and riding her indoor bike. She does continue to need 1 crutch with ambulation to avoid pain increase. Session continues to focus on manual techniques and Mobs with movement to increased DF. Minimal to no improvement in DF ROM over last 2 weeks although patient endorses overall improvement in pain and activity. She reports reduced stiffness at end of session.         OBJECTIVE IMPAIRMENTS: Abnormal gait, decreased activity tolerance, decreased balance, decreased endurance, decreased mobility, difficulty walking, decreased ROM, decreased strength, and pain.    ACTIVITY LIMITATIONS: carrying, lifting, bending, standing, squatting, stairs, transfers, and locomotion level   PARTICIPATION LIMITATIONS: meal prep, cleaning, laundry,  shopping, and community activity   PERSONAL FACTORS: Time since onset of injury/illness/exacerbation are also affecting patient's functional outcome.    REHAB POTENTIAL: Good   CLINICAL DECISION MAKING: Stable/uncomplicated   EVALUATION COMPLEXITY: Low     GOALS: Goals reviewed with patient? No   SHORT TERM GOALS: Target date: 07/05/2022 Pt will demonstrate appropriate understanding and performance of initially prescribed HEP in order to facilitate improved independence with management of symptoms.  Baseline: HEP provided on eval Goal status: MET   2. Pt will score greater than or equal to 52 on FOTO in order to demonstrate improved perception of function due to symptoms.            Baseline: 40 07/13/22: 57             Goal status: MET  LONG TERM GOALS: Target date: 08/02/2022 Pt will score 67 or greater on FOTO in order to demonstrate improved perception of function due to symptoms.  Baseline: 40 07/13/22: 57 07/20/22: ***  Goal status: ***    2.  Pt will demonstrate at least 0 degrees of ankle DF AROM in order to facilitate improved tolerance to functional movements such as walking/stairs.  Baseline: see ROM chart above 07/13/22: -5 07/20/22: *** Goal status: ***    3.  Pt will be able to ambulate at least 574ft with LRAD and appropriate gait mechanics with less than 2pt increase in pain on NPS in order to promote improved independence w/ household navigation. Baseline: axillary crutches, treats surgical limb as NWB 07/13/22: ambulating household distances single UE AD 07/20/22: ***  Goal status: ***    4.  Pt will be able to perform 5 consecutive sit to stands without UE support, grossly symmetrical WB, less than 2 pt increase in pain for improved safety w/ transfers. Baseline: 1 repetition, altered mechanics and B UE support Goal status: ***    5. Pt will demonstrate appropriate performance of final prescribed HEP in order to facilitate improved self-management of symptoms  post-discharge.             Baseline: initial HEP prescribed  07/20/22: ***             Goal status: ***      6. Pt will report ability to perform lower body dressing without assist for improved independence w/ ADLs.            Baseline: son assists w/ donning sock during session due to difficulty          07/20/22: ***            Goal status: ***      PLAN:   PT FREQUENCY: 2x/week   PT DURATION: 8 weeks   PLANNED INTERVENTIONS: Therapeutic exercises, Therapeutic activity, Neuromuscular re-education, Balance training, Gait training, Patient/Family education, Self Care, Joint mobilization, Stair training, Aquatic Therapy, Dry Needling, Electrical stimulation, Cryotherapy, Moist heat, Taping, Vasopneumatic device, Manual therapy, and Re-evaluation   PLAN FOR NEXT SESSION: Consider extending POC at next visit: review/update HEP, emphasis on passive ankle mobility. WB and gait mechanics. [facility] Fall practice in clinic , assess LTGs ***   Ashley Murrain PT, DPT 07/18/2022 1:03 PM

## 2022-07-20 ENCOUNTER — Encounter: Payer: Self-pay | Admitting: Physical Therapy

## 2022-07-20 ENCOUNTER — Ambulatory Visit: Payer: Self-pay | Admitting: Physical Therapy

## 2022-07-20 DIAGNOSIS — M25572 Pain in left ankle and joints of left foot: Secondary | ICD-10-CM

## 2022-07-20 DIAGNOSIS — R2689 Other abnormalities of gait and mobility: Secondary | ICD-10-CM

## 2022-07-20 DIAGNOSIS — M6281 Muscle weakness (generalized): Secondary | ICD-10-CM

## 2022-07-20 DIAGNOSIS — R600 Localized edema: Secondary | ICD-10-CM

## 2022-08-02 ENCOUNTER — Ambulatory Visit: Payer: Self-pay | Admitting: Physical Therapy

## 2022-08-04 ENCOUNTER — Ambulatory Visit: Payer: Self-pay | Attending: Orthopedic Surgery | Admitting: Physical Therapy

## 2022-08-04 ENCOUNTER — Encounter: Payer: Self-pay | Admitting: Physical Therapy

## 2022-08-04 DIAGNOSIS — R2689 Other abnormalities of gait and mobility: Secondary | ICD-10-CM | POA: Insufficient documentation

## 2022-08-04 DIAGNOSIS — M25572 Pain in left ankle and joints of left foot: Secondary | ICD-10-CM | POA: Insufficient documentation

## 2022-08-04 DIAGNOSIS — M6281 Muscle weakness (generalized): Secondary | ICD-10-CM | POA: Insufficient documentation

## 2022-08-04 DIAGNOSIS — R600 Localized edema: Secondary | ICD-10-CM | POA: Insufficient documentation

## 2022-08-04 NOTE — Therapy (Signed)
OUTPATIENT PHYSICAL THERAPY PROGRESS NOTE    Patient Name: Joanna Whitney MRN: [id number] DOB:05-Nov-1980, 42 y.o., female Today's Date: 08/04/2022    PCP: No PCP in chart   REFERRING PROVIDER: Teryl Lucy, MD   END OF SESSION:   PT End of Session - 08/04/22 0810     Visit Number 10    Number of Visits 17    Date for PT Re-Evaluation 08/17/22    Authorization Type none    PT Start Time 0806    PT Stop Time 0845    PT Time Calculation (min) 39 min               Past Medical History:  Diagnosis Date   Ankle fracture    Left   GERD (gastroesophageal reflux disease)    Gestational diabetes    Hx of toxoplasmosis    Medical history non-contributory    Past Surgical History:  Procedure Laterality Date   NO PAST SURGERIES     ORIF ANKLE FRACTURE Left 03/03/2022   Procedure: OPEN REDUCTION INTERNAL FIXATION (ORIF) ANKLE FRACTURE;  Surgeon: Teryl Lucy, MD;  Location: [facility] OR;  Service: Orthopedics;  Laterality: Left;   SYNDESMOSIS REPAIR Left 03/03/2022   Procedure: SYNDESMOSIS REPAIR;  Surgeon: Teryl Lucy, MD;  Location: [facility] OR;  Service: Orthopedics;  Laterality: Left;   Patient Active Problem List   Diagnosis Date Noted   History of gestational diabetes 06/13/2017   Obesity (BMI 30-39.9) 03/09/2017   Language barrier 03/09/2017    REFERRING DIAG: left ankle open reduction and internal fixation dos 03/03/22    THERAPY DIAG:  Pain in left ankle and joints of left foot  Other abnormalities of gait and mobility  Localized edema  Muscle weakness (generalized)  Rationale for Evaluation and Treatment Rehabilitation  ONSET DATE: 03/03/22 L ankle ORIF    SUBJECTIVE:    SUBJECTIVE STATEMENT: Mostly no pain with walking unless it is longer than 10 minutes, then pain reaches 3-4/10. I have started using a cane which is more work but feels better.   Next MD visit: July      PERTINENT HISTORY: Unremarkable per chart review, pt states she  occasionally has random palpitations but received cardiac work up which was reportedly unremarkable PAIN:  Are you having pain: 0/10 Location/description: around joint line and on top of ankle Best worst over past week: 0-5/10  Per eval -  Best-worst over past week: 0-4/10  - aggravating factors: WB, ankle movement  - Easing factors: rest, elevation   PRECAUTIONS: fall risk   WEIGHT BEARING RESTRICTIONS: No (pt states she is WBAT but has difficulty bearing weight)   FALLS:  Has patient fallen in last 6 months? Yes. Number of falls 1 fall, precipitating episode    LIVING ENVIRONMENT: 1 story 4 STE no rail Has axillary crutches and RW Lives w/ kids (4 years, 7 years, 38 years, 63 years old) and their father    OCCUPATION: Magazine features editor    PLOF: Independent   PATIENT GOALS: less pain, use ankle more    NEXT MD VISIT: July     OBJECTIVE: (objective measures completed at initial evaluation unless otherwise dated)   DIAGNOSTIC FINDINGS:  S/p ORIF L ankle 03/03/22 Pt states she is WBAT    PATIENT SURVEYS:  FOTO 40 current, 67 predicted 07/13/22: FOTO 57  07/20/22 FOTO 57    COGNITION: Overall cognitive status: Within functional limits for tasks assessed  SENSATION: Denies neuro complaints, appears grossly intact during palpation exam   EDEMA/OBSERVATION:  Mild swelling noted about ankle joint  Pt is noted to still have tape/surgical markings on her foot, significantly dried skin - states she is not sure if she is allowed to remove it or wash foot. Advised to discuss with physician at their appt tomorrow for self care    PALPATION: Tenderness throughout medial and lateral portions of gastroc/soleus more pronounced distally towards achilles tendon.   LOWER EXTREMITY ROM:   Active ROM Right eval Left eval 06/30/22 07/05/22 07/13/22 07/18/22 07/20/22 ROM  Hip flexion           Hip extension           Hip abduction           Hip adduction            Hip internal rotation           Hip external rotation           Knee flexion           Knee extension           Ankle dorsiflexion WFL -12 from neutral (passive) -5 from Neutral PROM -5 from Neutral PROM supine -5 from neutral PROM supine  -5 A: - 6 deg P: -3 deg  Ankle plantarflexion WFL 30deg (passive)       Ankle inversion   NT       Ankle eversion   NT        (Blank rows = not tested) Comments: no AROM L ankle, compensations noted at toes   LOWER EXTREMITY MMT:   MMT Right eval Left eval  Knee flexion      Knee extension      Ankle dorsiflexion      Ankle plantarflexion      Ankle inversion      Ankle eversion       (Blank rows = not tested) Comments: NT on eval given lack of active movement in ankle joint     FUNCTIONAL TESTS:  Sit to stand: BUE support from raised mat, reduced WB through surgical limb with weight shift towards R, increased time and effort 07/13/22: 3-4 sec best Left 07/15/22: 2 MWT: 310 feet with 1 crutch 07/18/22: SLS -requires light touch 08/04/22: SLS 3-4 sec best  07/20/22:   5xSTS 14.18sec gentle UE support from thighs  TUG 12.83sec no AD, 13.28sec with unilat crutch  343ft unilat crutch (initial discomfort but improves with repetition)    GAIT: Distance walked: within clinic Assistive device utilized: Crutches Level of assistance: Modified independence Comments: hop to pattern, pt treats surgical limb mostly as NWB with occasional toe touch     TODAY'S TREATMENT:   [facility] Adult PT Treatment:                                                DATE: 08/04/22 Therapeutic Exercise: 6 inch step up  4 inch lateral step down Blue rocker A/P and lateral rocking  3 way hip with SLS and light touch, added foam   Neuromuscular re-ed: Tandem stance on AIREX SLS level surface 3 sec best  Therapeutic Activity: Stepping over hurdles reciprocally without UE Side stepping over hurdles  Mobs with movement x 30 , strap and step     [facility] Adult  PT  Treatment:                                                DATE: 07/20/22 Therapeutic Exercise: 4inch step up LLE no UE support 2x10 Standing gastroc stretch x10 Verbal HEP review  Therapeutic Activity: 5xSTS, TUG, and + education for each as it relates to function mobility/tolerance FOTO + education Education/discussion re: progress with PT, symptom behavior as it affects activity tolerance, PT goals/POC       PATIENT EDUCATION:  Education details: rationale for interventions, safety w/ mobility, progress note Person educated: Patient Education method: Explanation, Demonstration, Tactile cues, Verbal cues, and Handouts Education comprehension: verbalized understanding, returned demonstration, verbal cues required, tactile cues required, and needs further education     HOME EXERCISE PROGRAM: Access Code: [email or web address] URL: [email or web address] Date: 06/17/2022 Prepared by: Joellyn Rued  Exercises - Seated Calf Stretch with Strap  - 2 x daily - 7 x weekly - 1-3 sets - 1-3 reps - 30sec hold - Supine Ankle Dorsiflexion and Plantarflexion AROM  - 2 x daily - 7 x weekly - 2 sets - 10 reps - Supine Ankle Circles  - 2 x daily - 7 x weekly - 2 sets - 10 reps - Seated Heel Slide  - 2 x daily - 7 x weekly - 2 sets - 10 reps - 5 hold - Standing Anterior Posterior Weight Shift with Chair  - 2 x daily - 7 x weekly - 2 sets - 10 reps - 5 hold - Standing Weight Shift Side to Side  - 2 x daily - 7 x weekly - 2 sets - 10 reps - 5 hold   ASSESSMENT:   CLINICAL IMPRESSION: 08/04/2022 Pt arrives w/ 0/10 pain, using SPC. She reports SPC is more challenging however she is improving. She continues to walk and perform HEP. Continued with functional strength and dynamic gait/balance. She is most challenged in SLS.  She will continued to benefit from PT to maximize functional tolerance/independence.   OBJECTIVE IMPAIRMENTS: Abnormal gait, decreased activity tolerance, decreased balance,  decreased endurance, decreased mobility, difficulty walking, decreased ROM, decreased strength, and pain.    ACTIVITY LIMITATIONS: carrying, lifting, bending, standing, squatting, stairs, transfers, and locomotion level   PARTICIPATION LIMITATIONS: meal prep, cleaning, laundry, shopping, and community activity   PERSONAL FACTORS: Time since onset of injury/illness/exacerbation are also affecting patient's functional outcome.    REHAB POTENTIAL: Good   CLINICAL DECISION MAKING: Stable/uncomplicated   EVALUATION COMPLEXITY: Low     GOALS: Goals reviewed with patient? No   SHORT TERM GOALS: Target date: 07/05/2022 Pt will demonstrate appropriate understanding and performance of initially prescribed HEP in order to facilitate improved independence with management of symptoms.  Baseline: HEP provided on eval Goal status: MET   2. Pt will score greater than or equal to 52 on FOTO in order to demonstrate improved perception of function due to symptoms.            Baseline: 40 07/13/22: 57             Goal status: MET  LONG TERM GOALS: Target date: 08/17/2022 (updated 07/20/22) Pt will score 67 or greater on FOTO in order to demonstrate improved perception of function due to symptoms.  Baseline: 40 07/13/22: 57 07/20/22: 57  Goal status: ONGOING    2.  Pt will demonstrate at  least 0 degrees of ankle DF AROM in order to facilitate improved tolerance to functional movements such as walking/stairs.  Baseline: see ROM chart above 07/13/22: -5 07/20/22: -6 actively, -3 passively Goal status: ONGOING    3.  Pt will be able to ambulate at least 532ft with LRAD and appropriate gait mechanics with less than 2pt increase in pain on NPS in order to promote improved independence w/ household navigation. Baseline: axillary crutches, treats surgical limb as NWB 07/13/22: ambulating household distances single UE AD 07/20/22: 379ft with  Goal status: PROGRESSING    4.  Pt will be able to perform 5  consecutive sit to stands without UE support, grossly symmetrical WB, less than 2 pt increase in pain for improved safety w/ transfers. Baseline: 1 repetition, altered mechanics and B UE support 07/20/22: able to perform w/o UE support, no pain, reduced WB through surgical limb noted Goal status: PARTIALLY MET    5. Pt will demonstrate appropriate performance of final prescribed HEP in order to facilitate improved self-management of symptoms post-discharge.             Baseline: initial HEP prescribed  07/20/22: good HEP adherence reported             Goal status: ONGOING      6. Pt will report ability to perform lower body dressing without assist for improved independence w/ ADLs.            Baseline: son assists w/ donning sock during session due to difficulty          07/20/22: improving            Goal status: PROGRESSING   7. Pt will be able to perform 5xSTS in less than or equal to 10 sec in order to indicate reduced fall risk and improved functional mobility (MCID of 2.3sec, age cohort norm 6.2+/-1.3sec per Billie Ruddy et al 2007)   Baseline: 12 sec  Goal status: NEW 07/20/22  8. Pt will be able to perform TUG in less than or equal to 10 sec in order to indicate reduced risk of falling (MDC ~3sec)  Baseline: 12.83sec no AD, altered mechanics  Goal status: NEW 07/20/22      PLAN: updated 07/20/22   PT FREQUENCY: 2x/week   PT DURATION: 4 weeks   PLANNED INTERVENTIONS: Therapeutic exercises, Therapeutic activity, Neuromuscular re-education, Balance training, Gait training, Patient/Family education, Self Care, Joint mobilization, Stair training, Aquatic Therapy, Dry Needling, Electrical stimulation, Cryotherapy, Moist heat, Taping, Vasopneumatic device, Manual therapy, and Re-evaluation   PLAN FOR NEXT SESSION: continue working on functional mobility and WB tolerance, gait mechanics. Ankle mobility as able/tolerated.   Jannette Spanner, PTA 08/04/22 10:49 AM Phone: [phone] Fax:  [phone]

## 2022-08-06 ENCOUNTER — Ambulatory Visit: Payer: Self-pay | Admitting: Physical Therapy

## 2022-08-06 ENCOUNTER — Encounter: Payer: Self-pay | Admitting: Physical Therapy

## 2022-08-06 DIAGNOSIS — R2689 Other abnormalities of gait and mobility: Secondary | ICD-10-CM

## 2022-08-06 DIAGNOSIS — M25572 Pain in left ankle and joints of left foot: Secondary | ICD-10-CM

## 2022-08-06 DIAGNOSIS — R600 Localized edema: Secondary | ICD-10-CM

## 2022-08-06 DIAGNOSIS — M6281 Muscle weakness (generalized): Secondary | ICD-10-CM

## 2022-08-06 NOTE — Therapy (Signed)
OUTPATIENT PHYSICAL THERAPY PROGRESS NOTE    Patient Name: Joanna Whitney MRN: [id number] DOB:1980/06/11, 42 y.o., female Today's Date: 08/06/2022    PCP: No PCP in chart   REFERRING PROVIDER: Teryl Lucy, MD   END OF SESSION:   PT End of Session - 08/06/22 0915     Visit Number 11    Number of Visits 17    Date for PT Re-Evaluation 08/17/22    Authorization Type none    PT Start Time 0805    PT Stop Time 0845    PT Time Calculation (min) 40 min               Past Medical History:  Diagnosis Date   Ankle fracture    Left   GERD (gastroesophageal reflux disease)    Gestational diabetes    Hx of toxoplasmosis    Medical history non-contributory    Past Surgical History:  Procedure Laterality Date   NO PAST SURGERIES     ORIF ANKLE FRACTURE Left 03/03/2022   Procedure: OPEN REDUCTION INTERNAL FIXATION (ORIF) ANKLE FRACTURE;  Surgeon: Teryl Lucy, MD;  Location: [facility] OR;  Service: Orthopedics;  Laterality: Left;   SYNDESMOSIS REPAIR Left 03/03/2022   Procedure: SYNDESMOSIS REPAIR;  Surgeon: Teryl Lucy, MD;  Location: [facility] OR;  Service: Orthopedics;  Laterality: Left;   Patient Active Problem List   Diagnosis Date Noted   History of gestational diabetes 06/13/2017   Obesity (BMI 30-39.9) 03/09/2017   Language barrier 03/09/2017    REFERRING DIAG: left ankle open reduction and internal fixation dos 03/03/22    THERAPY DIAG:  Pain in left ankle and joints of left foot  Other abnormalities of gait and mobility  Localized edema  Muscle weakness (generalized)  Rationale for Evaluation and Treatment Rehabilitation  ONSET DATE: 03/03/22 L ankle ORIF    SUBJECTIVE:    SUBJECTIVE STATEMENT: I can move my foot better after PT. I have a little pain.   Next MD visit: July      PERTINENT HISTORY: Unremarkable per chart review, pt states she occasionally has random palpitations but received cardiac work up which was reportedly unremarkable PAIN:   Are you having pain: 0/10 Location/description: around joint line and on top of ankle Best worst over past week: 0-5/10  Per eval -  Best-worst over past week: 0-4/10  - aggravating factors: WB, ankle movement  - Easing factors: rest, elevation   PRECAUTIONS: fall risk   WEIGHT BEARING RESTRICTIONS: No (pt states she is WBAT but has difficulty bearing weight)   FALLS:  Has patient fallen in last 6 months? Yes. Number of falls 1 fall, precipitating episode    LIVING ENVIRONMENT: 1 story 4 STE no rail Has axillary crutches and RW Lives w/ kids (4 years, 7 years, 63 years, 26 years old) and their father    OCCUPATION: [employer] features editor    PLOF: Independent   PATIENT GOALS: less pain, use ankle more    NEXT MD VISIT: July     OBJECTIVE: (objective measures completed at initial evaluation unless otherwise dated)   DIAGNOSTIC FINDINGS:  S/p ORIF L ankle 03/03/22 Pt states she is WBAT    PATIENT SURVEYS:  FOTO 40 current, 67 predicted 07/13/22: FOTO 57  07/20/22 FOTO 57    COGNITION: Overall cognitive status: Within functional limits for tasks assessed                         SENSATION: Denies neuro complaints, appears  grossly intact during palpation exam   EDEMA/OBSERVATION:  Mild swelling noted about ankle joint  Pt is noted to still have tape/surgical markings on her foot, significantly dried skin - states she is not sure if she is allowed to remove it or wash foot. Advised to discuss with physician at their appt tomorrow for self care    PALPATION: Tenderness throughout medial and lateral portions of gastroc/soleus more pronounced distally towards achilles tendon.   LOWER EXTREMITY ROM:   Active ROM Right eval Left eval 06/30/22 07/05/22 07/13/22 07/18/22 07/20/22 ROM  Hip flexion           Hip extension           Hip abduction           Hip adduction           Hip internal rotation           Hip external rotation           Knee flexion           Knee extension            Ankle dorsiflexion WFL -12 from neutral (passive) -5 from Neutral PROM -5 from Neutral PROM supine -5 from neutral PROM supine  -5 A: - 6 deg P: -3 deg  Ankle plantarflexion WFL 30deg (passive)       Ankle inversion   NT       Ankle eversion   NT        (Blank rows = not tested) Comments: no AROM L ankle, compensations noted at toes   LOWER EXTREMITY MMT:   MMT Right eval Left eval  Knee flexion      Knee extension      Ankle dorsiflexion      Ankle plantarflexion      Ankle inversion      Ankle eversion       (Blank rows = not tested) Comments: NT on eval given lack of active movement in ankle joint     FUNCTIONAL TESTS:  Sit to stand: BUE support from raised mat, reduced WB through surgical limb with weight shift towards R, increased time and effort 07/13/22: 3-4 sec best Left 07/15/22: 2 MWT: 310 feet with 1 crutch 07/18/22: SLS -requires light touch 08/04/22: SLS 3-4 sec best  07/20/22:   5xSTS 14.18sec gentle UE support from thighs  TUG 12.83sec no AD, 13.28sec with unilat crutch  345ft unilat crutch (initial discomfort but improves with repetition)    GAIT: Distance walked: within clinic Assistive device utilized: Crutches Level of assistance: Modified independence Comments: hop to pattern, pt treats surgical limb mostly as NWB with occasional toe touch     TODAY'S TREATMENT:   [facility] Adult PT Treatment:                                                DATE: 08/06/22 Therapeutic Exercise: Leg press 35# single leg  Wooden Rocker A/P x 1 min Heel raise/ toe raise  standing Standing gastroc and soleus stretches  Slant board stretch  STS x 15 , low chair  8 inch step up x 10 Elliptical L1 x 5 minutes (half forward, half backward)   Neuromuscular re-ed: Tandem stance  Tandem stance on AIREX SLS 4 sec best  Therapeutic Activity: Stairs negotiation , needs UE to descend  reciprocally.  Mobs with movement x 30 strap and step     [facility] Adult PT  Treatment:                                                DATE: 08/04/22 Therapeutic Exercise: 6 inch step up  4 inch lateral step down Blue rocker A/P and lateral rocking  3 way hip with SLS and light touch, added foam   Neuromuscular re-ed: Tandem stance on AIREX SLS level surface 3 sec best  Therapeutic Activity: Stepping over hurdles reciprocally without UE Side stepping over hurdles  Mobs with movement x 30 , strap and step     [facility] Adult PT Treatment:                                                DATE: 07/20/22 Therapeutic Exercise: 4inch step up LLE no UE support 2x10 Standing gastroc stretch x10 Verbal HEP review  Therapeutic Activity: 5xSTS, TUG, and + education for each as it relates to function mobility/tolerance FOTO + education Education/discussion re: progress with PT, symptom behavior as it affects activity tolerance, PT goals/POC       PATIENT EDUCATION:  Education details: rationale for interventions, safety w/ mobility, progress note Person educated: Patient Education method: Explanation, Demonstration, Tactile cues, Verbal cues, and Handouts Education comprehension: verbalized understanding, returned demonstration, verbal cues required, tactile cues required, and needs further education     HOME EXERCISE PROGRAM: Access Code: [email or web address] URL: [email or web address] Date: 06/17/2022 Prepared by: Joellyn Rued  Exercises - Seated Calf Stretch with Strap  - 2 x daily - 7 x weekly - 1-3 sets - 1-3 reps - 30sec hold - Supine Ankle Dorsiflexion and Plantarflexion AROM  - 2 x daily - 7 x weekly - 2 sets - 10 reps - Supine Ankle Circles  - 2 x daily - 7 x weekly - 2 sets - 10 reps - Seated Heel Slide  - 2 x daily - 7 x weekly - 2 sets - 10 reps - 5 hold - Standing Anterior Posterior Weight Shift with Chair  - 2 x daily - 7 x weekly - 2 sets - 10 reps - 5 hold - Standing Weight Shift Side to Side  - 2 x daily - 7 x weekly - 2 sets - 10 reps - 5  hold   ASSESSMENT:   CLINICAL IMPRESSION: 08/06/2022 Pt arrives w/ minimal  pain, using SPC. She is able to ascend reciprocal stairs with light HR assist. She does need UE to descend stairs reciprocally. Her SLS continues to be limited to 4 sec or less. Began single leg press and elliptical with good tolerance and continued ankle mobility and strength.  She will continued to benefit from PT to maximize functional tolerance/independence.   OBJECTIVE IMPAIRMENTS: Abnormal gait, decreased activity tolerance, decreased balance, decreased endurance, decreased mobility, difficulty walking, decreased ROM, decreased strength, and pain.    ACTIVITY LIMITATIONS: carrying, lifting, bending, standing, squatting, stairs, transfers, and locomotion level   PARTICIPATION LIMITATIONS: meal prep, cleaning, laundry, shopping, and community activity   PERSONAL FACTORS: Time since onset of injury/illness/exacerbation are also affecting patient's functional outcome.    REHAB POTENTIAL: Good   CLINICAL DECISION MAKING: Stable/uncomplicated  EVALUATION COMPLEXITY: Low     GOALS: Goals reviewed with patient? No   SHORT TERM GOALS: Target date: 07/05/2022 Pt will demonstrate appropriate understanding and performance of initially prescribed HEP in order to facilitate improved independence with management of symptoms.  Baseline: HEP provided on eval Goal status: MET   2. Pt will score greater than or equal to 52 on FOTO in order to demonstrate improved perception of function due to symptoms.            Baseline: 40 07/13/22: 57             Goal status: MET  LONG TERM GOALS: Target date: 08/17/2022 (updated 07/20/22) Pt will score 67 or greater on FOTO in order to demonstrate improved perception of function due to symptoms.  Baseline: 40 07/13/22: 57 07/20/22: 57  Goal status: ONGOING    2.  Pt will demonstrate at least 0 degrees of ankle DF AROM in order to facilitate improved tolerance to functional  movements such as walking/stairs.  Baseline: see ROM chart above 07/13/22: -5 07/20/22: -6 actively, -3 passively Goal status: ONGOING    3.  Pt will be able to ambulate at least 541ft with LRAD and appropriate gait mechanics with less than 2pt increase in pain on NPS in order to promote improved independence w/ household navigation. Baseline: axillary crutches, treats surgical limb as NWB 07/13/22: ambulating household distances single UE AD 07/20/22: 343ft with  Goal status: PROGRESSING    4.  Pt will be able to perform 5 consecutive sit to stands without UE support, grossly symmetrical WB, less than 2 pt increase in pain for improved safety w/ transfers. Baseline: 1 repetition, altered mechanics and B UE support 07/20/22: able to perform w/o UE support, no pain, reduced WB through surgical limb noted Goal status: PARTIALLY MET    5. Pt will demonstrate appropriate performance of final prescribed HEP in order to facilitate improved self-management of symptoms post-discharge.             Baseline: initial HEP prescribed  07/20/22: good HEP adherence reported             Goal status: ONGOING      6. Pt will report ability to perform lower body dressing without assist for improved independence w/ ADLs.            Baseline: son assists w/ donning sock during session due to difficulty          07/20/22: improving            Goal status: PROGRESSING   7. Pt will be able to perform 5xSTS in less than or equal to 10 sec in order to indicate reduced fall risk and improved functional mobility (MCID of 2.3sec, age cohort norm 6.2+/-1.3sec per Billie Ruddy et al 2007)   Baseline: 12 sec  Goal status: NEW 07/20/22  8. Pt will be able to perform TUG in less than or equal to 10 sec in order to indicate reduced risk of falling (MDC ~3sec)  Baseline: 12.83sec no AD, altered mechanics  Goal status: NEW 07/20/22      PLAN: updated 07/20/22   PT FREQUENCY: 2x/week   PT DURATION: 4 weeks   PLANNED  INTERVENTIONS: Therapeutic exercises, Therapeutic activity, Neuromuscular re-education, Balance training, Gait training, Patient/Family education, Self Care, Joint mobilization, Stair training, Aquatic Therapy, Dry Needling, Electrical stimulation, Cryotherapy, Moist heat, Taping, Vasopneumatic device, Manual therapy, and Re-evaluation   PLAN FOR NEXT SESSION: continue working on functional mobility  and WB tolerance, gait mechanics. Ankle mobility as able/tolerated.   Jannette Spanner, PTA 08/06/22 10:54 AM Phone: [phone] Fax: [phone]

## 2022-08-08 NOTE — Therapy (Signed)
OUTPATIENT PHYSICAL THERAPY PROGRESS NOTE    Patient Name: Joanna Whitney MRN: [id number] DOB:1980/06/08, 42 y.o., female Today's Date: 08/09/2022    PCP: No PCP in chart   REFERRING PROVIDER: Teryl Lucy, MD   END OF SESSION:   PT End of Session - 08/09/22 0805     Visit Number 12    Number of Visits 17    Date for PT Re-Evaluation 08/17/22    Authorization Type none    PT Start Time 0804    PT Stop Time 0845    PT Time Calculation (min) 41 min    Activity Tolerance Patient tolerated treatment well    Behavior During Therapy WFL for tasks assessed/performed                Past Medical History:  Diagnosis Date   Ankle fracture    Left   GERD (gastroesophageal reflux disease)    Gestational diabetes    Hx of toxoplasmosis    Medical history non-contributory    Past Surgical History:  Procedure Laterality Date   NO PAST SURGERIES     ORIF ANKLE FRACTURE Left 03/03/2022   Procedure: OPEN REDUCTION INTERNAL FIXATION (ORIF) ANKLE FRACTURE;  Surgeon: Teryl Lucy, MD;  Location: [facility] OR;  Service: Orthopedics;  Laterality: Left;   SYNDESMOSIS REPAIR Left 03/03/2022   Procedure: SYNDESMOSIS REPAIR;  Surgeon: Teryl Lucy, MD;  Location: [facility] OR;  Service: Orthopedics;  Laterality: Left;   Patient Active Problem List   Diagnosis Date Noted   History of gestational diabetes 06/13/2017   Obesity (BMI 30-39.9) 03/09/2017   Language barrier 03/09/2017    REFERRING DIAG: left ankle open reduction and internal fixation dos 03/03/22    THERAPY DIAG:  Pain in left ankle and joints of left foot  Other abnormalities of gait and mobility  Localized edema  Muscle weakness (generalized)  Rationale for Evaluation and Treatment Rehabilitation  ONSET DATE: 03/03/22 L ankle ORIF    SUBJECTIVE:    SUBJECTIVE STATEMENT: Pt reports fatigue and stifness of her L ankle with prolonged standing and walking.  Next MD visit: July      PERTINENT  HISTORY: Unremarkable per chart review, pt states she occasionally has random palpitations but received cardiac work up which was reportedly unremarkable PAIN:  Are you having pain: 0/10 Location/description: around joint line and on top of ankle Best worst over past week: 0-5/10  Per eval -  Best-worst over past week: 0-4/10  - aggravating factors: WB, ankle movement  - Easing factors: rest, elevation   PRECAUTIONS: fall risk   WEIGHT BEARING RESTRICTIONS: No (pt states she is WBAT but has difficulty bearing weight)   FALLS:  Has patient fallen in last 6 months? Yes. Number of falls 1 fall, precipitating episode    LIVING ENVIRONMENT: 1 story 4 STE no rail Has axillary crutches and RW Lives w/ kids (4 years, 7 years, 52 years, 2 years old) and their father    OCCUPATION: [employer] features editor    PLOF: Independent   PATIENT GOALS: less pain, use ankle more    NEXT MD VISIT: July     OBJECTIVE: (objective measures completed at initial evaluation unless otherwise dated)   DIAGNOSTIC FINDINGS:  S/p ORIF L ankle 03/03/22 Pt states she is WBAT    PATIENT SURVEYS:  FOTO 40 current, 67 predicted 07/13/22: FOTO 57  07/20/22 FOTO 57    COGNITION: Overall cognitive status: Within functional limits for tasks assessed  SENSATION: Denies neuro complaints, appears grossly intact during palpation exam   EDEMA/OBSERVATION:  Mild swelling noted about ankle joint  Pt is noted to still have tape/surgical markings on her foot, significantly dried skin - states she is not sure if she is allowed to remove it or wash foot. Advised to discuss with physician at their appt tomorrow for self care    PALPATION: Tenderness throughout medial and lateral portions of gastroc/soleus more pronounced distally towards achilles tendon.   LOWER EXTREMITY ROM:   Active ROM Right eval Left eval 06/30/22 07/05/22 07/13/22 07/18/22 07/20/22 ROM 08/09/22  Hip flexion            Hip  extension            Hip abduction            Hip adduction            Hip internal rotation            Hip external rotation            Knee flexion            Knee extension            Ankle dorsiflexion WFL -12 from neutral (passive) -5 from Neutral PROM -5 from Neutral PROM supine -5 from neutral PROM supine  -5 A: - 6 deg P: -3 deg A: -5  Ankle plantarflexion WFL 30deg (passive)        Ankle inversion   NT        Ankle eversion   NT         (Blank rows = not tested) Comments: no AROM L ankle, compensations noted at toes   LOWER EXTREMITY MMT:   MMT Right eval Left eval  Knee flexion      Knee extension      Ankle dorsiflexion      Ankle plantarflexion      Ankle inversion      Ankle eversion       (Blank rows = not tested) Comments: NT on eval given lack of active movement in ankle joint     FUNCTIONAL TESTS:  Sit to stand: BUE support from raised mat, reduced WB through surgical limb with weight shift towards R, increased time and effort 07/13/22: 3-4 sec best Left 07/15/22: 2 MWT: 310 feet with 1 crutch 07/18/22: SLS -requires light touch 08/04/22: SLS 3-4 sec best  07/20/22:   5xSTS 14.18sec gentle UE support from thighs  TUG 12.83sec no AD, 13.28sec with unilat crutch  325ft unilat crutch (initial discomfort but improves with repetition)    GAIT: Distance walked: within clinic Assistive device utilized: Crutches Level of assistance: Modified independence Comments: hop to pattern, pt treats surgical limb mostly as NWB with occasional toe touch     TODAY'S TREATMENT:  [facility] Adult PT Treatment:                                                DATE: 08/09/22 Manual Therapy: Grade # AP and PA mobs to the L talocrural jt Contract/relax stretch for PFs Therapeutic Exercise: Rec Bike 5 min L3 Ankle DF c RTB 3x10 Wooden Rocker A/P 2 x 1 min Heel raise/ toe raise  standing Standing gastroc and soleus stretches on I" rise Slant board stretch  Lunge L foot onto  Bosu ball 2x10  [facility] Adult PT Treatment:                                                DATE: 08/06/22 Therapeutic Exercise: Leg press 35# single leg  Wooden Rocker A/P x 1 min Heel raise/ toe raise  standing Standing gastroc and soleus stretches  Slant board stretch  STS x 15 , low chair  8 inch step up x 10 Elliptical L1 x 5 minutes (half forward, half backward)   Neuromuscular re-ed: Tandem stance  Tandem stance on AIREX SLS 4 sec best  Therapeutic Activity: Stairs negotiation , needs UE to descend reciprocally.  Mobs with movement x 30 strap and step   [facility] Adult PT Treatment:                                                DATE: 08/04/22 Therapeutic Exercise: 6 inch step up  4 inch lateral step down Blue rocker A/P and lateral rocking  3 way hip with SLS and light touch, added foam   Neuromuscular re-ed: Tandem stance on AIREX SLS level surface 3 sec best  Therapeutic Activity: Stepping over hurdles reciprocally without UE Side stepping over hurdles  Mobs with movement x 30 , strap and step     PATIENT EDUCATION:  Education details: rationale for interventions, safety w/ mobility, progress note Person educated: Patient Education method: Explanation, Demonstration, Tactile cues, Verbal cues, and Handouts Education comprehension: verbalized understanding, returned demonstration, verbal cues required, tactile cues required, and needs further education     HOME EXERCISE PROGRAM: Access Code: [email or web address] URL: [email or web address] Date: 06/17/2022 Prepared by: Joellyn Rued  Exercises - Seated Calf Stretch with Strap  - 2 x daily - 7 x weekly - 1-3 sets - 1-3 reps - 30sec hold - Supine Ankle Dorsiflexion and Plantarflexion AROM  - 2 x daily - 7 x weekly - 2 sets - 10 reps - Supine Ankle Circles  - 2 x daily - 7 x weekly - 2 sets - 10 reps - Seated Heel Slide  - 2 x daily - 7 x weekly - 2 sets - 10 reps - 5 hold - Standing Anterior Posterior Weight Shift  with Chair  - 2 x daily - 7 x weekly - 2 sets - 10 reps - 5 hold - Standing Weight Shift Side to Side  - 2 x daily - 7 x weekly - 2 sets - 10 reps - 5 hold   ASSESSMENT:   CLINICAL IMPRESSION: PT was completed for talocrural mobs and therex for L ankle ROM and strengthening to increase L ankle DF ROM. AROM for L ankle DF has not changed. Pt did report improved flexibility and decreased tightness with walking following the session. Pt tolerated PT today without adverse effects. Pt will continue to benefit from skilled PT to address impairments for improved function.  OBJECTIVE IMPAIRMENTS: Abnormal gait, decreased activity tolerance, decreased balance, decreased endurance, decreased mobility, difficulty walking, decreased ROM, decreased strength, and pain.    ACTIVITY LIMITATIONS: carrying, lifting, bending, standing, squatting, stairs, transfers, and locomotion level   PARTICIPATION LIMITATIONS: meal prep, cleaning, laundry, shopping, and community activity   PERSONAL FACTORS: Time since onset of injury/illness/exacerbation are also affecting  patient's functional outcome.    REHAB POTENTIAL: Good   CLINICAL DECISION MAKING: Stable/uncomplicated   EVALUATION COMPLEXITY: Low     GOALS: Goals reviewed with patient? No   SHORT TERM GOALS: Target date: 07/05/2022 Pt will demonstrate appropriate understanding and performance of initially prescribed HEP in order to facilitate improved independence with management of symptoms.  Baseline: HEP provided on eval Goal status: MET   2. Pt will score greater than or equal to 52 on FOTO in order to demonstrate improved perception of function due to symptoms.            Baseline: 40 07/13/22: 57             Goal status: MET  LONG TERM GOALS: Target date: 08/17/2022 (updated 07/20/22) Pt will score 67 or greater on FOTO in order to demonstrate improved perception of function due to symptoms.  Baseline: 40 07/13/22: 57 07/20/22: 57  Goal status:  ONGOING    2.  Pt will demonstrate at least 0 degrees of ankle DF AROM in order to facilitate improved tolerance to functional movements such as walking/stairs.  Baseline: see ROM chart above 07/13/22: -5 07/20/22: -6 actively, -3 passively 08/09/22: -5 Goal status: ONGOING    3.  Pt will be able to ambulate at least 589ft with LRAD and appropriate gait mechanics with less than 2pt increase in pain on NPS in order to promote improved independence w/ household navigation. Baseline: axillary crutches, treats surgical limb as NWB 07/13/22: ambulating household distances single UE AD 07/20/22: 312ft with  Goal status: PROGRESSING    4.  Pt will be able to perform 5 consecutive sit to stands without UE support, grossly symmetrical WB, less than 2 pt increase in pain for improved safety w/ transfers. Baseline: 1 repetition, altered mechanics and B UE support 07/20/22: able to perform w/o UE support, no pain, reduced WB through surgical limb noted Goal status: PARTIALLY MET    5. Pt will demonstrate appropriate performance of final prescribed HEP in order to facilitate improved self-management of symptoms post-discharge.             Baseline: initial HEP prescribed  07/20/22: good HEP adherence reported             Goal status: ONGOING      6. Pt will report ability to perform lower body dressing without assist for improved independence w/ ADLs.            Baseline: son assists w/ donning sock during session due to difficulty          07/20/22: improving            Goal status: PROGRESSING   7. Pt will be able to perform 5xSTS in less than or equal to 10 sec in order to indicate reduced fall risk and improved functional mobility (MCID of 2.3sec, age cohort norm 6.2+/-1.3sec per Billie Ruddy et al 2007)   Baseline: 12 sec  Goal status: NEW 07/20/22  8. Pt will be able to perform TUG in less than or equal to 10 sec in order to indicate reduced risk of falling (MDC ~3sec)  Baseline: 12.83sec no AD,  altered mechanics  Goal status: NEW 07/20/22      PLAN: updated 07/20/22   PT FREQUENCY: 2x/week   PT DURATION: 4 weeks   PLANNED INTERVENTIONS: Therapeutic exercises, Therapeutic activity, Neuromuscular re-education, Balance training, Gait training, Patient/Family education, Self Care, Joint mobilization, Stair training, Aquatic Therapy, Dry Needling, Electrical stimulation, Cryotherapy, Moist  heat, Taping, Vasopneumatic device, Manual therapy, and Re-evaluation   PLAN FOR NEXT SESSION: continue working on functional mobility and WB tolerance, gait mechanics. Ankle mobility as able/tolerated.   Nathanal Hermiz MS, PT 08/09/22 9:05 AM

## 2022-08-09 ENCOUNTER — Ambulatory Visit: Payer: Self-pay

## 2022-08-09 DIAGNOSIS — M25572 Pain in left ankle and joints of left foot: Secondary | ICD-10-CM

## 2022-08-09 DIAGNOSIS — R2689 Other abnormalities of gait and mobility: Secondary | ICD-10-CM

## 2022-08-09 DIAGNOSIS — M6281 Muscle weakness (generalized): Secondary | ICD-10-CM

## 2022-08-09 DIAGNOSIS — R600 Localized edema: Secondary | ICD-10-CM

## 2022-08-11 ENCOUNTER — Encounter: Payer: Self-pay | Admitting: Physical Therapy

## 2022-08-11 ENCOUNTER — Ambulatory Visit: Payer: Self-pay | Admitting: Physical Therapy

## 2022-08-11 DIAGNOSIS — R600 Localized edema: Secondary | ICD-10-CM

## 2022-08-11 DIAGNOSIS — M6281 Muscle weakness (generalized): Secondary | ICD-10-CM

## 2022-08-11 DIAGNOSIS — R2689 Other abnormalities of gait and mobility: Secondary | ICD-10-CM

## 2022-08-11 DIAGNOSIS — M25572 Pain in left ankle and joints of left foot: Secondary | ICD-10-CM

## 2022-08-11 NOTE — Therapy (Signed)
OUTPATIENT PHYSICAL THERAPY PROGRESS NOTE    Patient Name: Joanna Whitney MRN: [id number] DOB:[birth year], 42 y.o., female Today's Date: 08/11/2022    PCP: No PCP in chart   REFERRING PROVIDER: Teryl Lucy, MD   END OF SESSION:   PT End of Session - 08/11/22 0805     Visit Number 13    Number of Visits 17    Date for PT Re-Evaluation 08/17/22    Authorization Type none    PT Start Time 0804    PT Stop Time 0845    PT Time Calculation (min) 41 min                Past Medical History:  Diagnosis Date   Ankle fracture    Left   GERD (gastroesophageal reflux disease)    Gestational diabetes    Hx of toxoplasmosis    Medical history non-contributory    Past Surgical History:  Procedure Laterality Date   NO PAST SURGERIES     ORIF ANKLE FRACTURE Left 03/03/2022   Procedure: OPEN REDUCTION INTERNAL FIXATION (ORIF) ANKLE FRACTURE;  Surgeon: Teryl Lucy, MD;  Location: [facility] OR;  Service: Orthopedics;  Laterality: Left;   SYNDESMOSIS REPAIR Left 03/03/2022   Procedure: SYNDESMOSIS REPAIR;  Surgeon: Teryl Lucy, MD;  Location: [facility] OR;  Service: Orthopedics;  Laterality: Left;   Patient Active Problem List   Diagnosis Date Noted   History of gestational diabetes 06/13/2017   Obesity (BMI 30-39.9) 03/09/2017   Language barrier 03/09/2017    REFERRING DIAG: left ankle open reduction and internal fixation dos 03/03/22    THERAPY DIAG:  Pain in left ankle and joints of left foot  Other abnormalities of gait and mobility  Localized edema  Muscle weakness (generalized)  Rationale for Evaluation and Treatment Rehabilitation  ONSET DATE: 03/03/22 L ankle ORIF    SUBJECTIVE:    SUBJECTIVE STATEMENT: Pt reports min pain on arrival, 2/10 , MD appt 08/24/22  Next MD visit: July 31 st      PERTINENT HISTORY: Unremarkable per chart review, pt states she occasionally has random palpitations but received cardiac work up which was reportedly  unremarkable PAIN:  Are you having pain: 2/10 Location/description: around joint line and on top of ankle Best worst over past week: 0-5/10  Per eval -  Best-worst over past week: 0-4/10  - aggravating factors: WB, ankle movement  - Easing factors: rest, elevation   PRECAUTIONS: fall risk   WEIGHT BEARING RESTRICTIONS: No (pt states she is WBAT but has difficulty bearing weight)   FALLS:  Has patient fallen in last 6 months? Yes. Number of falls 1 fall, precipitating episode    LIVING ENVIRONMENT: 1 story 4 STE no rail Has axillary crutches and RW Lives w/ kids (4 years, 7 years, 71 years, 22 years old) and their father    OCCUPATION: [employer] features editor    PLOF: Independent   PATIENT GOALS: less pain, use ankle more    NEXT MD VISIT: July     OBJECTIVE: (objective measures completed at initial evaluation unless otherwise dated)   DIAGNOSTIC FINDINGS:  S/p ORIF L ankle 03/03/22 Pt states she is WBAT    PATIENT SURVEYS:  FOTO 40 current, 67 predicted 07/13/22: FOTO 57  07/20/22 FOTO 57    COGNITION: Overall cognitive status: Within functional limits for tasks assessed                         SENSATION: Denies neuro complaints, appears  grossly intact during palpation exam   EDEMA/OBSERVATION:  Mild swelling noted about ankle joint  Pt is noted to still have tape/surgical markings on her foot, significantly dried skin - states she is not sure if she is allowed to remove it or wash foot. Advised to discuss with physician at their appt tomorrow for self care    PALPATION: Tenderness throughout medial and lateral portions of gastroc/soleus more pronounced distally towards achilles tendon.   LOWER EXTREMITY ROM:   Active ROM Right eval Left eval 06/30/22 07/05/22 07/13/22 07/18/22 07/20/22 ROM 08/09/22  Hip flexion            Hip extension            Hip abduction            Hip adduction            Hip internal rotation            Hip external rotation            Knee  flexion            Knee extension            Ankle dorsiflexion WFL -12 from neutral (passive) -5 from Neutral PROM -5 from Neutral PROM supine -5 from neutral PROM supine  -5 A: - 6 deg P: -3 deg A: -5  Ankle plantarflexion WFL 30deg (passive)        Ankle inversion   NT        Ankle eversion   NT         (Blank rows = not tested) Comments: no AROM L ankle, compensations noted at toes   LOWER EXTREMITY MMT:   MMT Right eval Left eval  Knee flexion      Knee extension      Ankle dorsiflexion      Ankle plantarflexion      Ankle inversion      Ankle eversion       (Blank rows = not tested) Comments: NT on eval given lack of active movement in ankle joint     FUNCTIONAL TESTS:  Sit to stand: BUE support from raised mat, reduced WB through surgical limb with weight shift towards R, increased time and effort 07/13/22: 3-4 sec best Left 07/15/22: 2 MWT: 310 feet with 1 crutch 07/18/22: SLS -requires light touch 08/04/22: SLS 3-4 sec best  07/20/22:   5xSTS 14.18sec gentle UE support from thighs  TUG 12.83sec no AD, 13.28sec with unilat crutch  378ft unilat crutch (initial discomfort but improves with repetition)    GAIT: Distance walked: within clinic Assistive device utilized: Crutches Level of assistance: Modified independence Comments: hop to pattern, pt treats surgical limb mostly as NWB with occasional toe touch     TODAY'S TREATMENT:  [facility] Adult PT Treatment:                                                DATE: 08/11/22 Therapeutic Exercise: Seated towel slides EV, INV Seated red band Inv, ev, DF Seated baps L2  CW and CCW circles Elliptical L1 2.5 min forward and reverse, 5 min total Wooden rocker A/P x 1 min Stepping on and off AIREX without UE Hip abduction with SLS and light touch SLS trial 4 sec best  Bilat heel raises Gastroc stretch Manual Therapy:  Grade # AP and PA mobs to the L talocrural jt    [facility] Adult PT Treatment:                                                 DATE: 08/09/22 Manual Therapy: Grade # AP and PA mobs to the L talocrural jt Contract/relax stretch for PFs Therapeutic Exercise: Rec Bike 5 min L3 Ankle DF c RTB 3x10 Wooden Rocker A/P 2 x 1 min Heel raise/ toe raise  standing Standing gastroc and soleus stretches on I" rise Slant board stretch  Lunge L foot onto Bosu ball 2x10  [facility] Adult PT Treatment:                                                DATE: 08/06/22 Therapeutic Exercise: Leg press 35# single leg  Wooden Rocker A/P x 1 min Heel raise/ toe raise  standing Standing gastroc and soleus stretches  Slant board stretch  STS x 15 , low chair  8 inch step up x 10 Elliptical L1 x 5 minutes (half forward, half backward)   Neuromuscular re-ed: Tandem stance  Tandem stance on AIREX SLS 4 sec best  Therapeutic Activity: Stairs negotiation , needs UE to descend reciprocally.  Mobs with movement x 30 strap and step   [facility] Adult PT Treatment:                                                DATE: 08/04/22 Therapeutic Exercise: 6 inch step up  4 inch lateral step down Blue rocker A/P and lateral rocking  3 way hip with SLS and light touch, added foam   Neuromuscular re-ed: Tandem stance on AIREX SLS level surface 3 sec best  Therapeutic Activity: Stepping over hurdles reciprocally without UE Side stepping over hurdles  Mobs with movement x 30 , strap and step     PATIENT EDUCATION:  Education details: rationale for interventions, safety w/ mobility, progress note Person educated: Patient Education method: Explanation, Demonstration, Tactile cues, Verbal cues, and Handouts Education comprehension: verbalized understanding, returned demonstration, verbal cues required, tactile cues required, and needs further education     HOME EXERCISE PROGRAM: Access Code: [email or web address] URL: [email or web address] Date: 06/17/2022 Prepared by: Joellyn Rued  Exercises - Seated Calf Stretch with Strap  - 2  x daily - 7 x weekly - 1-3 sets - 1-3 reps - 30sec hold - Supine Ankle Dorsiflexion and Plantarflexion AROM  - 2 x daily - 7 x weekly - 2 sets - 10 reps - Supine Ankle Circles  - 2 x daily - 7 x weekly - 2 sets - 10 reps - Seated Heel Slide  - 2 x daily - 7 x weekly - 2 sets - 10 reps - 5 hold - Standing Anterior Posterior Weight Shift with Chair  - 2 x daily - 7 x weekly - 2 sets - 10 reps - 5 hold - Standing Weight Shift Side to Side  - 2 x daily - 7 x weekly - 2 sets - 10 reps - 5 hold   ASSESSMENT:  CLINICAL IMPRESSION: PT was completed for talocrural mobs and therex for L ankle ROM and strengthening to increase L ankle DF ROM. AROM for L ankle DF has not changed. Pt reports MD visit 08/24/22. Discussed patient plateau in progress and may consider HOLD until after MD follow up.  Pt tolerated PT today without adverse effects. Pt will continue to benefit from skilled PT to address impairments for improved function.  OBJECTIVE IMPAIRMENTS: Abnormal gait, decreased activity tolerance, decreased balance, decreased endurance, decreased mobility, difficulty walking, decreased ROM, decreased strength, and pain.    ACTIVITY LIMITATIONS: carrying, lifting, bending, standing, squatting, stairs, transfers, and locomotion level   PARTICIPATION LIMITATIONS: meal prep, cleaning, laundry, shopping, and community activity   PERSONAL FACTORS: Time since onset of injury/illness/exacerbation are also affecting patient's functional outcome.    REHAB POTENTIAL: Good   CLINICAL DECISION MAKING: Stable/uncomplicated   EVALUATION COMPLEXITY: Low     GOALS: Goals reviewed with patient? No   SHORT TERM GOALS: Target date: 07/05/2022 Pt will demonstrate appropriate understanding and performance of initially prescribed HEP in order to facilitate improved independence with management of symptoms.  Baseline: HEP provided on eval Goal status: MET   2. Pt will score greater than or equal to 52 on FOTO in order  to demonstrate improved perception of function due to symptoms.            Baseline: 40 07/13/22: 57             Goal status: MET  LONG TERM GOALS: Target date: 08/17/2022 (updated 07/20/22) Pt will score 67 or greater on FOTO in order to demonstrate improved perception of function due to symptoms.  Baseline: 40 07/13/22: 57 07/20/22: 57  Goal status: ONGOING    2.  Pt will demonstrate at least 0 degrees of ankle DF AROM in order to facilitate improved tolerance to functional movements such as walking/stairs.  Baseline: see ROM chart above 07/13/22: -5 07/20/22: -6 actively, -3 passively 08/09/22: -5 Goal status: ONGOING    3.  Pt will be able to ambulate at least 534ft with LRAD and appropriate gait mechanics with less than 2pt increase in pain on NPS in order to promote improved independence w/ household navigation. Baseline: axillary crutches, treats surgical limb as NWB 07/13/22: ambulating household distances single UE AD 07/20/22: 370ft with  Goal status: PROGRESSING    4.  Pt will be able to perform 5 consecutive sit to stands without UE support, grossly symmetrical WB, less than 2 pt increase in pain for improved safety w/ transfers. Baseline: 1 repetition, altered mechanics and B UE support 07/20/22: able to perform w/o UE support, no pain, reduced WB through surgical limb noted Goal status: PARTIALLY MET    5. Pt will demonstrate appropriate performance of final prescribed HEP in order to facilitate improved self-management of symptoms post-discharge.             Baseline: initial HEP prescribed  07/20/22: good HEP adherence reported             Goal status: ONGOING      6. Pt will report ability to perform lower body dressing without assist for improved independence w/ ADLs.            Baseline: son assists w/ donning sock during session due to difficulty          07/20/22: improving            Goal status: PROGRESSING   7. Pt will be able to  perform 5xSTS in less than or  equal to 10 sec in order to indicate reduced fall risk and improved functional mobility (MCID of 2.3sec, age cohort norm 6.2+/-1.3sec per Billie Ruddy et al 2007)   Baseline: 12 sec  Goal status: NEW 07/20/22  8. Pt will be able to perform TUG in less than or equal to 10 sec in order to indicate reduced risk of falling (MDC ~3sec)  Baseline: 12.83sec no AD, altered mechanics  Goal status: NEW 07/20/22      PLAN: updated 07/20/22   PT FREQUENCY: 2x/week   PT DURATION: 4 weeks   PLANNED INTERVENTIONS: Therapeutic exercises, Therapeutic activity, Neuromuscular re-education, Balance training, Gait training, Patient/Family education, Self Care, Joint mobilization, Stair training, Aquatic Therapy, Dry Needling, Electrical stimulation, Cryotherapy, Moist heat, Taping, Vasopneumatic device, Manual therapy, and Re-evaluation   PLAN FOR NEXT SESSION: continue working on functional mobility and WB tolerance, gait mechanics. Ankle mobility as able/tolerated. Consider HOLD and sched re-eval after F/U with MD  Jannette Spanner, PTA 08/11/22 8:52 AM Phone: [phone] Fax: [phone]

## 2022-08-16 ENCOUNTER — Ambulatory Visit: Payer: Self-pay | Admitting: Physical Therapy

## 2022-08-16 ENCOUNTER — Encounter: Payer: Self-pay | Admitting: Physical Therapy

## 2022-08-16 DIAGNOSIS — R600 Localized edema: Secondary | ICD-10-CM

## 2022-08-16 DIAGNOSIS — M25572 Pain in left ankle and joints of left foot: Secondary | ICD-10-CM

## 2022-08-16 DIAGNOSIS — R2689 Other abnormalities of gait and mobility: Secondary | ICD-10-CM

## 2022-08-16 DIAGNOSIS — M6281 Muscle weakness (generalized): Secondary | ICD-10-CM

## 2022-08-16 NOTE — Therapy (Signed)
OUTPATIENT PHYSICAL THERAPY PROGRESS NOTE    Patient Name: Joanna Whitney MRN: [id number] DOB:[birth year], 42 y.o., female Today's Date: 08/16/2022    PCP: No PCP in chart   REFERRING PROVIDER: Teryl Lucy, MD   END OF SESSION:   PT End of Session - 08/16/22 0801     Visit Number 14    Number of Visits 17    Date for PT Re-Evaluation 08/17/22    Authorization Type none    PT Start Time 0801    PT Stop Time 0845    PT Time Calculation (min) 44 min                Past Medical History:  Diagnosis Date   Ankle fracture    Left   GERD (gastroesophageal reflux disease)    Gestational diabetes    Hx of toxoplasmosis    Medical history non-contributory    Past Surgical History:  Procedure Laterality Date   NO PAST SURGERIES     ORIF ANKLE FRACTURE Left 03/03/2022   Procedure: OPEN REDUCTION INTERNAL FIXATION (ORIF) ANKLE FRACTURE;  Surgeon: Teryl Lucy, MD;  Location: [facility] OR;  Service: Orthopedics;  Laterality: Left;   SYNDESMOSIS REPAIR Left 03/03/2022   Procedure: SYNDESMOSIS REPAIR;  Surgeon: Teryl Lucy, MD;  Location: [facility];  Service: Orthopedics;  Laterality: Left;   Patient Active Problem List   Diagnosis Date Noted   History of gestational diabetes 06/13/2017   Obesity (BMI 30-39.9) 03/09/2017   Language barrier 03/09/2017    REFERRING DIAG: left ankle open reduction and internal fixation dos 03/03/22    THERAPY DIAG:  Pain in left ankle and joints of left foot  Other abnormalities of gait and mobility  Localized edema  Muscle weakness (generalized)  Rationale for Evaluation and Treatment Rehabilitation  ONSET DATE: 03/03/22 L ankle ORIF    SUBJECTIVE:    SUBJECTIVE STATEMENT: Pt reports no pain on arrival, 0/10 , MD appt 08/24/22  Next MD visit: July 31 st      PERTINENT HISTORY: Unremarkable per chart review, pt states she occasionally has random palpitations but received cardiac work up which was reportedly  unremarkable PAIN:  Are you having pain: 2/10 Location/description: around joint line and on top of ankle Best worst over past week: 0-5/10  Per eval -  Best-worst over past week: 0-4/10  - aggravating factors: WB, ankle movement  - Easing factors: rest, elevation   PRECAUTIONS: fall risk   WEIGHT BEARING RESTRICTIONS: No (pt states she is WBAT but has difficulty bearing weight)   FALLS:  Has patient fallen in last 6 months? Yes. Number of falls 1 fall, precipitating episode    LIVING ENVIRONMENT: 1 story 4 STE no rail Has axillary crutches and RW Lives w/ kids (4 years, 7 years, 16 years, 42 years old) and their father    OCCUPATION: [employer] features editor    PLOF: Independent   PATIENT GOALS: less pain, use ankle more    NEXT MD VISIT: July     OBJECTIVE: (objective measures completed at initial evaluation unless otherwise dated)   DIAGNOSTIC FINDINGS:  S/p ORIF L ankle 03/03/22 Pt states she is WBAT    PATIENT SURVEYS:  FOTO 40 current, 67 predicted 07/13/22: FOTO 57  07/20/22 FOTO 57    COGNITION: Overall cognitive status: Within functional limits for tasks assessed                         SENSATION: Denies neuro complaints, appears  grossly intact during palpation exam   EDEMA/OBSERVATION:  Mild swelling noted about ankle joint  Pt is noted to still have tape/surgical markings on her foot, significantly dried skin - states she is not sure if she is allowed to remove it or wash foot. Advised to discuss with physician at their appt tomorrow for self care    PALPATION: Tenderness throughout medial and lateral portions of gastroc/soleus more pronounced distally towards achilles tendon.   LOWER EXTREMITY ROM:   Active ROM Right eval Left eval 06/30/22 07/05/22 07/13/22 07/18/22 07/20/22 ROM 08/09/22  Hip flexion            Hip extension            Hip abduction            Hip adduction            Hip internal rotation            Hip external rotation            Knee  flexion            Knee extension            Ankle dorsiflexion WFL -12 from neutral (passive) -5 from Neutral PROM -5 from Neutral PROM supine -5 from neutral PROM supine  -5 A: - 6 deg P: -3 deg A: -5  Ankle plantarflexion WFL 30deg (passive)        Ankle inversion   NT        Ankle eversion   NT         (Blank rows = not tested) Comments: no AROM L ankle, compensations noted at toes   LOWER EXTREMITY MMT:   MMT Right eval Left eval  Knee flexion      Knee extension      Ankle dorsiflexion      Ankle plantarflexion      Ankle inversion      Ankle eversion       (Blank rows = not tested) Comments: NT on eval given lack of active movement in ankle joint     FUNCTIONAL TESTS:  Sit to stand: BUE support from raised mat, reduced WB through surgical limb with weight shift towards R, increased time and effort 07/13/22: 3-4 sec best Left 07/15/22: 2 MWT: 310 feet with 1 crutch 07/18/22: SLS -requires light touch 08/04/22: SLS 3-4 sec best  07/20/22:   5xSTS 14.18sec gentle UE support from thighs  TUG 12.83sec no AD, 13.28sec with unilat crutch  355ft unilat crutch (initial discomfort but improves with repetition)   08/16/22: 2 MWT : 452 feet with APC  5 x STS: 10.4 seconds , no UE, standard Mat        GAIT: Distance walked: within clinic Assistive device utilized: Crutches Level of assistance: Modified independence Comments: hop to pattern, pt treats surgical limb mostly as NWB with occasional toe touch     TODAY'S TREATMENT:  [facility] Adult PT Treatment:                                                DATE: 08/16/22 Therapeutic Exercise: Elliptical Ramp 3 Resistance 2, 2.5 min each way  Wooden rocker Tandem stance on AIREX SLS on floor 4 sec best  Stepping on and off AIREX without [place] Marching on AIREX without UE  Therapeutic Activity: 2 MWT 5 x STS   [facility] Adult PT Treatment:                                                DATE: 08/11/22 Therapeutic  Exercise: Seated towel slides EV, INV Seated red band Inv, ev, DF Seated baps L2  CW and CCW circles Elliptical L1 2.5 min forward and reverse, 5 min total Wooden rocker A/P x 1 min Stepping on and off AIREX without UE Hip abduction with SLS and light touch SLS trial 4 sec best  Bilat heel raises Gastroc stretch Manual Therapy: Grade # AP and PA mobs to the L talocrural jt    [facility] Adult PT Treatment:                                                DATE: 08/09/22 Manual Therapy: Grade # AP and PA mobs to the L talocrural jt Contract/relax stretch for PFs Therapeutic Exercise: Rec Bike 5 min L3 Ankle DF c RTB 3x10 Wooden Rocker A/P 2 x 1 min Heel raise/ toe raise  standing Standing gastroc and soleus stretches on I" rise Slant board stretch  Lunge L foot onto Bosu ball 2x10  [facility] Adult PT Treatment:                                                DATE: 08/06/22 Therapeutic Exercise: Leg press 35# single leg  Wooden Rocker A/P x 1 min Heel raise/ toe raise  standing Standing gastroc and soleus stretches  Slant board stretch  STS x 15 , low chair  8 inch step up x 10 Elliptical L1 x 5 minutes (half forward, half backward)   Neuromuscular re-ed: Tandem stance  Tandem stance on AIREX SLS 4 sec best  Therapeutic Activity: Stairs negotiation , needs UE to descend reciprocally.  Mobs with movement x 30 strap and step    PATIENT EDUCATION:  Education details: rationale for interventions, safety w/ mobility, progress note Person educated: Patient Education method: Explanation, Demonstration, Tactile cues, Verbal cues, and Handouts Education comprehension: verbalized understanding, returned demonstration, verbal cues required, tactile cues required, and needs further education     HOME EXERCISE PROGRAM: Access Code: [email or web address] URL: [email or web address] Date: 06/17/2022 Prepared by: Joellyn Rued  Exercises - Seated Calf Stretch with Strap  - 2 x daily - 7 x  weekly - 1-3 sets - 1-3 reps - 30sec hold - Supine Ankle Dorsiflexion and Plantarflexion AROM  - 2 x daily - 7 x weekly - 2 sets - 10 reps - Supine Ankle Circles  - 2 x daily - 7 x weekly - 2 sets - 10 reps - Seated Heel Slide  - 2 x daily - 7 x weekly - 2 sets - 10 reps - 5 hold - Standing Anterior Posterior Weight Shift with Chair  - 2 x daily - 7 x weekly - 2 sets - 10 reps - 5 hold - Standing Weight Shift Side to Side  - 2 x daily - 7 x weekly - 2 sets - 10 reps - 5 hold  ASSESSMENT:   CLINICAL IMPRESSION: Independent with lower body dressing. LTG# 6 met. She is ambulating >500 feet with SPC and no increased pain, maintains good mechanics with SPC. LTG# 3 met. She is at the end of her POC however will see the surgeon 7/31. She is progressing toward remaining LTGS. She is limited by DF AROM and will likely discuss potential hardware removal with MD to allow recovery of ROM and full functional activity. Discussed possibility of Holding PT until after MD appt. Will see primary PT next visit to discuss and reassess progress.  Pt tolerated PT today without adverse effects. Pt will continue to benefit from skilled PT to address impairments for improved function.  OBJECTIVE IMPAIRMENTS: Abnormal gait, decreased activity tolerance, decreased balance, decreased endurance, decreased mobility, difficulty walking, decreased ROM, decreased strength, and pain.    ACTIVITY LIMITATIONS: carrying, lifting, bending, standing, squatting, stairs, transfers, and locomotion level   PARTICIPATION LIMITATIONS: meal prep, cleaning, laundry, shopping, and community activity   PERSONAL FACTORS: Time since onset of injury/illness/exacerbation are also affecting patient's functional outcome.    REHAB POTENTIAL: Good   CLINICAL DECISION MAKING: Stable/uncomplicated   EVALUATION COMPLEXITY: Low     GOALS: Goals reviewed with patient? No   SHORT TERM GOALS: Target date: 07/05/2022 Pt will demonstrate appropriate  understanding and performance of initially prescribed HEP in order to facilitate improved independence with management of symptoms.  Baseline: HEP provided on eval Goal status: MET   2. Pt will score greater than or equal to 52 on FOTO in order to demonstrate improved perception of function due to symptoms.            Baseline: 40 07/13/22: 57             Goal status: MET  LONG TERM GOALS: Target date: 08/17/2022 (updated 07/20/22) Pt will score 67 or greater on FOTO in order to demonstrate improved perception of function due to symptoms.  Baseline: 40 07/13/22: 57 07/20/22: 57  Goal status: ONGOING    2.  Pt will demonstrate at least 0 degrees of ankle DF AROM in order to facilitate improved tolerance to functional movements such as walking/stairs.  Baseline: see ROM chart above 07/13/22: -5 07/20/22: -6 actively, -3 passively 08/09/22: -5 Goal status: ONGOING    3.  Pt will be able to ambulate at least 573ft with LRAD and appropriate gait mechanics with less than 2pt increase in pain on NPS in order to promote improved independence w/ household navigation. Baseline: axillary crutches, treats surgical limb as NWB 07/13/22: ambulating household distances single UE AD 07/20/22: 357ft with  08/16/22: Nearly 500 feet in clinic with SPC, good mechanics , no increased pain Goal status: MET   4.  Pt will be able to perform 5 consecutive sit to stands without UE support, grossly symmetrical WB, less than 2 pt increase in pain for improved safety w/ transfers. Baseline: 1 repetition, altered mechanics and B UE support 07/20/22: able to perform w/o UE support, no pain, reduced WB through surgical limb noted 08/16/22: equal WB STS x 5 in 10.4 sec  Goal status:  MET    5. Pt will demonstrate appropriate performance of final prescribed HEP in order to facilitate improved self-management of symptoms post-discharge.             Baseline: initial HEP prescribed  07/20/22: good HEP adherence reported              Goal status: ONGOING      6.  Pt will report ability to perform lower body dressing without assist for improved independence w/ ADLs.            Baseline: son assists w/ donning sock during session due to difficulty          07/20/22: improving   08/16/22: no longer requires assist            Goal status: MET  7. Pt will be able to perform 5xSTS in less than or equal to 10 sec in order to indicate reduced fall risk and improved functional mobility (MCID of 2.3sec, age cohort norm 6.2+/-1.3sec per Billie Ruddy et al 2007)   Baseline: 12 sec  08/16/22: 10.4 sec   Goal status: NEARLY MET   8. Pt will be able to perform TUG in less than or equal to 10 sec in order to indicate reduced risk of falling (MDC ~3sec)  Baseline: 12.83sec no AD, altered mechanics  Goal status: NEW 07/20/22      PLAN: updated 07/20/22   PT FREQUENCY: 2x/week   PT DURATION: 4 weeks   PLANNED INTERVENTIONS: Therapeutic exercises, Therapeutic activity, Neuromuscular re-education, Balance training, Gait training, Patient/Family education, Self Care, Joint mobilization, Stair training, Aquatic Therapy, Dry Needling, Electrical stimulation, Cryotherapy, Moist heat, Taping, Vasopneumatic device, Manual therapy, and Re-evaluation   PLAN FOR NEXT SESSION: continue working on functional mobility and WB tolerance, gait mechanics. Ankle mobility as able/tolerated. Needs Re-eval - sees MD on 7/31 Jannette Spanner, PTA 08/16/22 10:03 AM Phone: [phone] Fax: [phone]

## 2022-08-17 NOTE — Therapy (Signed)
OUTPATIENT PHYSICAL THERAPY RECERTIFICATION/PROGRESS NOTE   Patient Name: Joanna Whitney MRN: [id number] DOB:[birth year], 42 y.o., female Today's Date: 08/17/2022    PCP: No PCP in chart   REFERRING PROVIDER: Teryl Lucy, MD   END OF SESSION:        Past Medical History:  Diagnosis Date   Ankle fracture    Left   GERD (gastroesophageal reflux disease)    Gestational diabetes    Hx of toxoplasmosis    Medical history non-contributory    Past Surgical History:  Procedure Laterality Date   NO PAST SURGERIES     ORIF ANKLE FRACTURE Left 03/03/2022   Procedure: OPEN REDUCTION INTERNAL FIXATION (ORIF) ANKLE FRACTURE;  Surgeon: Teryl Lucy, MD;  Location: [facility] OR;  Service: Orthopedics;  Laterality: Left;   SYNDESMOSIS REPAIR Left 03/03/2022   Procedure: SYNDESMOSIS REPAIR;  Surgeon: Teryl Lucy, MD;  Location: [facility] OR;  Service: Orthopedics;  Laterality: Left;   Patient Active Problem List   Diagnosis Date Noted   History of gestational diabetes 06/13/2017   Obesity (BMI 30-39.9) 03/09/2017   Language barrier 03/09/2017    REFERRING DIAG: left ankle open reduction and internal fixation dos 03/03/22    THERAPY DIAG:  No diagnosis found.  Rationale for Evaluation and Treatment Rehabilitation  ONSET DATE: 03/03/22 L ankle ORIF    SUBJECTIVE:    SUBJECTIVE STATEMENT: ***  *** Pt reports no pain on arrival, 0/10 , MD appt 08/24/22  Next MD visit: July 31 st      PERTINENT HISTORY: Unremarkable per chart review, pt states she occasionally has random palpitations but received cardiac work up which was reportedly unremarkable PAIN:  Are you having pain: *** 2/10 Location/description: around joint line and on top of ankle Best worst over past week: 0-5/10  Per eval -  Best-worst over past week: 0-4/10  - aggravating factors: WB, ankle movement  - Easing factors: rest, elevation   PRECAUTIONS: fall risk   WEIGHT BEARING RESTRICTIONS: No (pt states she  is WBAT but has difficulty bearing weight)   FALLS:  Has patient fallen in last 6 months? Yes. Number of falls 1 fall, precipitating episode    LIVING ENVIRONMENT: 1 story 4 STE no rail Has axillary crutches and RW Lives w/ kids (4 years, 7 years, 73 years, 19 years old) and their father    OCCUPATION: Magazine features editor    PLOF: Independent   PATIENT GOALS: less pain, use ankle more    NEXT MD VISIT: July     OBJECTIVE: (objective measures completed at initial evaluation unless otherwise dated)   DIAGNOSTIC FINDINGS:  S/p ORIF L ankle 03/03/22 Pt states she is WBAT    PATIENT SURVEYS:  FOTO 40 current, 67 predicted 07/13/22: FOTO 57  07/20/22 FOTO 57  08/18/22: ***    COGNITION: Overall cognitive status: Within functional limits for tasks assessed                         SENSATION: Denies neuro complaints, appears grossly intact during palpation exam   EDEMA/OBSERVATION:  Mild swelling noted about ankle joint  Pt is noted to still have tape/surgical markings on her foot, significantly dried skin - states she is not sure if she is allowed to remove it or wash foot. Advised to discuss with physician at their appt tomorrow for self care    PALPATION: Tenderness throughout medial and lateral portions of gastroc/soleus more pronounced distally towards achilles tendon.   LOWER EXTREMITY ROM:  Active ROM Right eval Left eval 06/30/22 07/05/22 07/13/22 07/18/22 07/20/22 ROM 08/09/22 08/18/22 ROM  Hip flexion             Hip extension             Hip abduction             Hip adduction             Hip internal rotation             Hip external rotation             Knee flexion             Knee extension             Ankle dorsiflexion WFL -12 from neutral (passive) -5 from Neutral PROM -5 from Neutral PROM supine -5 from neutral PROM supine  -5 A: - 6 deg P: -3 deg A: -5 ***   Ankle plantarflexion WFL 30deg (passive)         Ankle inversion   NT         Ankle eversion   NT           (Blank rows = not tested) Comments: no AROM L ankle, compensations noted at toes   LOWER EXTREMITY MMT:   MMT Right eval Left eval  Knee flexion      Knee extension      Ankle dorsiflexion      Ankle plantarflexion      Ankle inversion      Ankle eversion       (Blank rows = not tested) Comments: NT on eval given lack of active movement in ankle joint     FUNCTIONAL TESTS:  Sit to stand: BUE support from raised mat, reduced WB through surgical limb with weight shift towards R, increased time and effort 07/13/22: 3-4 sec best Left 07/15/22: 2 MWT: 310 feet with 1 crutch 07/18/22: SLS -requires light touch 08/04/22: SLS 3-4 sec best  07/20/22:   5xSTS 14.18sec gentle UE support from thighs  TUG 12.83sec no AD, 13.28sec with unilat crutch  335ft unilat crutch (initial discomfort but improves with repetition)   08/16/22: 2 MWT : 452 feet with APC  5 x STS: 10.4 seconds , no UE, standard Mat    08/18/22:  - TUG ***  - 5xSTS: ***        GAIT: Distance walked: within clinic Assistive device utilized: Crutches Level of assistance: Modified independence Comments: hop to pattern, pt treats surgical limb mostly as NWB with occasional toe touch     TODAY'S TREATMENT:  [facility] Adult PT Treatment:                                                DATE: 08/18/22:  Therapeutic Exercise: *** Manual Therapy: *** Neuromuscular re-ed: *** Therapeutic Activity: *** Modalities: *** Self Care: ***   [facility]:                                                DATE: 08/16/22 Therapeutic Exercise: Elliptical Ramp 3 Resistance 2, 2.5 min each way  Wooden rocker Tandem stance on AIREX SLS on floor 4  sec best  Stepping on and off AIREX without [place] Marching on AIREX without UE    Therapeutic Activity: 2 MWT 5 x STS   [facility] Adult PT Treatment:                                                DATE: 08/11/22 Therapeutic Exercise: Seated towel slides EV, INV Seated  red band Inv, ev, DF Seated baps L2  CW and CCW circles Elliptical L1 2.5 min forward and reverse, 5 min total Wooden rocker A/P x 1 min Stepping on and off AIREX without UE Hip abduction with SLS and light touch SLS trial 4 sec best  Bilat heel raises Gastroc stretch Manual Therapy: Grade # AP and PA mobs to the L talocrural jt    [facility] Adult PT Treatment:                                                DATE: 08/09/22 Manual Therapy: Grade # AP and PA mobs to the L talocrural jt Contract/relax stretch for PFs Therapeutic Exercise: Rec Bike 5 min L3 Ankle DF c RTB 3x10 Wooden Rocker A/P 2 x 1 min Heel raise/ toe raise  standing Standing gastroc and soleus stretches on I" rise Slant board stretch  Lunge L foot onto Bosu ball 2x10  [facility] Adult PT Treatment:                                                DATE: 08/06/22 Therapeutic Exercise: Leg press 35# single leg  Wooden Rocker A/P x 1 min Heel raise/ toe raise  standing Standing gastroc and soleus stretches  Slant board stretch  STS x 15 , low chair  8 inch step up x 10 Elliptical L1 x 5 minutes (half forward, half backward)   Neuromuscular re-ed: Tandem stance  Tandem stance on AIREX SLS 4 sec best  Therapeutic Activity: Stairs negotiation , needs UE to descend reciprocally.  Mobs with movement x 30 strap and step    PATIENT EDUCATION:  Education details: rationale for interventions, safety w/ mobility, progress note Person educated: Patient Education method: Explanation, Demonstration, Tactile cues, Verbal cues, and Handouts Education comprehension: verbalized understanding, returned demonstration, verbal cues required, tactile cues required, and needs further education     HOME EXERCISE PROGRAM: Access Code: [email or web address] URL: [email or web address] Date: 06/17/2022 Prepared by: Joellyn Rued  Exercises - Seated Calf Stretch with Strap  - 2 x daily - 7 x weekly - 1-3 sets - 1-3 reps - 30sec hold -  Supine Ankle Dorsiflexion and Plantarflexion AROM  - 2 x daily - 7 x weekly - 2 sets - 10 reps - Supine Ankle Circles  - 2 x daily - 7 x weekly - 2 sets - 10 reps - Seated Heel Slide  - 2 x daily - 7 x weekly - 2 sets - 10 reps - 5 hold - Standing Anterior Posterior Weight Shift with Chair  - 2 x daily - 7 x weekly - 2 sets - 10 reps - 5 hold - Standing Weight Shift Side to Side  -  2 x daily - 7 x weekly - 2 sets - 10 reps - 5 hold   ASSESSMENT:   CLINICAL IMPRESSION: 08/17/2022 ***  *** Independent with lower body dressing. LTG# 6 met. She is ambulating >500 feet with SPC and no increased pain, maintains good mechanics with SPC. LTG# 3 met. She is at the end of her POC however will see the surgeon 7/31. She is progressing toward remaining LTGS. She is limited by DF AROM and will likely discuss potential hardware removal with MD to allow recovery of ROM and full functional activity. Discussed possibility of Holding PT until after MD appt. Will see primary PT next visit to discuss and reassess progress.  Pt tolerated PT today without adverse effects. Pt will continue to benefit from skilled PT to address impairments for improved function.  OBJECTIVE IMPAIRMENTS: Abnormal gait, decreased activity tolerance, decreased balance, decreased endurance, decreased mobility, difficulty walking, decreased ROM, decreased strength, and pain.    ACTIVITY LIMITATIONS: carrying, lifting, bending, standing, squatting, stairs, transfers, and locomotion level   PARTICIPATION LIMITATIONS: meal prep, cleaning, laundry, shopping, and community activity   PERSONAL FACTORS: Time since onset of injury/illness/exacerbation are also affecting patient's functional outcome.    REHAB POTENTIAL: Good   CLINICAL DECISION MAKING: Stable/uncomplicated   EVALUATION COMPLEXITY: Low     GOALS: Goals reviewed with patient? No   SHORT TERM GOALS: Target date: 07/05/2022 Pt will demonstrate appropriate understanding and  performance of initially prescribed HEP in order to facilitate improved independence with management of symptoms.  Baseline: HEP provided on eval Goal status: MET   2. Pt will score greater than or equal to 52 on FOTO in order to demonstrate improved perception of function due to symptoms.            Baseline: 40 07/13/22: 57             Goal status: MET  LONG TERM GOALS: Target date: 08/17/2022 (updated 07/20/22) Pt will score 67 or greater on FOTO in order to demonstrate improved perception of function due to symptoms.  Baseline: 40 07/13/22: 57 07/20/22: 57  08/18/22: ***  Goal status: ONGOING    2.  Pt will demonstrate at least 0 degrees of ankle DF AROM in order to facilitate improved tolerance to functional movements such as walking/stairs.  Baseline: see ROM chart above 07/13/22: -5 07/20/22: -6 actively, -3 passively 08/09/22: -5 08/18/22: ***  Goal status: ONGOING    3.  Pt will be able to ambulate at least 579ft with LRAD and appropriate gait mechanics with less than 2pt increase in pain on NPS in order to promote improved independence w/ household navigation. Baseline: axillary crutches, treats surgical limb as NWB 07/13/22: ambulating household distances single UE AD 07/20/22: 372ft with  08/16/22: Nearly 500 feet in clinic with SPC, good mechanics , no increased pain Goal status: MET   4.  Pt will be able to perform 5 consecutive sit to stands without UE support, grossly symmetrical WB, less than 2 pt increase in pain for improved safety w/ transfers. Baseline: 1 repetition, altered mechanics and B UE support 07/20/22: able to perform w/o UE support, no pain, reduced WB through surgical limb noted 08/16/22: equal WB STS x 5 in 10.4 sec  Goal status:  MET    5. Pt will demonstrate appropriate performance of final prescribed HEP in order to facilitate improved self-management of symptoms post-discharge.             Baseline: initial HEP prescribed  07/20/22: good HEP adherence  reported   08/18/22: ***             Goal status: ONGOING      6. Pt will report ability to perform lower body dressing without assist for improved independence w/ ADLs.            Baseline: son assists w/ donning sock during session due to difficulty          07/20/22: improving   08/16/22: no longer requires assist            Goal status: MET  7. Pt will be able to perform 5xSTS in less than or equal to 10 sec in order to indicate reduced fall risk and improved functional mobility (MCID of 2.3sec, age cohort norm 6.2+/-1.3sec per Billie Ruddy et al 2007)   Baseline: 12 sec  08/16/22: 10.4 sec   08/18/22: ***   Goal status: NEARLY MET   8. Pt will be able to perform TUG in less than or equal to 10 sec in order to indicate reduced risk of falling (MDC ~3sec)  Baseline: 12.83sec no AD, altered mechanics  08/18/22: ***   Goal status: NEW 07/20/22      PLAN: updated 07/20/22   PT FREQUENCY: 2x/week   PT DURATION: 4 weeks   PLANNED INTERVENTIONS: Therapeutic exercises, Therapeutic activity, Neuromuscular re-education, Balance training, Gait training, Patient/Family education, Self Care, Joint mobilization, Stair training, Aquatic Therapy, Dry Needling, Electrical stimulation, Cryotherapy, Moist heat, Taping, Vasopneumatic device, Manual therapy, and Re-evaluation   PLAN FOR NEXT SESSION: continue working on functional mobility and WB tolerance, gait mechanics. Ankle mobility as able/tolerated. Needs Re-eval - sees MD on 7/31   Ashley Murrain PT, DPT 08/17/2022 7:59 AM

## 2022-08-18 ENCOUNTER — Ambulatory Visit: Payer: Self-pay | Admitting: Physical Therapy

## 2022-08-18 ENCOUNTER — Encounter: Payer: Self-pay | Admitting: Physical Therapy

## 2022-08-18 DIAGNOSIS — R600 Localized edema: Secondary | ICD-10-CM

## 2022-08-18 DIAGNOSIS — M6281 Muscle weakness (generalized): Secondary | ICD-10-CM

## 2022-08-18 DIAGNOSIS — M25572 Pain in left ankle and joints of left foot: Secondary | ICD-10-CM

## 2022-08-18 DIAGNOSIS — R2689 Other abnormalities of gait and mobility: Secondary | ICD-10-CM

## 2022-08-23 ENCOUNTER — Ambulatory Visit: Payer: Self-pay | Admitting: Physical Therapy

## 2022-09-09 NOTE — Therapy (Signed)
OUTPATIENT PHYSICAL THERAPY EVALUATION   Patient Name: Joanna Whitney MRN: [id number] DOB:[birth year], 42 y.o., female Today's Date: 09/12/2022    PCP: No PCP in chart   REFERRING PROVIDER: Teryl Lucy, MD   END OF SESSION:   PT End of Session - 09/12/22 0844     Visit Number 1    Number of Visits 13    Date for PT Re-Evaluation 11/07/22    Authorization Type self pay    PT Start Time 0845    PT Stop Time 0925    PT Time Calculation (min) 40 min    Activity Tolerance Patient tolerated treatment well;No increased pain    Behavior During Therapy WFL for tasks assessed/performed              Past Medical History:  Diagnosis Date   Ankle fracture    Left   GERD (gastroesophageal reflux disease)    Gestational diabetes    Hx of toxoplasmosis    Medical history non-contributory    Past Surgical History:  Procedure Laterality Date   NO PAST SURGERIES     ORIF ANKLE FRACTURE Left 03/03/2022   Procedure: OPEN REDUCTION INTERNAL FIXATION (ORIF) ANKLE FRACTURE;  Surgeon: Teryl Lucy, MD;  Location: [facility] OR;  Service: Orthopedics;  Laterality: Left;   SYNDESMOSIS REPAIR Left 03/03/2022   Procedure: SYNDESMOSIS REPAIR;  Surgeon: Teryl Lucy, MD;  Location: [facility] OR;  Service: Orthopedics;  Laterality: Left;   Patient Active Problem List   Diagnosis Date Noted   History of gestational diabetes 06/13/2017   Obesity (BMI 30-39.9) 03/09/2017   Language barrier 03/09/2017    REFERRING DIAG: S/P LEFT ANKLE ORIF DOS 03/03/22      THERAPY DIAG:  Other abnormalities of gait and mobility  Muscle weakness (generalized)  Stiffness of left ankle, not elsewhere classified  Rationale for Evaluation and Treatment Rehabilitation  ONSET DATE: 03/03/22 L ankle ORIF    SUBJECTIVE:    SUBJECTIVE STATEMENT: Pt arrives after recent PT discharge on 08/18/22, was seen for ankle pain and mobility deficits s/p L ankle ORIF on 03/03/22. Pt followed up with her surgeon post discharge,  states she was encouraged to continue to participate with PT, does not think she will need any hardware removal. Sees them again in 6 weeks. States she continues to feel better with time and exercises. Reports very minimal swelling. Still using SPC most of the time but does occasionally endorse forgetting to use it in the home.   Appreciate assistance from her son for interpreting.   Next MD visit: 6 weeks from eval     PERTINENT HISTORY: Unremarkable per chart review, pt states she occasionally has random palpitations but received cardiac work up which was reportedly unremarkable PAIN:  Are you having pain: 0/10 Best worst over past week: no pain over past week per pt    - aggravating factors: higher level balance, prolonged WB - Easing factors: rest, elevation   PRECAUTIONS: fall risk   WEIGHT BEARING RESTRICTIONS: None   FALLS:  Has patient fallen in last 6 months? Yes. Number of falls 1 fall, precipitating episode    LIVING ENVIRONMENT: 1 story 4 STE no rail Has axillary crutches and RW Lives w/ kids (4 years, 7 years, 56 years, 45 years old) and their father    OCCUPATION: Magazine features editor    PLOF: Independent   PATIENT GOALS: work on Development worker, international aid     NEXT MD VISIT: 6 weeks from eval    OBJECTIVE: (objective measures completed at initial  evaluation unless otherwise dated)   DIAGNOSTIC FINDINGS:  S/p ORIF L ankle 03/03/22; WBAT   PATIENT SURVEYS:  FOTO 64 current, 79 predicted    COGNITION: Overall cognitive status: Within functional limits for tasks assessed                         SENSATION: Denies sensory complaints    PALPATION: Tenderness distal gastroc/soleus complex   LOWER EXTREMITY ROM:   Active ROM Right eval Left eval  Hip flexion      Hip extension      Hip abduction      Hip adduction      Hip internal rotation      Hip external rotation      Knee flexion      Knee extension      Ankle dorsiflexion WFL A: -3  P: neutral   Ankle  plantarflexion WFL    Ankle inversion   12  Ankle eversion   0    (Blank rows = not tested) Comments: ROM painless    LOWER EXTREMITY MMT:   MMT Right eval Left eval  Knee flexion      Knee extension      Ankle dorsiflexion 5   5  Ankle plantarflexion      Ankle inversion  4 3+   Ankle eversion  5  5   (Blank rows = not tested) Comments:  MMT painless, scored within available ROM     FUNCTIONAL TESTS:  5xSTS: 10.33sec no UE support         GAIT: Distance walked: within clinic Assistive device utilized: cane  Level of assistance: mod I  Comments: reduced WB through LLE with and without SPC. Increased lateral trunk lean, partial step through pattern   09/12/22 FGA:  -Item 1 Gait Level Surface: mild impairment 2  -Item 2 Change in Gait Speed: moderate impairment 1  -Item 3 Gait with Horizontal Head Turns: moderate impairment 1  -Item 4 Gait with Vertical Head Turns: moderate impairment 1 -Item 5 Gait with Pivot Turn: mild impairment 2  -Item 6 Step Over Obstacle: Normal 3 -Item 7 Gait with Narrow Base of Support: moderate impairment 1  -Item 8 Gait with Eyes Closed: Normal 3            -Item 9 Ambulating Backwards: mild impairment 2  -Item 10 Steps: moderate impairment 1   Total: 17/30  * Score of <=22/30 indicates that patient is at increased risk for falls.      TODAY'S TREATMENT:  [facility] Adult PT Treatment:                                                DATE: 09/12/22 Therapeutic Exercise: Reinforcing HEP from recent POC  PATIENT EDUCATION:  Education details: rationale for interventions, informed consent, HEP, exam findings PT POC/goals Person educated: Patient Education method: Explanation, Demonstration, Tactile cues, Verbal cues, and Handouts Education comprehension: verbalized understanding, returned demonstration, verbal cues required, tactile cues required, and needs further education     HOME EXERCISE PROGRAM: Access Code: [email or web address]   ASSESSMENT:    CLINICAL IMPRESSION: 09/12/2022 Pt arrives w/o pain, endorses continued improvement since recent discharge, would really like to work more on higher level balance. Reports exercises have been going well. States she was encouraged by surgeon to continue with  PT to work on higher level mobility/balance. Pt continues to demonstrate gait impairments, higher level postural stability deficits, and limitations in ankle mobility/strength as outlined above. Functional Gait Assessment scored at 17/30 today which is indicative of fall risk (cutoff score </= 22/30). Recommend skilled PT to address these impairments with aim of improving safety/tolerance to mobility. No adverse events, reinforced continuation of HEP from recent POC. Pt departs today's session in no acute distress, all voiced questions/concerns addressed appropriately from PT perspective.      OBJECTIVE IMPAIRMENTS: Abnormal gait, decreased activity tolerance, decreased balance, decreased endurance, decreased mobility, difficulty walking, decreased ROM, decreased strength   ACTIVITY LIMITATIONS: carrying, lifting, bending, standing, squatting, stairs, transfers, and locomotion level   PARTICIPATION LIMITATIONS: meal prep, cleaning, laundry, shopping, and community activity   PERSONAL FACTORS: Time since onset of injury/illness/exacerbation are also affecting patient's functional outcome.    REHAB POTENTIAL: Good   CLINICAL DECISION MAKING: Stable/uncomplicated   EVALUATION COMPLEXITY: Low     GOALS: Goals reviewed with patient? No   SHORT TERM GOALS: Target date: 10/10/2022 Pt will demonstrate appropriate understanding and performance of initially prescribed HEP in order to facilitate improved independence with management of symptoms.  Baseline: HEP reviewed from recent POC  Goal status: INITIAL    2. Pt will score greater than or equal to 72 on FOTO in order to demonstrate improved perception of function due to symptoms.             Baseline: 64   Goal status: INITIAL   LONG TERM GOALS: Target date: 11/07/2022   Pt will score 79 or greater on FOTO in order to demonstrate improved perception of function due to symptoms.  Baseline: 64  Goal status: INITIAL    2.  Pt will demonstrate greater than 0 degrees of ankle DF AROM in order to facilitate improved tolerance to functional movements such as walking/stairs.  Baseline: see ROM chart above  Goal status: INITIAL    3.  Pt will score greater than or equal to 22/30 on Functional Gait assessment in order to indicate reduced fall risk (cutoff score </= 22/30 predictive of falls per Alvino Chapel et al 2010, MCID 4 pts Beninato et al 2014)  Baseline: 17/30  Goal status: INITIAL    4.  Pt will perform 5xSTS in </= 8 sec without UE support in order to demonstrate improved functional mobility (MCID 2.3sec) Baseline: 10 sec without UE support  Goal status: INITIAL    5. Pt will demonstrate appropriate performance of final prescribed HEP in order to facilitate improved self-management of symptoms post-discharge.             Baseline: HEP reviewed from recent POC   Goal status: INITIAL    6. Pt will be able to safely navigate 4 stairs without UE support and with reciprocal gait pattern in order to facilitate improved home access.  Baseline: requiring rail, step to pattern per pt report  Goal status: INITIAL       PLAN:    PT FREQUENCY: 1x/week   PT DURATION: 8 weeks   PLANNED INTERVENTIONS: Therapeutic exercises, Therapeutic activity, Neuromuscular re-education, Balance training, Gait training, Patient/Family education, Self Care, Joint mobilization, Stair training, Aquatic Therapy, Dry Needling, Electrical stimulation, Cryotherapy, Moist heat, Taping, Vasopneumatic device, Manual therapy, and Re-evaluation   PLAN FOR NEXT SESSION: review/update HEP PRN. Work on higher level balance, functional mobility. Would likely benefit from dual tasking, obstacle navigation. Specific  ankle mobility as able/tolerated   Ashley Murrain PT,  DPT 09/12/2022 11:33 AM

## 2022-09-12 ENCOUNTER — Ambulatory Visit: Payer: Self-pay | Attending: Orthopedic Surgery | Admitting: Physical Therapy

## 2022-09-12 ENCOUNTER — Encounter: Payer: Self-pay | Admitting: Physical Therapy

## 2022-09-12 ENCOUNTER — Other Ambulatory Visit: Payer: Self-pay

## 2022-09-12 DIAGNOSIS — R2689 Other abnormalities of gait and mobility: Secondary | ICD-10-CM | POA: Insufficient documentation

## 2022-09-12 DIAGNOSIS — M6281 Muscle weakness (generalized): Secondary | ICD-10-CM | POA: Insufficient documentation

## 2022-09-12 DIAGNOSIS — M25672 Stiffness of left ankle, not elsewhere classified: Secondary | ICD-10-CM | POA: Insufficient documentation

## 2022-09-21 ENCOUNTER — Ambulatory Visit: Payer: Self-pay

## 2022-09-23 ENCOUNTER — Telehealth: Payer: Self-pay

## 2022-09-23 NOTE — Telephone Encounter (Signed)
Contacted pt 's son by phone re: no show appt. Pt's son reports they misunderstood the appt time and were late. The pt is planning to attend her next PT appt.

## 2022-09-27 ENCOUNTER — Encounter: Payer: Self-pay | Admitting: Physical Therapy

## 2022-09-28 ENCOUNTER — Ambulatory Visit: Payer: Self-pay | Admitting: Physical Therapy

## 2022-09-29 NOTE — Therapy (Signed)
OUTPATIENT PHYSICAL THERAPY TREATMENT   Patient Name: Joanna Whitney MRN: [id number] DOB:Aug 30, 1980, 42 y.o., female Today's Date: 09/30/2022    PCP: No PCP in chart   REFERRING PROVIDER: Teryl Lucy, MD   END OF SESSION:   PT End of Session - 09/30/22 0714     Visit Number 2    Number of Visits 13    Date for PT Re-Evaluation 11/07/22    Authorization Type self pay    PT Start Time 0716    PT Stop Time 0756    PT Time Calculation (min) 40 min    Activity Tolerance Patient tolerated treatment well;No increased pain    Behavior During Therapy WFL for tasks assessed/performed               Past Medical History:  Diagnosis Date   Ankle fracture    Left   GERD (gastroesophageal reflux disease)    Gestational diabetes    Hx of toxoplasmosis    Medical history non-contributory    Past Surgical History:  Procedure Laterality Date   NO PAST SURGERIES     ORIF ANKLE FRACTURE Left 03/03/2022   Procedure: OPEN REDUCTION INTERNAL FIXATION (ORIF) ANKLE FRACTURE;  Surgeon: Teryl Lucy, MD;  Location: [facility] OR;  Service: Orthopedics;  Laterality: Left;   SYNDESMOSIS REPAIR Left 03/03/2022   Procedure: SYNDESMOSIS REPAIR;  Surgeon: Teryl Lucy, MD;  Location: [facility] OR;  Service: Orthopedics;  Laterality: Left;   Patient Active Problem List   Diagnosis Date Noted   History of gestational diabetes 06/13/2017   Obesity (BMI 30-39.9) 03/09/2017   Language barrier 03/09/2017    REFERRING DIAG: S/P LEFT ANKLE ORIF DOS 03/03/22      THERAPY DIAG:  Other abnormalities of gait and mobility  Muscle weakness (generalized)  Rationale for Evaluation and Treatment Rehabilitation  ONSET DATE: 03/03/22 L ankle ORIF    SUBJECTIVE:   Per eval - Pt arrives after recent PT discharge on 08/18/22, was seen for ankle pain and mobility deficits s/p L ankle ORIF on 03/03/22. Pt followed up with her surgeon post discharge, states she was encouraged to continue to participate with PT, does  not think she will need any hardware removal. Sees them again in 6 weeks. States she continues to feel better with time and exercises. Reports very minimal swelling. Still using SPC most of the time but does occasionally endorse forgetting to use it in the home.   SUBJECTIVE STATEMENT: 09/30/2022 Pt endorses about 2-3/10 pain in L heel this morning, unsure of provocative factor. Overall still feeling slow/steady improvement. Good HEP adherence reported.   Appreciate assistance from her son for interpreting.   Next MD visit: 6 weeks from eval     PERTINENT HISTORY: Unremarkable per chart review, pt states she occasionally has random palpitations but received cardiac work up which was reportedly unremarkable PAIN:  Are you having pain: 2-3/10 L heel   Per eval -  Best worst over past week: no pain over past week per pt    - aggravating factors: higher level balance, prolonged WB - Easing factors: rest, elevation   PRECAUTIONS: fall risk   WEIGHT BEARING RESTRICTIONS: None   FALLS:  Has patient fallen in last 6 months? Yes. Number of falls 1 fall, precipitating episode    LIVING ENVIRONMENT: 1 story 4 STE no rail Has axillary crutches and RW Lives w/ kids (4 years, 7 years, 60 years, 33 years old) and their father    OCCUPATION: Magazine features editor  PLOF: Independent   PATIENT GOALS: work on Development worker, international aid     NEXT MD VISIT: 6 weeks from eval    OBJECTIVE: (objective measures completed at initial evaluation unless otherwise dated)   DIAGNOSTIC FINDINGS:  S/p ORIF L ankle 03/03/22; WBAT   PATIENT SURVEYS:  FOTO 64 current, 79 predicted    COGNITION: Overall cognitive status: Within functional limits for tasks assessed                         SENSATION: Denies sensory complaints    PALPATION: Tenderness distal gastroc/soleus complex   LOWER EXTREMITY ROM:   Active ROM Right eval Left eval  Hip flexion      Hip extension      Hip abduction      Hip adduction       Hip internal rotation      Hip external rotation      Knee flexion      Knee extension      Ankle dorsiflexion WFL A: -3  P: neutral   Ankle plantarflexion WFL    Ankle inversion   12  Ankle eversion   0    (Blank rows = not tested) Comments: ROM painless    LOWER EXTREMITY MMT:   MMT Right eval Left eval  Knee flexion      Knee extension      Ankle dorsiflexion 5   5  Ankle plantarflexion      Ankle inversion  4 3+   Ankle eversion  5  5   (Blank rows = not tested) Comments:  MMT painless, scored within available ROM     FUNCTIONAL TESTS:  5xSTS: 10.33sec no UE support         GAIT: Distance walked: within clinic Assistive device utilized: cane  Level of assistance: mod I  Comments: reduced WB through LLE with and without SPC. Increased lateral trunk lean, partial step through pattern   09/12/22 FGA:  -Item 1 Gait Level Surface: mild impairment 2  -Item 2 Change in Gait Speed: moderate impairment 1  -Item 3 Gait with Horizontal Head Turns: moderate impairment 1  -Item 4 Gait with Vertical Head Turns: moderate impairment 1 -Item 5 Gait with Pivot Turn: mild impairment 2  -Item 6 Step Over Obstacle: Normal 3 -Item 7 Gait with Narrow Base of Support: moderate impairment 1  -Item 8 Gait with Eyes Closed: Normal 3            -Item 9 Ambulating Backwards: mild impairment 2  -Item 10 Steps: moderate impairment 1   Total: 17/30  * Score of <=22/30 indicates that patient is at increased risk for falls.      TODAY'S TREATMENT:  [facility] Adult PT Treatment:                                                DATE: 09/30/22 Therapeutic Exercise: Recumbent bike warm up 5 min Slantboard gastroc stretch 3x30sec Slantboard soleus stretch 3x30sec Standing heel raises 2x8  HEP update + education   Neuromuscular re-ed: Obstacle course for dynamic postural stability(2 hurdles, airex stepover, pool noodle stepovers, 4 cone weaving) 4 laps CGA (untimed, 24 sec, 21 sec,  20sec) Hurdle stepovers 6 hurdles, 3 laps cues for postural stability, pacing, and ankle stability Tandem walk fwd + retro walk (regular BOS) 62ft  walkway, CGA Static stance on airex + 1kg ball reaches cross body x8    [facility] Adult PT Treatment:                                                DATE: 09/12/22 Therapeutic Exercise: Reinforcing HEP from recent POC  PATIENT EDUCATION:  Education details: rationale for interventions, HEP Person educated: Patient Education method: Explanation, Demonstration, Tactile cues, Verbal cues, and Handouts Education comprehension: verbalized understanding, returned demonstration, verbal cues required, tactile cues required, and needs further education     HOME EXERCISE PROGRAM: Access Code: [email or web address] URL: [email or web address] Date: 09/30/2022 Prepared by: Fransisco Hertz  Exercises - Seated Toe Towel Scrunches  - 1 x daily - 7 x weekly - 1-2 sets - 10 reps - 5 hold - Standing Gastroc Stretch  - 1 x daily - 7 x weekly - 1 sets - 10 reps - 5 hold - Standing Soleus Stretch  - 1 x daily - 7 x weekly - 1 sets - 10 reps - 5 hold - Mini Squat with Counter Support  - 1 x daily - 7 x weekly - 1-2 sets - 10 reps - Tandem Walking with Counter Support  - 1 x daily - 7 x weekly - 1-2 sets - 10 reps - Backward Walking with Counter Support  - 1 x daily - 7 x weekly - 1-2 sets - 10 reps   ASSESSMENT:   CLINICAL IMPRESSION: 09/30/2022 Pt arrives w/ report of 2-3/10 pain in R heel, no new issues since last visit. Today we are continuing to progress static and dynamic postural stability as above, and continuing to work on ankle mobility as able. Cues as above. Pt tolerates session well without adverse event report of gradually resolving pain. Noted improvement in tandem walk compared to last assessment. Recommend continuing along current POC in order to address relevant deficits and improve functional tolerance. Pt departs today's session in no acute distress,  all voiced questions/concerns addressed appropriately from PT perspective.     Per eval - Pt arrives w/o pain, endorses continued improvement since recent discharge, would really like to work more on higher level balance. Reports exercises have been going well. States she was encouraged by surgeon to continue with PT to work on higher level mobility/balance. Pt continues to demonstrate gait impairments, higher level postural stability deficits, and limitations in ankle mobility/strength as outlined above. Functional Gait Assessment scored at 17/30 today which is indicative of fall risk (cutoff score </= 22/30). Recommend skilled PT to address these impairments with aim of improving safety/tolerance to mobility. No adverse events, reinforced continuation of HEP from recent POC. Pt departs today's session in no acute distress, all voiced questions/concerns addressed appropriately from PT perspective.      OBJECTIVE IMPAIRMENTS: Abnormal gait, decreased activity tolerance, decreased balance, decreased endurance, decreased mobility, difficulty walking, decreased ROM, decreased strength   ACTIVITY LIMITATIONS: carrying, lifting, bending, standing, squatting, stairs, transfers, and locomotion level   PARTICIPATION LIMITATIONS: meal prep, cleaning, laundry, shopping, and community activity   PERSONAL FACTORS: Time since onset of injury/illness/exacerbation are also affecting patient's functional outcome.    REHAB POTENTIAL: Good   CLINICAL DECISION MAKING: Stable/uncomplicated   EVALUATION COMPLEXITY: Low     GOALS: Goals reviewed with patient? No   SHORT TERM GOALS: Target date: 10/10/2022 Pt will demonstrate appropriate understanding and performance of  initially prescribed HEP in order to facilitate improved independence with management of symptoms.  Baseline: HEP reviewed from recent POC  Goal status: INITIAL    2. Pt will score greater than or equal to 72 on FOTO in order to demonstrate  improved perception of function due to symptoms.            Baseline: 64   Goal status: INITIAL   LONG TERM GOALS: Target date: 11/07/2022   Pt will score 79 or greater on FOTO in order to demonstrate improved perception of function due to symptoms.  Baseline: 64  Goal status: INITIAL    2.  Pt will demonstrate greater than 0 degrees of ankle DF AROM in order to facilitate improved tolerance to functional movements such as walking/stairs.  Baseline: see ROM chart above  Goal status: INITIAL    3.  Pt will score greater than or equal to 22/30 on Functional Gait assessment in order to indicate reduced fall risk (cutoff score </= 22/30 predictive of falls per Alvino Chapel et al 2010, [facility] 4 pts Beninato et al 2014)  Baseline: 17/30  Goal status: INITIAL    4.  Pt will perform 5xSTS in </= 8 sec without UE support in order to demonstrate improved functional mobility ([facility] 2.3sec) Baseline: 10 sec without UE support  Goal status: INITIAL    5. Pt will demonstrate appropriate performance of final prescribed HEP in order to facilitate improved self-management of symptoms post-discharge.             Baseline: HEP reviewed from recent POC   Goal status: INITIAL    6. Pt will be able to safely navigate 4 stairs without UE support and with reciprocal gait pattern in order to facilitate improved home access.  Baseline: requiring rail, step to pattern per pt report  Goal status: INITIAL       PLAN:    PT FREQUENCY: 1x/week   PT DURATION: 8 weeks   PLANNED INTERVENTIONS: Therapeutic exercises, Therapeutic activity, Neuromuscular re-education, Balance training, Gait training, Patient/Family education, Self Care, Joint mobilization, Stair training, Aquatic Therapy, Dry Needling, Electrical stimulation, Cryotherapy, Moist heat, Taping, Vasopneumatic device, Manual therapy, and Re-evaluation   PLAN FOR NEXT SESSION: review/update HEP PRN. Work on higher level balance, functional mobility. Would  likely benefit from dual tasking, obstacle navigation. Specific ankle mobility as able/tolerated     Ashley Murrain PT, DPT 09/30/2022 9:04 AM

## 2022-09-30 ENCOUNTER — Encounter: Payer: Self-pay | Admitting: Physical Therapy

## 2022-09-30 ENCOUNTER — Ambulatory Visit: Payer: Self-pay | Attending: Orthopedic Surgery | Admitting: Physical Therapy

## 2022-09-30 DIAGNOSIS — M6281 Muscle weakness (generalized): Secondary | ICD-10-CM | POA: Insufficient documentation

## 2022-09-30 DIAGNOSIS — R2689 Other abnormalities of gait and mobility: Secondary | ICD-10-CM | POA: Insufficient documentation

## 2022-09-30 DIAGNOSIS — M25672 Stiffness of left ankle, not elsewhere classified: Secondary | ICD-10-CM | POA: Insufficient documentation

## 2022-10-04 ENCOUNTER — Encounter: Payer: Self-pay | Admitting: Physical Therapy

## 2022-10-04 NOTE — Therapy (Signed)
OUTPATIENT PHYSICAL THERAPY TREATMENT   Patient Name: Joanna Whitney MRN: [id number] DOB:12-Jan-1981, 42 y.o., female Today's Date: 10/05/2022    PCP: No PCP in chart   REFERRING PROVIDER: Teryl Lucy, MD   END OF SESSION:   PT End of Session - 10/05/22 0847     Visit Number 3    Number of Visits 13    Date for PT Re-Evaluation 11/07/22    Authorization Type self pay    PT Start Time 0848    PT Stop Time 0932    PT Time Calculation (min) 44 min    Activity Tolerance Patient tolerated treatment well;No increased pain    Behavior During Therapy WFL for tasks assessed/performed                Past Medical History:  Diagnosis Date   Ankle fracture    Left   GERD (gastroesophageal reflux disease)    Gestational diabetes    Hx of toxoplasmosis    Medical history non-contributory    Past Surgical History:  Procedure Laterality Date   NO PAST SURGERIES     ORIF ANKLE FRACTURE Left 03/03/2022   Procedure: OPEN REDUCTION INTERNAL FIXATION (ORIF) ANKLE FRACTURE;  Surgeon: Teryl Lucy, MD;  Location: [facility] OR;  Service: Orthopedics;  Laterality: Left;   SYNDESMOSIS REPAIR Left 03/03/2022   Procedure: SYNDESMOSIS REPAIR;  Surgeon: Teryl Lucy, MD;  Location: [facility] OR;  Service: Orthopedics;  Laterality: Left;   Patient Active Problem List   Diagnosis Date Noted   History of gestational diabetes 06/13/2017   Obesity (BMI 30-39.9) 03/09/2017   Language barrier 03/09/2017    REFERRING DIAG: S/P LEFT ANKLE ORIF DOS 03/03/22      THERAPY DIAG:  Other abnormalities of gait and mobility  Muscle weakness (generalized)  Rationale for Evaluation and Treatment Rehabilitation  ONSET DATE: 03/03/22 L ankle ORIF    SUBJECTIVE:   Per eval - Pt arrives after recent PT discharge on 08/18/22, was seen for ankle pain and mobility deficits s/p L ankle ORIF on 03/03/22. Pt followed up with her surgeon post discharge, states she was encouraged to continue to participate with PT,  does not think she will need any hardware removal. Sees them again in 6 weeks. States she continues to feel better with time and exercises. Reports very minimal swelling. Still using SPC most of the time but does occasionally endorse forgetting to use it in the home.   SUBJECTIVE STATEMENT: 10/05/2022 Pt states she felt good after last session, still having a little bit of pain in the mornings and with cooler weather. HEP still going well, no other new updates. No pain at present.  Appreciate assistance from her son for interpreting.   Next MD visit: 6 weeks from eval     PERTINENT HISTORY: Unremarkable per chart review, pt states she occasionally has random palpitations but received cardiac work up which was reportedly unremarkable PAIN:  Are you having pain: no pain  Per eval -  Best worst over past week: no pain over past week per pt    - aggravating factors: higher level balance, prolonged WB - Easing factors: rest, elevation   PRECAUTIONS: fall risk   WEIGHT BEARING RESTRICTIONS: None   FALLS:  Has patient fallen in last 6 months? Yes. Number of falls 1 fall, precipitating episode    LIVING ENVIRONMENT: 1 story 4 STE no rail Has axillary crutches and RW Lives w/ kids (4 years, 7 years, 45 years, 68 years old) and their  father    OCCUPATION: Magazine features editor    PLOF: Independent   PATIENT GOALS: work on Development worker, international aid     NEXT MD VISIT: 6 weeks from eval    OBJECTIVE: (objective measures completed at initial evaluation unless otherwise dated)   DIAGNOSTIC FINDINGS:  S/p ORIF L ankle 03/03/22; WBAT   PATIENT SURVEYS:  FOTO 64 current, 79 predicted    COGNITION: Overall cognitive status: Within functional limits for tasks assessed                         SENSATION: Denies sensory complaints    PALPATION: Tenderness distal gastroc/soleus complex   LOWER EXTREMITY ROM:   Active ROM Right eval Left eval  Hip flexion      Hip extension      Hip abduction       Hip adduction      Hip internal rotation      Hip external rotation      Knee flexion      Knee extension      Ankle dorsiflexion WFL A: -3  P: neutral   Ankle plantarflexion WFL    Ankle inversion   12  Ankle eversion   0    (Blank rows = not tested) Comments: ROM painless    LOWER EXTREMITY MMT:   MMT Right eval Left eval  Knee flexion      Knee extension      Ankle dorsiflexion 5   5  Ankle plantarflexion      Ankle inversion  4 3+   Ankle eversion  5  5   (Blank rows = not tested) Comments:  MMT painless, scored within available ROM     FUNCTIONAL TESTS:  5xSTS: 10.33sec no UE support         GAIT: Distance walked: within clinic Assistive device utilized: cane  Level of assistance: mod I  Comments: reduced WB through LLE with and without SPC. Increased lateral trunk lean, partial step through pattern   09/12/22 FGA:  -Item 1 Gait Level Surface: mild impairment 2  -Item 2 Change in Gait Speed: moderate impairment 1  -Item 3 Gait with Horizontal Head Turns: moderate impairment 1  -Item 4 Gait with Vertical Head Turns: moderate impairment 1 -Item 5 Gait with Pivot Turn: mild impairment 2  -Item 6 Step Over Obstacle: Normal 3 -Item 7 Gait with Narrow Base of Support: moderate impairment 1  -Item 8 Gait with Eyes Closed: Normal 3            -Item 9 Ambulating Backwards: mild impairment 2  -Item 10 Steps: moderate impairment 1   Total: 17/30  * Score of <=22/30 indicates that patient is at increased risk for falls.      TODAY'S TREATMENT:  [facility] Adult PT Treatment:                                                DATE: 10/05/22 Therapeutic Exercise: Recumbent bike warm up  Standing heel raise 2x10 Seated heel raise 2x8 cues for form and appropriate joint angles  Seated inversion iso with ball 2x12 cues for pacing and reduced compensations at hip  Seated red band eversion 2x12 cues for setup and reduced knee compensations Slantboard gastroc stretch x8  gentle rocking LLE cues for comfortable ROM Slantboard soleus stretch x8 gentle  rocking LLE only HEP update/education  Neuromuscular re-ed: SLS LLE to fatigue (14sec) CGA for single limb stability 6 inch step up LLE leading x10 for dynamic single limb stability, cues for pacing, mechanics, and control, CGA Runners step up, LLE stance 6 inch step; 2x5 cues for single limb stability, posture, and CGA for safety Slow karaokes along counter 4 laps total each direction cues for sequencing, stability, and pacing    [facility] Adult PT Treatment:                                                DATE: 09/30/22 Therapeutic Exercise: Recumbent bike warm up 5 min Slantboard gastroc stretch 3x30sec Slantboard soleus stretch 3x30sec Standing heel raises 2x8  HEP update + education   Neuromuscular re-ed: Obstacle course for dynamic postural stability(2 hurdles, airex stepover, pool noodle stepovers, 4 cone weaving) 4 laps CGA (untimed, 24 sec, 21 sec, 20sec) Hurdle stepovers 6 hurdles, 3 laps cues for postural stability, pacing, and ankle stability Tandem walk fwd + retro walk (regular BOS) 35ft walkway, CGA Static stance on airex + 1kg ball reaches cross body x8    [facility] Adult PT Treatment:                                                DATE: 09/12/22 Therapeutic Exercise: Reinforcing HEP from recent POC  PATIENT EDUCATION:  Education details: rationale for interventions, HEP Person educated: Patient Education method: Explanation, Demonstration, Tactile cues, Verbal cues, and Handouts Education comprehension: verbalized understanding, returned demonstration, verbal cues required, tactile cues required, and needs further education     HOME EXERCISE PROGRAM: Access Code: [email or web address] URL: [email or web address] Date: 10/05/2022 Prepared by: Fransisco Hertz  Exercises - Seated Toe Towel Scrunches  - 1 x daily - 7 x weekly - 1-2 sets - 10 reps - 5 hold - Standing Gastroc Stretch  - 1 x daily  - 7 x weekly - 1 sets - 10 reps - 5 hold - Standing Soleus Stretch  - 1 x daily - 7 x weekly - 1 sets - 10 reps - 5 hold - Mini Squat with Counter Support  - 1 x daily - 7 x weekly - 1-2 sets - 10 reps - Tandem Walking with Counter Support  - 1 x daily - 7 x weekly - 1-2 sets - 10 reps - Backward Walking with Counter Support  - 1 x daily - 7 x weekly - 1-2 sets - 10 reps - Seated Heel Raise  - 1 x daily - 7 x weekly - 1-2 sets - 10 reps - Carioca with Counter Support  - 1 x daily - 7 x weekly - 1-2 sets - 3 reps   ASSESSMENT:   CLINICAL IMPRESSION: 10/05/2022 Pt arrives w/o pain, good response to last session. Today we continue to progress direct ankle strengthening in open and closed chain and increase complexity of postural stability training. Most difficulty with exercises with knee in flexed position. She does well with single leg stance and step ups, so we are able to progress to runner's step ups - does have transient, mild pain on one rep but overall does quite well with close CGA for safety. Pt reports feeling much improved on departure,  no pain, HEP update as above. No adverse events. Recommend continuing along current POC in order to address relevant deficits and improve functional tolerance. Pt departs today's session in no acute distress, all voiced questions/concerns addressed appropriately from PT perspective.    Per eval - Pt arrives w/o pain, endorses continued improvement since recent discharge, would really like to work more on higher level balance. Reports exercises have been going well. States she was encouraged by surgeon to continue with PT to work on higher level mobility/balance. Pt continues to demonstrate gait impairments, higher level postural stability deficits, and limitations in ankle mobility/strength as outlined above. Functional Gait Assessment scored at 17/30 today which is indicative of fall risk (cutoff score </= 22/30). Recommend skilled PT to address these  impairments with aim of improving safety/tolerance to mobility. No adverse events, reinforced continuation of HEP from recent POC. Pt departs today's session in no acute distress, all voiced questions/concerns addressed appropriately from PT perspective.      OBJECTIVE IMPAIRMENTS: Abnormal gait, decreased activity tolerance, decreased balance, decreased endurance, decreased mobility, difficulty walking, decreased ROM, decreased strength   ACTIVITY LIMITATIONS: carrying, lifting, bending, standing, squatting, stairs, transfers, and locomotion level   PARTICIPATION LIMITATIONS: meal prep, cleaning, laundry, shopping, and community activity   PERSONAL FACTORS: Time since onset of injury/illness/exacerbation are also affecting patient's functional outcome.    REHAB POTENTIAL: Good   CLINICAL DECISION MAKING: Stable/uncomplicated   EVALUATION COMPLEXITY: Low     GOALS: Goals reviewed with patient? No   SHORT TERM GOALS: Target date: 10/10/2022 Pt will demonstrate appropriate understanding and performance of initially prescribed HEP in order to facilitate improved independence with management of symptoms.  Baseline: HEP reviewed from recent POC  10/05/22: pt reports good HEP adherence Goal status: MET    2. Pt will score greater than or equal to 72 on FOTO in order to demonstrate improved perception of function due to symptoms.            Baseline: 64   10/05/22: follow up FOTO deferred as today is only visit 3  Goal status: ONGOING  LONG TERM GOALS: Target date: 11/07/2022   Pt will score 79 or greater on FOTO in order to demonstrate improved perception of function due to symptoms.  Baseline: 64  Goal status: INITIAL    2.  Pt will demonstrate greater than 0 degrees of ankle DF AROM in order to facilitate improved tolerance to functional movements such as walking/stairs.  Baseline: see ROM chart above  Goal status: INITIAL    3.  Pt will score greater than or equal to 22/30 on  Functional Gait assessment in order to indicate reduced fall risk (cutoff score </= 22/30 predictive of falls per Alvino Chapel et al 2010, [facility] 4 pts Beninato et al 2014)  Baseline: 17/30  Goal status: INITIAL    4.  Pt will perform 5xSTS in </= 8 sec without UE support in order to demonstrate improved functional mobility ([facility] 2.3sec) Baseline: 10 sec without UE support  Goal status: INITIAL    5. Pt will demonstrate appropriate performance of final prescribed HEP in order to facilitate improved self-management of symptoms post-discharge.             Baseline: HEP reviewed from recent POC   Goal status: INITIAL    6. Pt will be able to safely navigate 4 stairs without UE support and with reciprocal gait pattern in order to facilitate improved home access.  Baseline: requiring rail, step to pattern per  pt report  Goal status: INITIAL       PLAN:    PT FREQUENCY: 1x/week   PT DURATION: 8 weeks   PLANNED INTERVENTIONS: Therapeutic exercises, Therapeutic activity, Neuromuscular re-education, Balance training, Gait training, Patient/Family education, Self Care, Joint mobilization, Stair training, Aquatic Therapy, Dry Needling, Electrical stimulation, Cryotherapy, Moist heat, Taping, Vasopneumatic device, Manual therapy, and Re-evaluation   PLAN FOR NEXT SESSION: review/update HEP PRN. Work on higher level balance, functional mobility. Would likely benefit from dual tasking, obstacle navigation. Specific ankle mobility as able/tolerated     Ashley Murrain PT, DPT 10/05/2022 9:40 AM

## 2022-10-05 ENCOUNTER — Encounter: Payer: Self-pay | Admitting: Physical Therapy

## 2022-10-05 ENCOUNTER — Ambulatory Visit: Payer: Self-pay | Admitting: Physical Therapy

## 2022-10-05 DIAGNOSIS — R2689 Other abnormalities of gait and mobility: Secondary | ICD-10-CM

## 2022-10-05 DIAGNOSIS — M6281 Muscle weakness (generalized): Secondary | ICD-10-CM

## 2022-10-11 ENCOUNTER — Encounter: Payer: Self-pay | Admitting: Physical Therapy

## 2022-10-11 NOTE — Therapy (Signed)
OUTPATIENT PHYSICAL THERAPY TREATMENT   Patient Name: Joanna Whitney MRN: [id number] DOB:February 17, 1980, 42 y.o., female Today's Date: 10/12/2022    PCP: No PCP in chart   REFERRING PROVIDER: Teryl Lucy, MD   END OF SESSION:   PT End of Session - 10/12/22 0843     Visit Number 4    Number of Visits 13    Date for PT Re-Evaluation 11/07/22    Authorization Type self pay    PT Start Time 0845    PT Stop Time 0930    PT Time Calculation (min) 45 min    Activity Tolerance Patient tolerated treatment well;No increased pain    Behavior During Therapy WFL for tasks assessed/performed             Past Medical History:  Diagnosis Date   Ankle fracture    Left   GERD (gastroesophageal reflux disease)    Gestational diabetes    Hx of toxoplasmosis    Medical history non-contributory    Past Surgical History:  Procedure Laterality Date   NO PAST SURGERIES     ORIF ANKLE FRACTURE Left 03/03/2022   Procedure: OPEN REDUCTION INTERNAL FIXATION (ORIF) ANKLE FRACTURE;  Surgeon: Teryl Lucy, MD;  Location: [facility] OR;  Service: Orthopedics;  Laterality: Left;   SYNDESMOSIS REPAIR Left 03/03/2022   Procedure: SYNDESMOSIS REPAIR;  Surgeon: Teryl Lucy, MD;  Location: [facility] OR;  Service: Orthopedics;  Laterality: Left;   Patient Active Problem List   Diagnosis Date Noted   History of gestational diabetes 06/13/2017   Obesity (BMI 30-39.9) 03/09/2017   Language barrier 03/09/2017    REFERRING DIAG: S/P LEFT ANKLE ORIF DOS 03/03/22      THERAPY DIAG:  Other abnormalities of gait and mobility  Muscle weakness (generalized)  Stiffness of left ankle, not elsewhere classified  Rationale for Evaluation and Treatment Rehabilitation  ONSET DATE: 03/03/22 L ankle ORIF    SUBJECTIVE:   Per eval - Pt arrives after recent PT discharge on 08/18/22, was seen for ankle pain and mobility deficits s/p L ankle ORIF on 03/03/22. Pt followed up with her surgeon post discharge, states she was  encouraged to continue to participate with PT, does not think she will need any hardware removal. Sees them again in 6 weeks. States she continues to feel better with time and exercises. Reports very minimal swelling. Still using SPC most of the time but does occasionally endorse forgetting to use it in the home.   SUBJECTIVE STATEMENT: 10/12/2022 Pt states she had a little lateral soreness after last session, back to baseline next day. HEP going well. No other new updates, continues to endorse steady improvement.   Appreciate assistance from her son for interpreting.   Next MD visit: PRN per pt report     PERTINENT HISTORY: Unremarkable per chart review, pt states she occasionally has random palpitations but received cardiac work up which was reportedly unremarkable PAIN:  Are you having pain: no pain  Per eval -  Best worst over past week: no pain over past week per pt    - aggravating factors: higher level balance, prolonged WB - Easing factors: rest, elevation   PRECAUTIONS: fall risk   WEIGHT BEARING RESTRICTIONS: None   FALLS:  Has patient fallen in last 6 months? Yes. Number of falls 1 fall, precipitating episode    LIVING ENVIRONMENT: 1 story 4 STE no rail Has axillary crutches and RW Lives w/ kids (4 years, 7 years, 24 years, 32 years old) and their  father    OCCUPATION: Magazine features editor    PLOF: Independent   PATIENT GOALS: work on Development worker, international aid     NEXT MD VISIT: 6 weeks from eval    OBJECTIVE: (objective measures completed at initial evaluation unless otherwise dated)   DIAGNOSTIC FINDINGS:  S/p ORIF L ankle 03/03/22; WBAT   PATIENT SURVEYS:  FOTO 64 current, 79 predicted    COGNITION: Overall cognitive status: Within functional limits for tasks assessed                         SENSATION: Denies sensory complaints    PALPATION: Tenderness distal gastroc/soleus complex   LOWER EXTREMITY ROM:   Active ROM Right eval Left eval  Hip flexion      Hip  extension      Hip abduction      Hip adduction      Hip internal rotation      Hip external rotation      Knee flexion      Knee extension      Ankle dorsiflexion WFL A: -3  P: neutral   Ankle plantarflexion WFL    Ankle inversion   12  Ankle eversion   0    (Blank rows = not tested) Comments: ROM painless    LOWER EXTREMITY MMT:   MMT Right eval Left eval  Knee flexion      Knee extension      Ankle dorsiflexion 5   5  Ankle plantarflexion      Ankle inversion  4 3+   Ankle eversion  5  5   (Blank rows = not tested) Comments:  MMT painless, scored within available ROM     FUNCTIONAL TESTS:  5xSTS: 10.33sec no UE support         GAIT: Distance walked: within clinic Assistive device utilized: cane  Level of assistance: mod I  Comments: reduced WB through LLE with and without SPC. Increased lateral trunk lean, partial step through pattern   09/12/22 FGA:  -Item 1 Gait Level Surface: mild impairment 2  -Item 2 Change in Gait Speed: moderate impairment 1  -Item 3 Gait with Horizontal Head Turns: moderate impairment 1  -Item 4 Gait with Vertical Head Turns: moderate impairment 1 -Item 5 Gait with Pivot Turn: mild impairment 2  -Item 6 Step Over Obstacle: Normal 3 -Item 7 Gait with Narrow Base of Support: moderate impairment 1  -Item 8 Gait with Eyes Closed: Normal 3            -Item 9 Ambulating Backwards: mild impairment 2  -Item 10 Steps: moderate impairment 1   Total: 17/30  * Score of <=22/30 indicates that patient is at increased risk for falls.      TODAY'S TREATMENT:  [facility] Adult PT Treatment:                                                DATE: 10/12/22 Therapeutic Exercise: Recumbent bike warm up  Standing heel raise 2x15 (knee straight) Standing heel raise 2x8 (knee slightly bent) with counter support, cues for appropriate form/pacing  Seated heel raise x15  Seated triple extension into physioball x12 cues for setup and foot positioning HEP  update + education  Neuromuscular re-ed: SLS 3x30sec LLE (longest bout without UE support 24 sec) close CGA Lunge on/off  bosu 2x10 w/ weaning UE support cues for pacing, form, and ankle stability  4inch step up + airex 2x8 cues for reduced velocity and improved postural control Slow karaokes along counter, 3 laps, no UE support, cues for posture, sequencing    [facility] Adult PT Treatment:                                                DATE: 10/05/22 Therapeutic Exercise: Recumbent bike warm up  Standing heel raise 2x10 Seated heel raise 2x8 cues for form and appropriate joint angles  Seated inversion iso with ball 2x12 cues for pacing and reduced compensations at hip  Seated red band eversion 2x12 cues for setup and reduced knee compensations Slantboard gastroc stretch x8 gentle rocking LLE cues for comfortable ROM Slantboard soleus stretch x8 gentle rocking LLE only HEP update/education  Neuromuscular re-ed: SLS LLE to fatigue (14sec) CGA for single limb stability 6 inch step up LLE leading x10 for dynamic single limb stability, cues for pacing, mechanics, and control, CGA Runners step up, LLE stance 6 inch step; 2x5 cues for single limb stability, posture, and CGA for safety Slow karaokes along counter 4 laps total each direction cues for sequencing, stability, and pacing    [facility] Adult PT Treatment:                                                DATE: 09/30/22 Therapeutic Exercise: Recumbent bike warm up 5 min Slantboard gastroc stretch 3x30sec Slantboard soleus stretch 3x30sec Standing heel raises 2x8  HEP update + education   Neuromuscular re-ed: Obstacle course for dynamic postural stability(2 hurdles, airex stepover, pool noodle stepovers, 4 cone weaving) 4 laps CGA (untimed, 24 sec, 21 sec, 20sec) Hurdle stepovers 6 hurdles, 3 laps cues for postural stability, pacing, and ankle stability Tandem walk fwd + retro walk (regular BOS) 57ft walkway, CGA Static stance on airex  + 1kg ball reaches cross body x8    [facility] Adult PT Treatment:                                                DATE: 09/12/22 Therapeutic Exercise: Reinforcing HEP from recent POC  PATIENT EDUCATION:  Education details: rationale for interventions, HEP and safe setup  Person educated: Patient Education method: Explanation, Demonstration, Tactile cues, Verbal cues, and Handouts Education comprehension: verbalized understanding, returned demonstration, verbal cues required, tactile cues required, and needs further education     HOME EXERCISE PROGRAM: Access Code: [email or web address] URL: [email or web address] Date: 10/12/2022 Prepared by: Fransisco Hertz  Exercises - Standing Gastroc Stretch  - 1 x daily - 7 x weekly - 1 sets - 10 reps - 5 hold - Standing Soleus Stretch  - 1 x daily - 7 x weekly - 1 sets - 10 reps - 5 hold - Mini Squat with Counter Support  - 1 x daily - 7 x weekly - 1-2 sets - 10 reps - Tandem Walking with Counter Support  - 1 x daily - 7 x weekly - 1-2 sets - 10 reps - Seated Heel Raise  - 1  x daily - 7 x weekly - 1-2 sets - 10 reps - Carioca with Counter Support  - 1 x daily - 7 x weekly - 1-2 sets - 3 reps - Standing Single Leg Stance with Counter Support  - 2-3 x daily - 7 x weekly - 1 sets - 2-3 reps - 30sec hold   ASSESSMENT:   CLINICAL IMPRESSION: 10/12/2022 Pt arrives w/ report of no pain today, mild soreness after last session. Continuing to progress complex postural stability work which pt tolerates well, CGA PRN for safety. Continues to demo most difficulty with exercises in knee flexed position. No adverse events, pt reports reduced stiffness and no pain on departure. Recommend continuing along current POC in order to address relevant deficits and improve functional tolerance. Pt departs today's session in no acute distress, all voiced questions/concerns addressed appropriately from PT perspective.    Per eval - Pt arrives w/o pain, endorses continued  improvement since recent discharge, would really like to work more on higher level balance. Reports exercises have been going well. States she was encouraged by surgeon to continue with PT to work on higher level mobility/balance. Pt continues to demonstrate gait impairments, higher level postural stability deficits, and limitations in ankle mobility/strength as outlined above. Functional Gait Assessment scored at 17/30 today which is indicative of fall risk (cutoff score </= 22/30). Recommend skilled PT to address these impairments with aim of improving safety/tolerance to mobility. No adverse events, reinforced continuation of HEP from recent POC. Pt departs today's session in no acute distress, all voiced questions/concerns addressed appropriately from PT perspective.      OBJECTIVE IMPAIRMENTS: Abnormal gait, decreased activity tolerance, decreased balance, decreased endurance, decreased mobility, difficulty walking, decreased ROM, decreased strength   ACTIVITY LIMITATIONS: carrying, lifting, bending, standing, squatting, stairs, transfers, and locomotion level   PARTICIPATION LIMITATIONS: meal prep, cleaning, laundry, shopping, and community activity   PERSONAL FACTORS: Time since onset of injury/illness/exacerbation are also affecting patient's functional outcome.    REHAB POTENTIAL: Good   CLINICAL DECISION MAKING: Stable/uncomplicated   EVALUATION COMPLEXITY: Low     GOALS: Goals reviewed with patient? No   SHORT TERM GOALS: Target date: 10/10/2022 Pt will demonstrate appropriate understanding and performance of initially prescribed HEP in order to facilitate improved independence with management of symptoms.  Baseline: HEP reviewed from recent POC  10/05/22: pt reports good HEP adherence Goal status: MET    2. Pt will score greater than or equal to 72 on FOTO in order to demonstrate improved perception of function due to symptoms.            Baseline: 64   10/05/22: follow up FOTO  deferred as today is only visit 3  Goal status: ONGOING  LONG TERM GOALS: Target date: 11/07/2022 Pt will score 79 or greater on FOTO in order to demonstrate improved perception of function due to symptoms.  Baseline: 64  Goal status: INITIAL    2.  Pt will demonstrate greater than 0 degrees of ankle DF AROM in order to facilitate improved tolerance to functional movements such as walking/stairs.  Baseline: see ROM chart above  Goal status: INITIAL    3.  Pt will score greater than or equal to 22/30 on Functional Gait assessment in order to indicate reduced fall risk (cutoff score </= 22/30 predictive of falls per Alvino Chapel et al 2010, [facility] 4 pts Beninato et al 2014)  Baseline: 17/30  Goal status: INITIAL    4.  Pt will perform 5xSTS in </=  8 sec without UE support in order to demonstrate improved functional mobility ([facility] 2.3sec) Baseline: 10 sec without UE support  Goal status: INITIAL    5. Pt will demonstrate appropriate performance of final prescribed HEP in order to facilitate improved self-management of symptoms post-discharge.             Baseline: HEP reviewed from recent POC   Goal status: INITIAL    6. Pt will be able to safely navigate 4 stairs without UE support and with reciprocal gait pattern in order to facilitate improved home access.  Baseline: requiring rail, step to pattern per pt report  Goal status: INITIAL       PLAN:    PT FREQUENCY: 1x/week   PT DURATION: 8 weeks   PLANNED INTERVENTIONS: Therapeutic exercises, Therapeutic activity, Neuromuscular re-education, Balance training, Gait training, Patient/Family education, Self Care, Joint mobilization, Stair training, Aquatic Therapy, Dry Needling, Electrical stimulation, Cryotherapy, Moist heat, Taping, Vasopneumatic device, Manual therapy, and Re-evaluation   PLAN FOR NEXT SESSION: review/update HEP PRN. Work on higher level balance, functional mobility. Would likely benefit from dual tasking, obstacle  navigation. Specific ankle mobility as able/tolerated      Ashley Murrain PT, DPT 10/12/2022 9:38 AM

## 2022-10-12 ENCOUNTER — Ambulatory Visit: Payer: Self-pay | Admitting: Physical Therapy

## 2022-10-12 ENCOUNTER — Encounter: Payer: Self-pay | Admitting: Physical Therapy

## 2022-10-12 DIAGNOSIS — R2689 Other abnormalities of gait and mobility: Secondary | ICD-10-CM

## 2022-10-12 DIAGNOSIS — M6281 Muscle weakness (generalized): Secondary | ICD-10-CM

## 2022-10-12 DIAGNOSIS — M25672 Stiffness of left ankle, not elsewhere classified: Secondary | ICD-10-CM

## 2022-10-18 NOTE — Therapy (Signed)
OUTPATIENT PHYSICAL THERAPY TREATMENT   Patient Name: Joanna Whitney MRN: [id number] DOB:1980-05-05, 42 y.o., female Today's Date: 10/19/2022    PCP: No PCP in chart   REFERRING PROVIDER: Teryl Lucy, MD   END OF SESSION:   PT End of Session - 10/19/22 0933     Visit Number 5    Number of Visits 13    Date for PT Re-Evaluation 11/07/22    Authorization Type self pay    PT Start Time 0934   late check in   PT Stop Time 1013    PT Time Calculation (min) 39 min    Activity Tolerance Patient tolerated treatment well;No increased pain    Behavior During Therapy WFL for tasks assessed/performed              Past Medical History:  Diagnosis Date   Ankle fracture    Left   GERD (gastroesophageal reflux disease)    Gestational diabetes    Hx of toxoplasmosis    Medical history non-contributory    Past Surgical History:  Procedure Laterality Date   NO PAST SURGERIES     ORIF ANKLE FRACTURE Left 03/03/2022   Procedure: OPEN REDUCTION INTERNAL FIXATION (ORIF) ANKLE FRACTURE;  Surgeon: Teryl Lucy, MD;  Location: [facility] OR;  Service: Orthopedics;  Laterality: Left;   SYNDESMOSIS REPAIR Left 03/03/2022   Procedure: SYNDESMOSIS REPAIR;  Surgeon: Teryl Lucy, MD;  Location: [facility] OR;  Service: Orthopedics;  Laterality: Left;   Patient Active Problem List   Diagnosis Date Noted   History of gestational diabetes 06/13/2017   Obesity (BMI 30-39.9) 03/09/2017   Language barrier 03/09/2017    REFERRING DIAG: S/P LEFT ANKLE ORIF DOS 03/03/22      THERAPY DIAG:  Other abnormalities of gait and mobility  Muscle weakness (generalized)  Stiffness of left ankle, not elsewhere classified  Rationale for Evaluation and Treatment Rehabilitation  ONSET DATE: 03/03/22 L ankle ORIF    SUBJECTIVE:   Per eval - Pt arrives after recent PT discharge on 08/18/22, was seen for ankle pain and mobility deficits s/p L ankle ORIF on 03/03/22. Pt followed up with her surgeon post discharge,  states she was encouraged to continue to participate with PT, does not think she will need any hardware removal. Sees them again in 6 weeks. States she continues to feel better with time and exercises. Reports very minimal swelling. Still using SPC most of the time but does occasionally endorse forgetting to use it in the home.   SUBJECTIVE STATEMENT: 10/19/2022 pt states she felt good after last session, still having intermittent pains in medial ankle at times, not worsening. Feels better when she is moving, HEP going well. 3/10 pain this morning.   Appreciate assistance from her son for interpreting.   Next MD visit: PRN per pt report     PERTINENT HISTORY: Unremarkable per chart review, pt states she occasionally has random palpitations but received cardiac work up which was reportedly unremarkable PAIN:  Are you having pain: 3/10  Per eval -  Best worst over past week: no pain over past week per pt    - aggravating factors: higher level balance, prolonged WB - Easing factors: rest, elevation   PRECAUTIONS: fall risk   WEIGHT BEARING RESTRICTIONS: None   FALLS:  Has patient fallen in last 6 months? Yes. Number of falls 1 fall, precipitating episode    LIVING ENVIRONMENT: 1 story 4 STE no rail Has axillary crutches and RW Lives w/ kids (4 years, 7  years, 107 years, 46 years old) and their father    OCCUPATION: Magazine features editor    PLOF: Independent   PATIENT GOALS: work on Development worker, international aid     NEXT MD VISIT: 6 weeks from eval    OBJECTIVE: (objective measures completed at initial evaluation unless otherwise dated)   DIAGNOSTIC FINDINGS:  S/p ORIF L ankle 03/03/22; WBAT   PATIENT SURVEYS:  FOTO 64 current, 79 predicted    COGNITION: Overall cognitive status: Within functional limits for tasks assessed                         SENSATION: Denies sensory complaints    PALPATION: Tenderness distal gastroc/soleus complex   LOWER EXTREMITY ROM:   Active ROM Right eval  Left eval  Hip flexion      Hip extension      Hip abduction      Hip adduction      Hip internal rotation      Hip external rotation      Knee flexion      Knee extension      Ankle dorsiflexion WFL A: -3  P: neutral   Ankle plantarflexion WFL    Ankle inversion   12  Ankle eversion   0    (Blank rows = not tested) Comments: ROM painless    LOWER EXTREMITY MMT:   MMT Right eval Left eval  Knee flexion      Knee extension      Ankle dorsiflexion 5   5  Ankle plantarflexion      Ankle inversion  4 3+   Ankle eversion  5  5   (Blank rows = not tested) Comments:  MMT painless, scored within available ROM     FUNCTIONAL TESTS:  5xSTS: 10.33sec no UE support         GAIT: Distance walked: within clinic Assistive device utilized: cane  Level of assistance: mod I  Comments: reduced WB through LLE with and without SPC. Increased lateral trunk lean, partial step through pattern   09/12/22 FGA:  -Item 1 Gait Level Surface: mild impairment 2  -Item 2 Change in Gait Speed: moderate impairment 1  -Item 3 Gait with Horizontal Head Turns: moderate impairment 1  -Item 4 Gait with Vertical Head Turns: moderate impairment 1 -Item 5 Gait with Pivot Turn: mild impairment 2  -Item 6 Step Over Obstacle: Normal 3 -Item 7 Gait with Narrow Base of Support: moderate impairment 1  -Item 8 Gait with Eyes Closed: Normal 3            -Item 9 Ambulating Backwards: mild impairment 2  -Item 10 Steps: moderate impairment 1   Total: 17/30  * Score of <=22/30 indicates that patient is at increased risk for falls.      TODAY'S TREATMENT:  [facility] Adult PT Treatment:                                                DATE: 10/19/22 Therapeutic Exercise: Recumbent bike 5 min warm up during subjective Seated heel raises x10 Post tib seated heel raises x10 (ball between heels) Post tib seated heel raise (band under 1st met head) 3x5 cues for alignment Lunge 2x8 LLE fwd, weaning UE support cues for  form HEP update (incorporation of post tib heel raises) Education on relevant anatomy/physiology  and rationale for interventions  Neuromuscular re-ed: Lunge on/off bosu x12 no UE support for postural stability  Lateral lunge onto bosu x12 LLE only, CGA weaning to SBA Fwd step up with dynadisc 2x8 weaning UE support, CGA    [facility] Adult PT Treatment:                                                DATE: 10/12/22 Therapeutic Exercise: Recumbent bike warm up  Standing heel raise 2x15 (knee straight) Standing heel raise 2x8 (knee slightly bent) with counter support, cues for appropriate form/pacing  Seated heel raise x15  Seated triple extension into physioball x12 cues for setup and foot positioning HEP update + education  Neuromuscular re-ed: SLS 3x30sec LLE (longest bout without UE support 24 sec) close CGA Lunge on/off bosu 2x10 w/ weaning UE support cues for pacing, form, and ankle stability  4inch step up + airex 2x8 cues for reduced velocity and improved postural control Slow karaokes along counter, 3 laps, no UE support, cues for posture, sequencing    [facility] Adult PT Treatment:                                                DATE: 10/05/22 Therapeutic Exercise: Recumbent bike warm up  Standing heel raise 2x10 Seated heel raise 2x8 cues for form and appropriate joint angles  Seated inversion iso with ball 2x12 cues for pacing and reduced compensations at hip  Seated red band eversion 2x12 cues for setup and reduced knee compensations Slantboard gastroc stretch x8 gentle rocking LLE cues for comfortable ROM Slantboard soleus stretch x8 gentle rocking LLE only HEP update/education  Neuromuscular re-ed: SLS LLE to fatigue (14sec) CGA for single limb stability 6 inch step up LLE leading x10 for dynamic single limb stability, cues for pacing, mechanics, and control, CGA Runners step up, LLE stance 6 inch step; 2x5 cues for single limb stability, posture, and CGA for  safety Slow karaokes along counter 4 laps total each direction cues for sequencing, stability, and pacing    [facility] Adult PT Treatment:                                                DATE: 09/30/22 Therapeutic Exercise: Recumbent bike warm up 5 min Slantboard gastroc stretch 3x30sec Slantboard soleus stretch 3x30sec Standing heel raises 2x8  HEP update + education   Neuromuscular re-ed: Obstacle course for dynamic postural stability(2 hurdles, airex stepover, pool noodle stepovers, 4 cone weaving) 4 laps CGA (untimed, 24 sec, 21 sec, 20sec) Hurdle stepovers 6 hurdles, 3 laps cues for postural stability, pacing, and ankle stability Tandem walk fwd + retro walk (regular BOS) 66ft walkway, CGA Static stance on airex + 1kg ball reaches cross body x8    [facility] Adult PT Treatment:                                                DATE: 09/12/22 Therapeutic  Exercise: Reinforcing HEP from recent POC  PATIENT EDUCATION:  Education details: rationale for interventions, HEP and safe setup  Person educated: Patient Education method: Explanation, Demonstration, Tactile cues, Verbal cues, and Handouts Education comprehension: verbalized understanding, returned demonstration, verbal cues required, tactile cues required, and needs further education     HOME EXERCISE PROGRAM: Access Code: [email or web address] URL: [email or web address] Date: 10/12/2022 (10/19/22 modified seated heel raise to include post tib bias) Prepared by: Fransisco Hertz  Exercises - Standing Gastroc Stretch  - 1 x daily - 7 x weekly - 1 sets - 10 reps - 5 hold - Standing Soleus Stretch  - 1 x daily - 7 x weekly - 1 sets - 10 reps - 5 hold - Mini Squat with Counter Support  - 1 x daily - 7 x weekly - 1-2 sets - 10 reps - Tandem Walking with Counter Support  - 1 x daily - 7 x weekly - 1-2 sets - 10 reps - Seated Heel Raise  - 1 x daily - 7 x weekly - 1-2 sets - 10 reps - Carioca with Counter Support  - 1 x daily - 7 x weekly - 1-2  sets - 3 reps - Standing Single Leg Stance with Counter Support  - 2-3 x daily - 7 x weekly - 1 sets - 2-3 reps - 30sec hold   ASSESSMENT:   CLINICAL IMPRESSION: 10/19/2022 Pt arrives w/ 3/10 pain - continues to describe as intermittent, improves with movement/activity. Continuing to progress dynamic postural stability and ankle strength with focus on knee flexed positioning, pt tolerates well. Most balance challenge noted with dynadisc although with all activities, quality of movement improves with repetition. No adverse events, pt reports no pain and significantly improved stiffness on departure. HEP update as above. Recommend continuing along current POC in order to address relevant deficits and improve functional tolerance. Pt departs today's session in no acute distress, all voiced questions/concerns addressed appropriately from PT perspective.    Per eval - Pt arrives w/o pain, endorses continued improvement since recent discharge, would really like to work more on higher level balance. Reports exercises have been going well. States she was encouraged by surgeon to continue with PT to work on higher level mobility/balance. Pt continues to demonstrate gait impairments, higher level postural stability deficits, and limitations in ankle mobility/strength as outlined above. Functional Gait Assessment scored at 17/30 today which is indicative of fall risk (cutoff score </= 22/30). Recommend skilled PT to address these impairments with aim of improving safety/tolerance to mobility. No adverse events, reinforced continuation of HEP from recent POC. Pt departs today's session in no acute distress, all voiced questions/concerns addressed appropriately from PT perspective.      OBJECTIVE IMPAIRMENTS: Abnormal gait, decreased activity tolerance, decreased balance, decreased endurance, decreased mobility, difficulty walking, decreased ROM, decreased strength   ACTIVITY LIMITATIONS: carrying, lifting, bending,  standing, squatting, stairs, transfers, and locomotion level   PARTICIPATION LIMITATIONS: meal prep, cleaning, laundry, shopping, and community activity   PERSONAL FACTORS: Time since onset of injury/illness/exacerbation are also affecting patient's functional outcome.    REHAB POTENTIAL: Good   CLINICAL DECISION MAKING: Stable/uncomplicated   EVALUATION COMPLEXITY: Low     GOALS: Goals reviewed with patient? No   SHORT TERM GOALS: Target date: 10/10/2022 Pt will demonstrate appropriate understanding and performance of initially prescribed HEP in order to facilitate improved independence with management of symptoms.  Baseline: HEP reviewed from recent POC  10/05/22: pt reports good HEP adherence Goal status: MET  2. Pt will score greater than or equal to 72 on FOTO in order to demonstrate improved perception of function due to symptoms.            Baseline: 64   10/05/22: follow up FOTO deferred as today is only visit 3  Goal status: ONGOING  LONG TERM GOALS: Target date: 11/07/2022 Pt will score 79 or greater on FOTO in order to demonstrate improved perception of function due to symptoms.  Baseline: 64  Goal status: INITIAL    2.  Pt will demonstrate greater than 0 degrees of ankle DF AROM in order to facilitate improved tolerance to functional movements such as walking/stairs.  Baseline: see ROM chart above  Goal status: INITIAL    3.  Pt will score greater than or equal to 22/30 on Functional Gait assessment in order to indicate reduced fall risk (cutoff score </= 22/30 predictive of falls per Alvino Chapel et al 2010, [facility] 4 pts Beninato et al 2014)  Baseline: 17/30  Goal status: INITIAL    4.  Pt will perform 5xSTS in </= 8 sec without UE support in order to demonstrate improved functional mobility ([facility] 2.3sec) Baseline: 10 sec without UE support  Goal status: INITIAL    5. Pt will demonstrate appropriate performance of final prescribed HEP in order to facilitate improved  self-management of symptoms post-discharge.             Baseline: HEP reviewed from recent POC   Goal status: INITIAL    6. Pt will be able to safely navigate 4 stairs without UE support and with reciprocal gait pattern in order to facilitate improved home access.  Baseline: requiring rail, step to pattern per pt report  Goal status: INITIAL       PLAN:    PT FREQUENCY: 1x/week   PT DURATION: 8 weeks   PLANNED INTERVENTIONS: Therapeutic exercises, Therapeutic activity, Neuromuscular re-education, Balance training, Gait training, Patient/Family education, Self Care, Joint mobilization, Stair training, Aquatic Therapy, Dry Needling, Electrical stimulation, Cryotherapy, Moist heat, Taping, Vasopneumatic device, Manual therapy, and Re-evaluation   PLAN FOR NEXT SESSION: review/update HEP PRN. Work on higher level balance, functional mobility. Would likely benefit from dual tasking, obstacle navigation. Specific ankle mobility as able/tolerated. Look at [facility] and balance assessment   Ashley Murrain PT, DPT 10/19/2022 10:17 AM

## 2022-10-19 ENCOUNTER — Ambulatory Visit: Payer: Self-pay | Admitting: Physical Therapy

## 2022-10-19 ENCOUNTER — Encounter: Payer: Self-pay | Admitting: Physical Therapy

## 2022-10-19 DIAGNOSIS — M25672 Stiffness of left ankle, not elsewhere classified: Secondary | ICD-10-CM

## 2022-10-19 DIAGNOSIS — R2689 Other abnormalities of gait and mobility: Secondary | ICD-10-CM

## 2022-10-19 DIAGNOSIS — M6281 Muscle weakness (generalized): Secondary | ICD-10-CM

## 2022-10-25 NOTE — Therapy (Signed)
OUTPATIENT PHYSICAL THERAPY TREATMENT   Patient Name: Joanna Whitney MRN: [id number] DOB:[birth year], 42 y.o., female Today's Date: 10/25/2022    PCP: No PCP in chart   REFERRING PROVIDER: Teryl Lucy, MD   END OF SESSION:      Past Medical History:  Diagnosis Date   Ankle fracture    Left   GERD (gastroesophageal reflux disease)    Gestational diabetes    Hx of toxoplasmosis    Medical history non-contributory    Past Surgical History:  Procedure Laterality Date   NO PAST SURGERIES     ORIF ANKLE FRACTURE Left 03/03/2022   Procedure: OPEN REDUCTION INTERNAL FIXATION (ORIF) ANKLE FRACTURE;  Surgeon: Teryl Lucy, MD;  Location: [facility] OR;  Service: Orthopedics;  Laterality: Left;   SYNDESMOSIS REPAIR Left 03/03/2022   Procedure: SYNDESMOSIS REPAIR;  Surgeon: Teryl Lucy, MD;  Location: [facility] OR;  Service: Orthopedics;  Laterality: Left;   Patient Active Problem List   Diagnosis Date Noted   History of gestational diabetes 06/13/2017   Obesity (BMI 30-39.9) 03/09/2017   Language barrier 03/09/2017    REFERRING DIAG: S/P LEFT ANKLE ORIF DOS 03/03/22      THERAPY DIAG:  No diagnosis found.  Rationale for Evaluation and Treatment Rehabilitation  ONSET DATE: 03/03/22 L ankle ORIF    SUBJECTIVE:   Per eval - Pt arrives after recent PT discharge on 08/18/22, was seen for ankle pain and mobility deficits s/p L ankle ORIF on 03/03/22. Pt followed up with her surgeon post discharge, states she was encouraged to continue to participate with PT, does not think she will need any hardware removal. Sees them again in 6 weeks. States she continues to feel better with time and exercises. Reports very minimal swelling. Still using SPC most of the time but does occasionally endorse forgetting to use it in the home.   SUBJECTIVE STATEMENT: 10/25/2022 ***  *** pt states she felt good after last session, still having intermittent pains in medial ankle at times, not worsening. Feels  better when she is moving, HEP going well. 3/10 pain this morning.   Appreciate assistance from her son for interpreting.   Next MD visit: PRN per pt report     PERTINENT HISTORY: Unremarkable per chart review, pt states she occasionally has random palpitations but received cardiac work up which was reportedly unremarkable PAIN:  Are you having pain: 3/10 ***   Per eval -  Best worst over past week: no pain over past week per pt    - aggravating factors: higher level balance, prolonged WB - Easing factors: rest, elevation   PRECAUTIONS: fall risk   WEIGHT BEARING RESTRICTIONS: None   FALLS:  Has patient fallen in last 6 months? Yes. Number of falls 1 fall, precipitating episode    LIVING ENVIRONMENT: 1 story 4 STE no rail Has axillary crutches and RW Lives w/ kids (4 years, 7 years, 48 years, 65 years old) and their father    OCCUPATION: Magazine features editor    PLOF: Independent   PATIENT GOALS: work on Development worker, international aid     NEXT MD VISIT: 6 weeks from eval    OBJECTIVE: (objective measures completed at initial evaluation unless otherwise dated)   DIAGNOSTIC FINDINGS:  S/p ORIF L ankle 03/03/22; WBAT   PATIENT SURVEYS:  FOTO 64 current, 79 predicted  10/26/22 FOTO ***    COGNITION: Overall cognitive status: Within functional limits for tasks assessed  SENSATION: Denies sensory complaints    PALPATION: Tenderness distal gastroc/soleus complex   LOWER EXTREMITY ROM:   Active ROM Right eval Left eval Left 10/26/22  Hip flexion       Hip extension       Hip abduction       Hip adduction       Hip internal rotation       Hip external rotation       Knee flexion       Knee extension       Ankle dorsiflexion WFL A: -3  P: neutral  ***   Ankle plantarflexion WFL     Ankle inversion   12   Ankle eversion   0     (Blank rows = not tested) Comments: ROM painless    LOWER EXTREMITY MMT:   MMT Right eval Left eval Left 10/26/22  Knee  flexion       Knee extension       Ankle dorsiflexion 5   5   Ankle plantarflexion       Ankle inversion  4 3+  ***   Ankle eversion  5  5    (Blank rows = not tested) Comments:  MMT painless, scored within available ROM     FUNCTIONAL TESTS:  5xSTS: 10.33sec no UE support  10/26/22: - 5xSTS *** - stair assessment: ***          GAIT: Distance walked: within clinic Assistive device utilized: cane  Level of assistance: mod I  Comments: reduced WB through LLE with and without SPC. Increased lateral trunk lean, partial step through pattern   09/12/22 FGA:  -Item 1 Gait Level Surface: mild impairment 2  -Item 2 Change in Gait Speed: moderate impairment 1  -Item 3 Gait with Horizontal Head Turns: moderate impairment 1  -Item 4 Gait with Vertical Head Turns: moderate impairment 1 -Item 5 Gait with Pivot Turn: mild impairment 2  -Item 6 Step Over Obstacle: Normal 3 -Item 7 Gait with Narrow Base of Support: moderate impairment 1  -Item 8 Gait with Eyes Closed: Normal 3            -Item 9 Ambulating Backwards: mild impairment 2  -Item 10 Steps: moderate impairment 1   Total: 17/30  * Score of <=22/30 indicates that patient is at increased risk for falls.     10/26/22 FGA:  -Item 1 Gait Level Surface: Normal 3 / mild impairment 2 / moderate impairment 1  / severe impairment 0 -Item 2 Change in Gait Speed: Normal 3 / mild impairment 2 / moderate impairment 1  / severe impairment 0 -Item 3 Gait with Horizontal Head Turns: Normal 3 / mild impairment 2 / moderate impairment 1  / severe impairment 0 -Item 4 Gait with Vertical Head Turns: Normal 3 / mild impairment 2 / moderate impairment 1  / severe impairment 0 -Item 5 Gait with Pivot Turn: Normal 3 / mild impairment 2 / moderate impairment 1  / severe impairment 0 -Item 6 Step Over Obstacle: Normal 3 / mild impairment 2 / moderate impairment 1  / severe impairment 0 -Item 7 Gait with Narrow Base of Support: Normal 3 / mild impairment  2 / moderate impairment 1  / severe impairment 0 -Item 8 Gait with Eyes Closed: Normal 3 / mild impairment 2 / moderate impairment 1  / severe impairment 0            -Item 9 Ambulating Backwards: Normal 3 /  mild impairment 2 / moderate impairment 1  / severe impairment 0 -Item 10 Steps: Normal 3 / mild impairment 2 / moderate impairment 1  / severe impairment 0 Total: *** /30  * Score of <=22/30 indicates that patient is at increased risk for falls.      TODAY'S TREATMENT:  [facility] Adult PT Treatment:                                                DATE: 10/26/22 Therapeutic Exercise: *** Manual Therapy: *** Neuromuscular re-ed: *** Therapeutic Activity: *** Modalities: *** Self Care: ***    [facility] Adult PT Treatment:                                                DATE: 10/19/22 Therapeutic Exercise: Recumbent bike 5 min warm up during subjective Seated heel raises x10 Post tib seated heel raises x10 (ball between heels) Post tib seated heel raise (band under 1st met head) 3x5 cues for alignment Lunge 2x8 LLE fwd, weaning UE support cues for form HEP update (incorporation of post tib heel raises) Education on relevant anatomy/physiology and rationale for interventions  Neuromuscular re-ed: Lunge on/off bosu x12 no UE support for postural stability  Lateral lunge onto bosu x12 LLE only, CGA weaning to SBA Fwd step up with dynadisc 2x8 weaning UE support, CGA    [facility] Adult PT Treatment:                                                DATE: 10/12/22 Therapeutic Exercise: Recumbent bike warm up  Standing heel raise 2x15 (knee straight) Standing heel raise 2x8 (knee slightly bent) with counter support, cues for appropriate form/pacing  Seated heel raise x15  Seated triple extension into physioball x12 cues for setup and foot positioning HEP update + education  Neuromuscular re-ed: SLS 3x30sec LLE (longest bout without UE support 24 sec) close CGA Lunge on/off bosu 2x10 w/  weaning UE support cues for pacing, form, and ankle stability  4inch step up + airex 2x8 cues for reduced velocity and improved postural control Slow karaokes along counter, 3 laps, no UE support, cues for posture, sequencing    [facility] Adult PT Treatment:                                                DATE: 10/05/22 Therapeutic Exercise: Recumbent bike warm up  Standing heel raise 2x10 Seated heel raise 2x8 cues for form and appropriate joint angles  Seated inversion iso with ball 2x12 cues for pacing and reduced compensations at hip  Seated red band eversion 2x12 cues for setup and reduced knee compensations Slantboard gastroc stretch x8 gentle rocking LLE cues for comfortable ROM Slantboard soleus stretch x8 gentle rocking LLE only HEP update/education  Neuromuscular re-ed: SLS LLE to fatigue (14sec) CGA for single limb stability 6 inch step up LLE leading x10 for dynamic single limb stability, cues for pacing,  mechanics, and control, CGA Runners step up, LLE stance 6 inch step; 2x5 cues for single limb stability, posture, and CGA for safety Slow karaokes along counter 4 laps total each direction cues for sequencing, stability, and pacing    [facility] Adult PT Treatment:                                                DATE: 09/30/22 Therapeutic Exercise: Recumbent bike warm up 5 min Slantboard gastroc stretch 3x30sec Slantboard soleus stretch 3x30sec Standing heel raises 2x8  HEP update + education   Neuromuscular re-ed: Obstacle course for dynamic postural stability(2 hurdles, airex stepover, pool noodle stepovers, 4 cone weaving) 4 laps CGA (untimed, 24 sec, 21 sec, 20sec) Hurdle stepovers 6 hurdles, 3 laps cues for postural stability, pacing, and ankle stability Tandem walk fwd + retro walk (regular BOS) 30ft walkway, CGA Static stance on airex + 1kg ball reaches cross body x8    [facility] Adult PT Treatment:                                                DATE: 09/12/22 Therapeutic  Exercise: Reinforcing HEP from recent POC  PATIENT EDUCATION:  Education details: rationale for interventions, HEP and safe setup  Person educated: Patient Education method: Explanation, Demonstration, Tactile cues, Verbal cues, and Handouts Education comprehension: verbalized understanding, returned demonstration, verbal cues required, tactile cues required, and needs further education     HOME EXERCISE PROGRAM: Access Code: [email or web address] URL: [email or web address] Date: 10/12/2022 (10/19/22 modified seated heel raise to include post tib bias) Prepared by: Fransisco Hertz  Exercises - Standing Gastroc Stretch  - 1 x daily - 7 x weekly - 1 sets - 10 reps - 5 hold - Standing Soleus Stretch  - 1 x daily - 7 x weekly - 1 sets - 10 reps - 5 hold - Mini Squat with Counter Support  - 1 x daily - 7 x weekly - 1-2 sets - 10 reps - Tandem Walking with Counter Support  - 1 x daily - 7 x weekly - 1-2 sets - 10 reps - Seated Heel Raise  - 1 x daily - 7 x weekly - 1-2 sets - 10 reps - Carioca with Counter Support  - 1 x daily - 7 x weekly - 1-2 sets - 3 reps - Standing Single Leg Stance with Counter Support  - 2-3 x daily - 7 x weekly - 1 sets - 2-3 reps - 30sec hold   ASSESSMENT:   CLINICAL IMPRESSION: 10/25/2022 ***  *** Pt arrives w/ 3/10 pain - continues to describe as intermittent, improves with movement/activity. Continuing to progress dynamic postural stability and ankle strength with focus on knee flexed positioning, pt tolerates well. Most balance challenge noted with dynadisc although with all activities, quality of movement improves with repetition. No adverse events, pt reports no pain and significantly improved stiffness on departure. HEP update as above. Recommend continuing along current POC in order to address relevant deficits and improve functional tolerance. Pt departs today's session in no acute distress, all voiced questions/concerns addressed appropriately from PT  perspective.    Per eval - Pt arrives w/o pain, endorses continued improvement since recent discharge, would really like to work  more on higher level balance. Reports exercises have been going well. States she was encouraged by surgeon to continue with PT to work on higher level mobility/balance. Pt continues to demonstrate gait impairments, higher level postural stability deficits, and limitations in ankle mobility/strength as outlined above. Functional Gait Assessment scored at 17/30 today which is indicative of fall risk (cutoff score </= 22/30). Recommend skilled PT to address these impairments with aim of improving safety/tolerance to mobility. No adverse events, reinforced continuation of HEP from recent POC. Pt departs today's session in no acute distress, all voiced questions/concerns addressed appropriately from PT perspective.      OBJECTIVE IMPAIRMENTS: Abnormal gait, decreased activity tolerance, decreased balance, decreased endurance, decreased mobility, difficulty walking, decreased ROM, decreased strength   ACTIVITY LIMITATIONS: carrying, lifting, bending, standing, squatting, stairs, transfers, and locomotion level   PARTICIPATION LIMITATIONS: meal prep, cleaning, laundry, shopping, and community activity   PERSONAL FACTORS: Time since onset of injury/illness/exacerbation are also affecting patient's functional outcome.    REHAB POTENTIAL: Good   CLINICAL DECISION MAKING: Stable/uncomplicated   EVALUATION COMPLEXITY: Low     GOALS: Goals reviewed with patient? No   SHORT TERM GOALS: Target date: 10/10/2022 Pt will demonstrate appropriate understanding and performance of initially prescribed HEP in order to facilitate improved independence with management of symptoms.  Baseline: HEP reviewed from recent POC  10/05/22: pt reports good HEP adherence Goal status: MET    2. Pt will score greater than or equal to 72 on FOTO in order to demonstrate improved perception of function  due to symptoms.            Baseline: 64   10/05/22: follow up FOTO deferred as today is only visit 3  10/26/22: ***   Goal status: ***   LONG TERM GOALS: Target date: 11/07/2022 Pt will score 79 or greater on FOTO in order to demonstrate improved perception of function due to symptoms.  Baseline: 64  10/26/22: ***  Goal status: ***    2.  Pt will demonstrate greater than 0 degrees of ankle DF AROM in order to facilitate improved tolerance to functional movements such as walking/stairs.  Baseline: see ROM chart above  10/26/22: ***  Goal status: ***    3.  Pt will score greater than or equal to 22/30 on Functional Gait assessment in order to indicate reduced fall risk (cutoff score </= 22/30 predictive of falls per Alvino Chapel et al 2010, MCID 4 pts Beninato et al 2014)  Baseline: 17/30  10/26/22: ***   Goal status: ***    4.  Pt will perform 5xSTS in </= 8 sec without UE support in order to demonstrate improved functional mobility (MCID 2.3sec) Baseline: 10 sec without UE support  10/26/22: ***  Goal status: ***    5. Pt will demonstrate appropriate performance of final prescribed HEP in order to facilitate improved self-management of symptoms post-discharge.             Baseline: HEP reviewed from recent POC   10/26/22: ***   Goal status: ***    6. Pt will be able to safely navigate 4 stairs without UE support and with reciprocal gait pattern in order to facilitate improved home access.  Baseline: requiring rail, step to pattern per pt report  10/26/22: ***   Goal status: ***       PLAN:    PT FREQUENCY: 1x/week   PT DURATION: 8 weeks   PLANNED INTERVENTIONS: Therapeutic exercises, Therapeutic activity, Neuromuscular re-education, Balance training, Gait  training, Patient/Family education, Self Care, Joint mobilization, Stair training, Aquatic Therapy, Dry Needling, Electrical stimulation, Cryotherapy, Moist heat, Taping, Vasopneumatic device, Manual therapy, and Re-evaluation    PLAN FOR NEXT SESSION: review/update HEP PRN. Work on higher level balance, functional mobility. Would likely benefit from dual tasking, obstacle navigation. Specific ankle mobility as able/tolerated. Look at [facility] and balance assessment ***    Ashley Murrain PT, DPT 10/25/2022 12:07 PM

## 2022-10-26 ENCOUNTER — Ambulatory Visit: Payer: Self-pay | Attending: Orthopedic Surgery | Admitting: Physical Therapy

## 2022-10-26 ENCOUNTER — Encounter: Payer: Self-pay | Admitting: Physical Therapy

## 2022-10-26 DIAGNOSIS — R2689 Other abnormalities of gait and mobility: Secondary | ICD-10-CM | POA: Insufficient documentation

## 2022-10-26 DIAGNOSIS — M25672 Stiffness of left ankle, not elsewhere classified: Secondary | ICD-10-CM | POA: Insufficient documentation

## 2022-10-26 DIAGNOSIS — M6281 Muscle weakness (generalized): Secondary | ICD-10-CM | POA: Insufficient documentation

## 2022-11-02 ENCOUNTER — Ambulatory Visit: Payer: Self-pay | Admitting: Physical Therapy

## 2022-11-02 ENCOUNTER — Encounter: Payer: Self-pay | Admitting: Physical Therapy

## 2022-11-02 DIAGNOSIS — M6281 Muscle weakness (generalized): Secondary | ICD-10-CM

## 2022-11-02 DIAGNOSIS — R2689 Other abnormalities of gait and mobility: Secondary | ICD-10-CM

## 2022-11-02 NOTE — Therapy (Signed)
OUTPATIENT PHYSICAL THERAPY TREATMENT NOTE   Patient Name: Joanna Whitney MRN: [id number] DOB:1980/09/18, 42 y.o., female Today's Date: 11/02/2022         PCP: No PCP in chart   REFERRING PROVIDER: Teryl Lucy, MD   END OF SESSION:   PT End of Session - 11/02/22 0852     Visit Number 7    Number of Visits 13    Date for PT Re-Evaluation 12/07/22    Authorization Type self pay    PT Start Time 0850    PT Stop Time 0930    PT Time Calculation (min) 40 min               Past Medical History:  Diagnosis Date   Ankle fracture    Left   GERD (gastroesophageal reflux disease)    Gestational diabetes    Hx of toxoplasmosis    Medical history non-contributory    Past Surgical History:  Procedure Laterality Date   NO PAST SURGERIES     ORIF ANKLE FRACTURE Left 03/03/2022   Procedure: OPEN REDUCTION INTERNAL FIXATION (ORIF) ANKLE FRACTURE;  Surgeon: Teryl Lucy, MD;  Location: [facility] OR;  Service: Orthopedics;  Laterality: Left;   SYNDESMOSIS REPAIR Left 03/03/2022   Procedure: SYNDESMOSIS REPAIR;  Surgeon: Teryl Lucy, MD;  Location: [facility] OR;  Service: Orthopedics;  Laterality: Left;   Patient Active Problem List   Diagnosis Date Noted   History of gestational diabetes 06/13/2017   Obesity (BMI 30-39.9) 03/09/2017   Language barrier 03/09/2017    REFERRING DIAG: S/P LEFT ANKLE ORIF DOS 03/03/22      THERAPY DIAG:  Other abnormalities of gait and mobility  Muscle weakness (generalized)  Rationale for Evaluation and Treatment Rehabilitation  ONSET DATE: 03/03/22 L ankle ORIF    SUBJECTIVE:   Per eval - Pt arrives after recent PT discharge on 08/18/22, was seen for ankle pain and mobility deficits s/p L ankle ORIF on 03/03/22. Pt followed up with her surgeon post discharge, states she was encouraged to continue to participate with PT, does not think she will need any hardware removal. Sees them again in 6 weeks. States she continues to feel better with time  and exercises. Reports very minimal swelling. Still using SPC most of the time but does occasionally endorse forgetting to use it in the home.   SUBJECTIVE STATEMENT: 11/02/2022 continues to endorse mild pain/stiffness in mornings, improves with movement. No pain on arrival. No AD.  Appreciate assistance from her son for interpreting.   Next MD visit: PRN per pt report     PERTINENT HISTORY: Unremarkable per chart review, pt states she occasionally has random palpitations but received cardiac work up which was reportedly unremarkable PAIN:  Are you having pain: none (worst 2/10 in past week)  Per eval -  Best worst over past week: no pain over past week per pt    - aggravating factors: higher level balance, prolonged WB - Easing factors: rest, elevation   PRECAUTIONS: fall risk   WEIGHT BEARING RESTRICTIONS: None   FALLS:  Has patient fallen in last 6 months? Yes. Number of falls 1 fall, precipitating episode    LIVING ENVIRONMENT: 1 story 4 STE no rail Has axillary crutches and RW Lives w/ kids (4 years, 7 years, 25 years, 53 years old) and their father    OCCUPATION: Magazine features editor    PLOF: Independent   PATIENT GOALS: work on Development worker, international aid     NEXT MD VISIT: 6 weeks from eval  OBJECTIVE: (objective measures completed at initial evaluation unless otherwise dated)   DIAGNOSTIC FINDINGS:  S/p ORIF L ankle 03/03/22; WBAT   PATIENT SURVEYS:  FOTO 64 current, 79 predicted  10/26/22 FOTO 67    COGNITION: Overall cognitive status: Within functional limits for tasks assessed                         SENSATION: Denies sensory complaints    PALPATION: Tenderness distal gastroc/soleus complex   LOWER EXTREMITY ROM:   Active ROM Right eval Left eval Left 10/26/22  Hip flexion       Hip extension       Hip abduction       Hip adduction       Hip internal rotation       Hip external rotation       Knee flexion       Knee extension       Ankle dorsiflexion WFL  A: -3  P: neutral  A: neutral Passive: 3 deg s   Ankle plantarflexion WFL     Ankle inversion   12   Ankle eversion   0     (Blank rows = not tested) Comments: ROM painless    LOWER EXTREMITY MMT:   MMT Right eval Left eval Left 10/26/22  Knee flexion       Knee extension       Ankle dorsiflexion 5   5   Ankle plantarflexion       Ankle inversion  4 3+  4- painful in lateral ankle    Ankle eversion  5  5    (Blank rows = not tested) Comments:  MMT painless, scored within available ROM     FUNCTIONAL TESTS:  5xSTS: 10.33sec no UE support  10/26/22: - 5xSTS 10.38 sec no UE support, no pain, symmetrical WB - stair assessment: reciprocal ascending without UE support, step to descending without UE support - is able to descend with reciprocal pattern but significantly altered kinematics         GAIT: Distance walked: within clinic Assistive device utilized: cane  Level of assistance: mod I  Comments: reduced WB through LLE with and without SPC. Increased lateral trunk lean, partial step through pattern   09/12/22 FGA:  -Item 1 Gait Level Surface: mild impairment 2  -Item 2 Change in Gait Speed: moderate impairment 1  -Item 3 Gait with Horizontal Head Turns: moderate impairment 1  -Item 4 Gait with Vertical Head Turns: moderate impairment 1 -Item 5 Gait with Pivot Turn: mild impairment 2  -Item 6 Step Over Obstacle: Normal 3 -Item 7 Gait with Narrow Base of Support: moderate impairment 1  -Item 8 Gait with Eyes Closed: Normal 3            -Item 9 Ambulating Backwards: mild impairment 2  -Item 10 Steps: moderate impairment 1   Total: 17/30  * Score of <=22/30 indicates that patient is at increased risk for falls.     10/26/22 FGA: (performed without AD) -Item 1 Gait Level Surface:  mild impairment 2  -Item 2 Change in Gait Speed: Normal 3  -Item 3 Gait with Horizontal Head Turns: mild impairment 2  -Item 4 Gait with Vertical Head Turns: mild impairment 2  -Item 5  Gait with Pivot Turn: mild impairment 2  -Item 6 Step Over Obstacle: Normal 3  -Item 7 Gait with Narrow Base of Support: Normal 3 -Item 8 Gait with Eyes  Closed: mild impairment 2             -Item 9 Ambulating Backwards: Normal 3  -Item 10 Steps: mild impairment 2 Total: 24 /30  * Score of <=22/30 indicates that patient is at increased risk for falls.      TODAY'S TREATMENT:  [facility] Adult PT Treatment:                                                DATE: 11/02/22 Therapeutic Exercise: BOSU flat surface wide static balance, A/P rocking and lateral weight shifting without UE support BOSU flat surface narrow static balance, A/P rocking without UE support BOSU dome surface narrow static balance without UE, bilat hopping with UE Bosu step ups on dome with min UE support x 10 each  Bilat heel raises Single heel raises Exaggerated toe off in parallel bars to mimic jogging toe off mechanics advancing forward  Slant board stretch     [facility] Adult PT Treatment:                                                DATE: 10/26/22 Therapeutic Activity: MSK assessment + education FOTO + education FGA + education Stair assessment + education (4 laps on 4 stairs) Education/discussion re: progress with PT, symptom behavior as it affects activity tolerance, PT goals/POC, safety w/ mobility re: AD use (see assessment) Lunge onto 6 inch step x12 cues for appropriate setup and safety w/ home performance    [facility] Adult PT Treatment:                                                DATE: 10/19/22 Therapeutic Exercise: Recumbent bike 5 min warm up during subjective Seated heel raises x10 Post tib seated heel raises x10 (ball between heels) Post tib seated heel raise (band under 1st met head) 3x5 cues for alignment Lunge 2x8 LLE fwd, weaning UE support cues for form HEP update (incorporation of post tib heel raises) Education on relevant anatomy/physiology and rationale for interventions  Neuromuscular  re-ed: Lunge on/off bosu x12 no UE support for postural stability  Lateral lunge onto bosu x12 LLE only, CGA weaning to SBA Fwd step up with dynadisc 2x8 weaning UE support, CGA    [facility] Adult PT Treatment:                                                DATE: 10/12/22 Therapeutic Exercise: Recumbent bike warm up  Standing heel raise 2x15 (knee straight) Standing heel raise 2x8 (knee slightly bent) with counter support, cues for appropriate form/pacing  Seated heel raise x15  Seated triple extension into physioball x12 cues for setup and foot positioning HEP update + education  Neuromuscular re-ed: SLS 3x30sec LLE (longest bout without UE support 24 sec) close CGA Lunge on/off bosu 2x10 w/ weaning UE support cues for pacing, form, and ankle stability  4inch step up + airex 2x8 cues for reduced  velocity and improved postural control Slow karaokes along counter, 3 laps, no UE support, cues for posture, sequencing   PATIENT EDUCATION:  Education details: rationale for interventions, HEP, progress with PT thus far, PT POC  Person educated: Patient Education method: Explanation, Demonstration, Tactile cues, Verbal cues, and Handouts Education comprehension: verbalized understanding, returned demonstration, verbal cues required, tactile cues required, and needs further education     HOME EXERCISE PROGRAM: Access Code: [email or web address] URL: [email or web address] Date: 10/12/2022 (10/19/22 modified seated heel raise to include post tib bias) Prepared by: Fransisco Hertz  Exercises - Standing Gastroc Stretch  - 1 x daily - 7 x weekly - 1 sets - 10 reps - 5 hold - Standing Soleus Stretch  - 1 x daily - 7 x weekly - 1 sets - 10 reps - 5 hold - Mini Squat with Counter Support  - 1 x daily - 7 x weekly - 1-2 sets - 10 reps - Tandem Walking with Counter Support  - 1 x daily - 7 x weekly - 1-2 sets - 10 reps - Seated Heel Raise  - 1 x daily - 7 x weekly - 1-2 sets - 10 reps - Carioca  with Counter Support  - 1 x daily - 7 x weekly - 1-2 sets - 3 reps - Standing Single Leg Stance with Counter Support  - 2-3 x daily - 7 x weekly - 1 sets - 2-3 reps - 30sec hold   ASSESSMENT:   CLINICAL IMPRESSION: 11/02/2022 Pt reports no pain on arrival. Progressed with BOSU balance challenges which she did well with, reporting feeling her foot ankle ankle stiffness improving. Reviewed calf strengthening and discussed the role calf strength plays in return to running. Pt still limited with PF strength and ROM.     Per Progress note: Pt arrives w/o pain - continues to endorse steady improvement overall, improving symptoms with movement. Continues to endorse concern with higher level balance and desire to return to running in the future. Objectively, pt continues to make slow/steady progress with ankle ROM/MMT. No significant improvement in 5xSTS today although her FGA score has improved notably - while she is no longer in fall risk category, she does continue to demonstrate some compensatory strategies mostly with dual tasking. No LOB or gross instability. Given her improvement in balance, we discuss beginning to wean from [facility] as comfortable, particularly in familiar or non-challenging environments. She does demonstrate some compensatory strategies (slowing down) in more complex environments or with dual tasking, so continuing to recommend [facility] use in novel or complex environments for now. In discussion with pt, she states she can see progress and would like to continue with remaining visits in POC in order to maximize her progress. Extended dates of POC accordingly as below. Pt endorses improving stiffness as session goes on, no pain on departure and no adverse events. Recommend continuing along updated POC in order to address relevant deficits and improve functional tolerance. Pt departs today's session in no acute distress, all voiced questions/concerns addressed appropriately from PT perspective.     Per eval - Pt arrives w/o pain, endorses continued improvement since recent discharge, would really like to work more on higher level balance. Reports exercises have been going well. States she was encouraged by surgeon to continue with PT to work on higher level mobility/balance. Pt continues to demonstrate gait impairments, higher level postural stability deficits, and limitations in ankle mobility/strength as outlined above. Functional Gait Assessment scored at 17/30 today which is indicative of fall  risk (cutoff score </= 22/30). Recommend skilled PT to address these impairments with aim of improving safety/tolerance to mobility. No adverse events, reinforced continuation of HEP from recent POC. Pt departs today's session in no acute distress, all voiced questions/concerns addressed appropriately from PT perspective.      OBJECTIVE IMPAIRMENTS: Abnormal gait, decreased activity tolerance, decreased balance, decreased endurance, decreased mobility, difficulty walking, decreased ROM, decreased strength   ACTIVITY LIMITATIONS: carrying, lifting, bending, standing, squatting, stairs, transfers, and locomotion level   PARTICIPATION LIMITATIONS: meal prep, cleaning, laundry, shopping, and community activity   PERSONAL FACTORS: Time since onset of injury/illness/exacerbation are also affecting patient's functional outcome.    REHAB POTENTIAL: Good   CLINICAL DECISION MAKING: Stable/uncomplicated   EVALUATION COMPLEXITY: Low     GOALS: Goals reviewed with patient? No   SHORT TERM GOALS: Target date: 10/10/2022 Pt will demonstrate appropriate understanding and performance of initially prescribed HEP in order to facilitate improved independence with management of symptoms.  Baseline: HEP reviewed from recent POC  10/05/22: pt reports good HEP adherence Goal status: MET    2. Pt will score greater than or equal to 72 on FOTO in order to demonstrate improved perception of function due to  symptoms.            Baseline: 64   10/05/22: follow up FOTO deferred as today is only visit 3  10/26/22: 67   Goal status: ONGOING  LONG TERM GOALS: Target date: 12/07/2022  (Updated 10/26/22) Pt will score 79 or greater on FOTO in order to demonstrate improved perception of function due to symptoms.  Baseline: 64  10/26/22: 67  Goal status: ONGOING    2.  Pt will demonstrate greater than 0 degrees of ankle DF AROM in order to facilitate improved tolerance to functional movements such as walking/stairs.  Baseline: see ROM chart above  10/26/22: able to actively achieve neutral  Goal status: NEARLY MET    3.  Pt will score greater than or equal to 22/30 on Functional Gait assessment in order to indicate reduced fall risk (cutoff score </= 22/30 predictive of falls per Alvino Chapel et al 2010, [facility] 4 pts Beninato et al 2014)  Baseline: 17/30  10/26/22: 24/30   Goal status: MET    4.  Pt will perform 5xSTS in </= 8 sec without UE support in order to demonstrate improved functional mobility ([facility] 2.3sec) Baseline: 10 sec without UE support  10/26/22: 10 sec no UE support   Goal status: ONGOING    5. Pt will demonstrate appropriate performance of final prescribed HEP in order to facilitate improved self-management of symptoms post-discharge.             Baseline: HEP reviewed from recent POC   10/26/22: reports good HEP adherence   Goal status: ONGOING    6. Pt will be able to safely navigate 4 stairs without UE support and with reciprocal gait pattern in order to facilitate improved home access.  Baseline: requiring rail, step to pattern per pt report  10/26/22: ascending reciprocal, descending step to   Goal status: PARTIALLY MET       PLAN: updated 10/26/22   PT FREQUENCY: 1x/week   PT DURATION: 6 weeks   PLANNED INTERVENTIONS: Therapeutic exercises, Therapeutic activity, Neuromuscular re-education, Balance training, Gait training, Patient/Family education, Self Care, Joint  mobilization, Stair training, Aquatic Therapy, Dry Needling, Electrical stimulation, Cryotherapy, Moist heat, Taping, Vasopneumatic device, Manual therapy, and Re-evaluation   PLAN FOR NEXT SESSION: review/update HEP PRN. Continue working  on higher level balance - would likely benefit from more dynamic stability with dual tasking and environmental distractions. Pt states her end goal is to be able to run, even if only for short distance   Jannette Spanner, [place] 11/02/22 9:50 AM Phone: [phone] Fax: [phone]

## 2022-11-08 NOTE — Therapy (Signed)
OUTPATIENT PHYSICAL THERAPY TREATMENT NOTE   Patient Name: Joanna Whitney MRN: [id number] DOB:09/12/1980, 42 y.o., female Today's Date: 11/09/2022         PCP: No PCP in chart   REFERRING PROVIDER: Teryl Lucy, MD   END OF SESSION:   PT End of Session - 11/09/22 0932     Visit Number 8    Number of Visits 13    Date for PT Re-Evaluation 12/07/22    Authorization Type self pay    PT Start Time 0934    PT Stop Time 1019    PT Time Calculation (min) 45 min    Activity Tolerance Patient tolerated treatment well;No increased pain    Behavior During Therapy WFL for tasks assessed/performed                Past Medical History:  Diagnosis Date   Ankle fracture    Left   GERD (gastroesophageal reflux disease)    Gestational diabetes    Hx of toxoplasmosis    Medical history non-contributory    Past Surgical History:  Procedure Laterality Date   NO PAST SURGERIES     ORIF ANKLE FRACTURE Left 03/03/2022   Procedure: OPEN REDUCTION INTERNAL FIXATION (ORIF) ANKLE FRACTURE;  Surgeon: Teryl Lucy, MD;  Location: [facility] OR;  Service: Orthopedics;  Laterality: Left;   SYNDESMOSIS REPAIR Left 03/03/2022   Procedure: SYNDESMOSIS REPAIR;  Surgeon: Teryl Lucy, MD;  Location: [facility] OR;  Service: Orthopedics;  Laterality: Left;   Patient Active Problem List   Diagnosis Date Noted   History of gestational diabetes 06/13/2017   Obesity (BMI 30-39.9) 03/09/2017   Language barrier 03/09/2017    REFERRING DIAG: S/P LEFT ANKLE ORIF DOS 03/03/22      THERAPY DIAG:  Other abnormalities of gait and mobility  Muscle weakness (generalized)  Stiffness of left ankle, not elsewhere classified  Rationale for Evaluation and Treatment Rehabilitation  ONSET DATE: 03/03/22 L ankle ORIF    SUBJECTIVE:   Per eval - Pt arrives after recent PT discharge on 08/18/22, was seen for ankle pain and mobility deficits s/p L ankle ORIF on 03/03/22. Pt followed up with her surgeon post  discharge, states she was encouraged to continue to participate with PT, does not think she will need any hardware removal. Sees them again in 6 weeks. States she continues to feel better with time and exercises. Reports very minimal swelling. Still using SPC most of the time but does occasionally endorse forgetting to use it in the home.   SUBJECTIVE STATEMENT: 11/09/2022 continues to endorse feeling better overall. Arrives w/o AD, states she is only using when she is in really busy/crowded environment. No pain at present. Still having most difficulty with descending stairs, wants to work towards more high impact activities such as running and getting back to zumba.   Appreciate assistance from her son for interpreting.   Next MD visit: PRN per pt report     PERTINENT HISTORY: Unremarkable per chart review, pt states she occasionally has random palpitations but received cardiac work up which was reportedly unremarkable PAIN:  Are you having pain: none (worst 2/10 in past week)  Per eval -  Best worst over past week: no pain over past week per pt    - aggravating factors: higher level balance, prolonged WB - Easing factors: rest, elevation   PRECAUTIONS: fall risk   WEIGHT BEARING RESTRICTIONS: None   FALLS:  Has patient fallen in last 6 months? Yes. Number of falls 1  fall, precipitating episode    LIVING ENVIRONMENT: 1 story 4 STE no rail Has axillary crutches and RW Lives w/ kids (4 years, 7 years, 32 years, 27 years old) and their father    OCCUPATION: Magazine features editor    PLOF: Independent   PATIENT GOALS: work on Development worker, international aid     NEXT MD VISIT: 6 weeks from eval    OBJECTIVE: (objective measures completed at initial evaluation unless otherwise dated)   DIAGNOSTIC FINDINGS:  S/p ORIF L ankle 03/03/22; WBAT   PATIENT SURVEYS:  FOTO 64 current, 79 predicted  10/26/22 FOTO 67    COGNITION: Overall cognitive status: Within functional limits for tasks assessed                          SENSATION: Denies sensory complaints    PALPATION: Tenderness distal gastroc/soleus complex   LOWER EXTREMITY ROM:   Active ROM Right eval Left eval Left 10/26/22  Hip flexion       Hip extension       Hip abduction       Hip adduction       Hip internal rotation       Hip external rotation       Knee flexion       Knee extension       Ankle dorsiflexion WFL A: -3  P: neutral  A: neutral Passive: 3 deg s   Ankle plantarflexion WFL     Ankle inversion   12   Ankle eversion   0     (Blank rows = not tested) Comments: ROM painless    LOWER EXTREMITY MMT:   MMT Right eval Left eval Left 10/26/22  Knee flexion       Knee extension       Ankle dorsiflexion 5   5   Ankle plantarflexion       Ankle inversion  4 3+  4- painful in lateral ankle    Ankle eversion  5  5    (Blank rows = not tested) Comments:  MMT painless, scored within available ROM     FUNCTIONAL TESTS:  5xSTS: 10.33sec no UE support  10/26/22: - 5xSTS 10.38 sec no UE support, no pain, symmetrical WB - stair assessment: reciprocal ascending without UE support, step to descending without UE support - is able to descend with reciprocal pattern but significantly altered kinematics         GAIT: Distance walked: within clinic Assistive device utilized: cane  Level of assistance: mod I  Comments: reduced WB through LLE with and without SPC. Increased lateral trunk lean, partial step through pattern   09/12/22 FGA:  -Item 1 Gait Level Surface: mild impairment 2  -Item 2 Change in Gait Speed: moderate impairment 1  -Item 3 Gait with Horizontal Head Turns: moderate impairment 1  -Item 4 Gait with Vertical Head Turns: moderate impairment 1 -Item 5 Gait with Pivot Turn: mild impairment 2  -Item 6 Step Over Obstacle: Normal 3 -Item 7 Gait with Narrow Base of Support: moderate impairment 1  -Item 8 Gait with Eyes Closed: Normal 3            -Item 9 Ambulating Backwards: mild impairment 2   -Item 10 Steps: moderate impairment 1   Total: 17/30  * Score of <=22/30 indicates that patient is at increased risk for falls.     10/26/22 FGA: (performed without AD) -Item 1 Gait Level Surface:  mild impairment 2  -  Item 2 Change in Gait Speed: Normal 3  -Item 3 Gait with Horizontal Head Turns: mild impairment 2  -Item 4 Gait with Vertical Head Turns: mild impairment 2  -Item 5 Gait with Pivot Turn: mild impairment 2  -Item 6 Step Over Obstacle: Normal 3  -Item 7 Gait with Narrow Base of Support: Normal 3 -Item 8 Gait with Eyes Closed: mild impairment 2             -Item 9 Ambulating Backwards: Normal 3  -Item 10 Steps: mild impairment 2 Total: 24 /30  * Score of <=22/30 indicates that patient is at increased risk for falls.      TODAY'S TREATMENT:  [facility] Adult PT Treatment:                                                DATE: 11/09/22 Therapeutic Exercise: Recumbent bike during subjective  Heel raises in // bars - 2x15 emphasis on slow velocity gradually building speed within comfort, mirror to assist w/ symmetrical WB  BW squat tap to chair x12 cues for symmetrical WB, gradually building velocity Mini squat 5# x8, 10# x8, cues for symmetrical WB  Blue step, fwd step down x10 LLE stance, LUE support  Blue step, lateral step down LLE stance, 2x10 w/ UE support  Reverse runner's lunge (LLE stance limb, reverse lunge into runner's stance RLE swing, LUE support) 2x10, emphasis on gradually building velocity through movement HEP update + education/handout (extra education provided on performing reverse lunge HEP w/ counter support, LLE stance limb only)    [facility] Adult PT Treatment:                                                DATE: 11/02/22 Therapeutic Exercise: BOSU flat surface wide static balance, A/P rocking and lateral weight shifting without UE support BOSU flat surface narrow static balance, A/P rocking without UE support BOSU dome surface narrow static balance  without UE, bilat hopping with UE Bosu step ups on dome with min UE support x 10 each  Bilat heel raises Single heel raises Exaggerated toe off in parallel bars to mimic jogging toe off mechanics advancing forward  Slant board stretch     [facility] Adult PT Treatment:                                                DATE: 10/26/22 Therapeutic Activity: MSK assessment + education FOTO + education FGA + education Stair assessment + education (4 laps on 4 stairs) Education/discussion re: progress with PT, symptom behavior as it affects activity tolerance, PT goals/POC, safety w/ mobility re: AD use (see assessment) Lunge onto 6 inch step x12 cues for appropriate setup and safety w/ home performance    PATIENT EDUCATION:  Education details: rationale for interventions, HEP Person educated: Patient Education method: Explanation, Demonstration, Tactile cues, Verbal cues, and Handouts Education comprehension: verbalized understanding, returned demonstration, verbal cues required, tactile cues required, and needs further education     HOME EXERCISE PROGRAM: Access Code: [email or web address] URL: [email or web address] Date: 11/09/2022 Prepared by: Fransisco Hertz  Exercises - Standing Gastroc Stretch  - 1 x daily - 7 x weekly - 1 sets - 10 reps - 5 hold - Standing Soleus Stretch  - 1 x daily - 7 x weekly - 1 sets - 10 reps - 5 hold - Mini Squat with Counter Support  - 1 x daily - 7 x weekly - 1-2 sets - 10 reps - Tandem Walking with Counter Support  - 1 x daily - 7 x weekly - 1-2 sets - 10 reps - Seated Heel Raise  - 1 x daily - 7 x weekly - 1-2 sets - 10 reps - Reverse Lunge  - 1 x daily - 7 x weekly - 1-2 sets - 10 reps   ASSESSMENT:   CLINICAL IMPRESSION: 11/09/2022 Pt arrives w/ no pain but does endorse some stiffness she attributes to colder weather. Voices strong motivation to return to higher impact activities such as running/zumba. Today focusing on building tolerance to  recommended pre-requisite activities, with addition of increased velocity heel raises and LLE stance limb reverse lunge into runner's stance. Emphasis on improving symmetry of WB and maintaining comfort with increased velocity. Pt tolerates quite well, does demonstrate increased asymmetry with bilateral movements at higher velocity but no increase in pain. No adverse events, pt reports feeling "much better" on departure. Recommend continuing along current POC in order to address relevant deficits and improve functional tolerance. Pt departs today's session in no acute distress, all voiced questions/concerns addressed appropriately from PT perspective.     Per Progress note: Pt arrives w/o pain - continues to endorse steady improvement overall, improving symptoms with movement. Continues to endorse concern with higher level balance and desire to return to running in the future. Objectively, pt continues to make slow/steady progress with ankle ROM/MMT. No significant improvement in 5xSTS today although her FGA score has improved notably - while she is no longer in fall risk category, she does continue to demonstrate some compensatory strategies mostly with dual tasking. No LOB or gross instability. Given her improvement in balance, we discuss beginning to wean from [facility] as comfortable, particularly in familiar or non-challenging environments. She does demonstrate some compensatory strategies (slowing down) in more complex environments or with dual tasking, so continuing to recommend [facility] use in novel or complex environments for now. In discussion with pt, she states she can see progress and would like to continue with remaining visits in POC in order to maximize her progress. Extended dates of POC accordingly as below. Pt endorses improving stiffness as session goes on, no pain on departure and no adverse events. Recommend continuing along updated POC in order to address relevant deficits and improve functional  tolerance. Pt departs today's session in no acute distress, all voiced questions/concerns addressed appropriately from PT perspective.    Per eval - Pt arrives w/o pain, endorses continued improvement since recent discharge, would really like to work more on higher level balance. Reports exercises have been going well. States she was encouraged by surgeon to continue with PT to work on higher level mobility/balance. Pt continues to demonstrate gait impairments, higher level postural stability deficits, and limitations in ankle mobility/strength as outlined above. Functional Gait Assessment scored at 17/30 today which is indicative of fall risk (cutoff score </= 22/30). Recommend skilled PT to address these impairments with aim of improving safety/tolerance to mobility. No adverse events, reinforced continuation of HEP from recent POC. Pt departs today's session in no acute distress, all voiced questions/concerns addressed appropriately from PT perspective.  OBJECTIVE IMPAIRMENTS: Abnormal gait, decreased activity tolerance, decreased balance, decreased endurance, decreased mobility, difficulty walking, decreased ROM, decreased strength   ACTIVITY LIMITATIONS: carrying, lifting, bending, standing, squatting, stairs, transfers, and locomotion level   PARTICIPATION LIMITATIONS: meal prep, cleaning, laundry, shopping, and community activity   PERSONAL FACTORS: Time since onset of injury/illness/exacerbation are also affecting patient's functional outcome.    REHAB POTENTIAL: Good   CLINICAL DECISION MAKING: Stable/uncomplicated   EVALUATION COMPLEXITY: Low     GOALS: Goals reviewed with patient? No   SHORT TERM GOALS: Target date: 10/10/2022 Pt will demonstrate appropriate understanding and performance of initially prescribed HEP in order to facilitate improved independence with management of symptoms.  Baseline: HEP reviewed from recent POC  10/05/22: pt reports good HEP adherence Goal  status: MET    2. Pt will score greater than or equal to 72 on FOTO in order to demonstrate improved perception of function due to symptoms.            Baseline: 64   10/05/22: follow up FOTO deferred as today is only visit 3  10/26/22: 67   Goal status: ONGOING  LONG TERM GOALS: Target date: 12/07/2022  (Updated 10/26/22) Pt will score 79 or greater on FOTO in order to demonstrate improved perception of function due to symptoms.  Baseline: 64  10/26/22: 67  Goal status: ONGOING    2.  Pt will demonstrate greater than 0 degrees of ankle DF AROM in order to facilitate improved tolerance to functional movements such as walking/stairs.  Baseline: see ROM chart above  10/26/22: able to actively achieve neutral  Goal status: NEARLY MET    3.  Pt will score greater than or equal to 22/30 on Functional Gait assessment in order to indicate reduced fall risk (cutoff score </= 22/30 predictive of falls per Alvino Chapel et al 2010, [facility] 4 pts Beninato et al 2014)  Baseline: 17/30  10/26/22: 24/30   Goal status: MET    4.  Pt will perform 5xSTS in </= 8 sec without UE support in order to demonstrate improved functional mobility ([facility] 2.3sec) Baseline: 10 sec without UE support  10/26/22: 10 sec no UE support   Goal status: ONGOING    5. Pt will demonstrate appropriate performance of final prescribed HEP in order to facilitate improved self-management of symptoms post-discharge.             Baseline: HEP reviewed from recent POC   10/26/22: reports good HEP adherence   Goal status: ONGOING    6. Pt will be able to safely navigate 4 stairs without UE support and with reciprocal gait pattern in order to facilitate improved home access.  Baseline: requiring rail, step to pattern per pt report  10/26/22: ascending reciprocal, descending step to   Goal status: PARTIALLY MET       PLAN: updated 10/26/22   PT FREQUENCY: 1x/week   PT DURATION: 6 weeks   PLANNED INTERVENTIONS: Therapeutic exercises,  Therapeutic activity, Neuromuscular re-education, Balance training, Gait training, Patient/Family education, Self Care, Joint mobilization, Stair training, Aquatic Therapy, Dry Needling, Electrical stimulation, Cryotherapy, Moist heat, Taping, Vasopneumatic device, Manual therapy, and Re-evaluation   PLAN FOR NEXT SESSION: review/update HEP PRN. Continue working on higher level balance - would likely benefit from more dynamic stability with dual tasking and environmental distractions. Pt states her end goal is to be able to run, even if only for short distance    Ashley Murrain PT, DPT 11/09/2022 10:27 AM

## 2022-11-09 ENCOUNTER — Encounter: Payer: Self-pay | Admitting: Physical Therapy

## 2022-11-09 ENCOUNTER — Ambulatory Visit: Payer: Self-pay | Admitting: Physical Therapy

## 2022-11-09 DIAGNOSIS — M25672 Stiffness of left ankle, not elsewhere classified: Secondary | ICD-10-CM

## 2022-11-09 DIAGNOSIS — R2689 Other abnormalities of gait and mobility: Secondary | ICD-10-CM

## 2022-11-09 DIAGNOSIS — M6281 Muscle weakness (generalized): Secondary | ICD-10-CM

## 2022-11-16 ENCOUNTER — Ambulatory Visit: Payer: Self-pay | Admitting: Physical Therapy

## 2022-11-22 NOTE — Therapy (Signed)
OUTPATIENT PHYSICAL THERAPY TREATMENT NOTE   Patient Name: Joanna Whitney MRN: [id number] DOB:1980-11-01, 42 y.o., female Today's Date: 11/23/2022         PCP: No PCP in chart   REFERRING PROVIDER: Teryl Lucy, MD   END OF SESSION:   PT End of Session - 11/23/22 0848     Visit Number 9    Number of Visits 13    Date for PT Re-Evaluation 12/07/22    Authorization Type self pay    PT Start Time 0849   late check in   PT Stop Time 0929    PT Time Calculation (min) 40 min    Activity Tolerance Patient tolerated treatment well;No increased pain                 Past Medical History:  Diagnosis Date   Ankle fracture    Left   GERD (gastroesophageal reflux disease)    Gestational diabetes    Hx of toxoplasmosis    Medical history non-contributory    Past Surgical History:  Procedure Laterality Date   NO PAST SURGERIES     ORIF ANKLE FRACTURE Left 03/03/2022   Procedure: OPEN REDUCTION INTERNAL FIXATION (ORIF) ANKLE FRACTURE;  Surgeon: Teryl Lucy, MD;  Location: [facility] OR;  Service: Orthopedics;  Laterality: Left;   SYNDESMOSIS REPAIR Left 03/03/2022   Procedure: SYNDESMOSIS REPAIR;  Surgeon: Teryl Lucy, MD;  Location: [facility] OR;  Service: Orthopedics;  Laterality: Left;   Patient Active Problem List   Diagnosis Date Noted   History of gestational diabetes 06/13/2017   Obesity (BMI 30-39.9) 03/09/2017   Language barrier 03/09/2017    REFERRING DIAG: S/P LEFT ANKLE ORIF DOS 03/03/22      THERAPY DIAG:  Other abnormalities of gait and mobility  Muscle weakness (generalized)  Stiffness of left ankle, not elsewhere classified  Rationale for Evaluation and Treatment Rehabilitation  ONSET DATE: 03/03/22 L ankle ORIF    SUBJECTIVE:   Per eval - Pt arrives after recent PT discharge on 08/18/22, was seen for ankle pain and mobility deficits s/p L ankle ORIF on 03/03/22. Pt followed up with her surgeon post discharge, states she was encouraged to continue  to participate with PT, does not think she will need any hardware removal. Sees them again in 6 weeks. States she continues to feel better with time and exercises. Reports very minimal swelling. Still using SPC most of the time but does occasionally endorse forgetting to use it in the home.   SUBJECTIVE STATEMENT: 11/23/2022 No pain at present, not using cane. Felt "much better" after last session, continues to feel better daily. Notes she forgot about her injury while rushing to get somewhere and took a few steps running - felt stiff but no increase in pain during or after, pt states it was encouraging.  Appreciate assistance from her son for interpreting.   Next MD visit: PRN per pt report     PERTINENT HISTORY: Unremarkable per chart review, pt states she occasionally has random palpitations but received cardiac work up which was reportedly unremarkable PAIN:  Are you having pain: none (worst 2/10 in past week)  Per eval -  Best worst over past week: no pain over past week per pt    - aggravating factors: higher level balance, prolonged WB - Easing factors: rest, elevation   PRECAUTIONS: fall risk   WEIGHT BEARING RESTRICTIONS: None   FALLS:  Has patient fallen in last 6 months? Yes. Number of falls 1 fall, precipitating episode  LIVING ENVIRONMENT: 1 story 4 STE no rail Has axillary crutches and RW Lives w/ kids (4 years, 7 years, 56 years, 92 years old) and their father    OCCUPATION: Magazine features editor    PLOF: Independent   PATIENT GOALS: work on Development worker, international aid     NEXT MD VISIT: 6 weeks from eval    OBJECTIVE: (objective measures completed at initial evaluation unless otherwise dated)   DIAGNOSTIC FINDINGS:  S/p ORIF L ankle 03/03/22; WBAT   PATIENT SURVEYS:  FOTO 64 current, 79 predicted  10/26/22 FOTO 67    COGNITION: Overall cognitive status: Within functional limits for tasks assessed                         SENSATION: Denies sensory complaints     PALPATION: Tenderness distal gastroc/soleus complex   LOWER EXTREMITY ROM:   Active ROM Right eval Left eval Left 10/26/22  Hip flexion       Hip extension       Hip abduction       Hip adduction       Hip internal rotation       Hip external rotation       Knee flexion       Knee extension       Ankle dorsiflexion WFL A: -3  P: neutral  A: neutral Passive: 3 deg s   Ankle plantarflexion WFL     Ankle inversion   12   Ankle eversion   0     (Blank rows = not tested) Comments: ROM painless    LOWER EXTREMITY MMT:   MMT Right eval Left eval Left 10/26/22  Knee flexion       Knee extension       Ankle dorsiflexion 5   5   Ankle plantarflexion       Ankle inversion  4 3+  4- painful in lateral ankle    Ankle eversion  5  5    (Blank rows = not tested) Comments:  MMT painless, scored within available ROM     FUNCTIONAL TESTS:  5xSTS: 10.33sec no UE support  10/26/22: - 5xSTS 10.38 sec no UE support, no pain, symmetrical WB - stair assessment: reciprocal ascending without UE support, step to descending without UE support - is able to descend with reciprocal pattern but significantly altered kinematics         GAIT: Distance walked: within clinic Assistive device utilized: cane  Level of assistance: mod I  Comments: reduced WB through LLE with and without SPC. Increased lateral trunk lean, partial step through pattern   09/12/22 FGA:  -Item 1 Gait Level Surface: mild impairment 2  -Item 2 Change in Gait Speed: moderate impairment 1  -Item 3 Gait with Horizontal Head Turns: moderate impairment 1  -Item 4 Gait with Vertical Head Turns: moderate impairment 1 -Item 5 Gait with Pivot Turn: mild impairment 2  -Item 6 Step Over Obstacle: Normal 3 -Item 7 Gait with Narrow Base of Support: moderate impairment 1  -Item 8 Gait with Eyes Closed: Normal 3            -Item 9 Ambulating Backwards: mild impairment 2  -Item 10 Steps: moderate impairment 1   Total: 17/30  *  Score of <=22/30 indicates that patient is at increased risk for falls.     10/26/22 FGA: (performed without AD) -Item 1 Gait Level Surface:  mild impairment 2  -Item 2 Change in Gait  Speed: Normal 3  -Item 3 Gait with Horizontal Head Turns: mild impairment 2  -Item 4 Gait with Vertical Head Turns: mild impairment 2  -Item 5 Gait with Pivot Turn: mild impairment 2  -Item 6 Step Over Obstacle: Normal 3  -Item 7 Gait with Narrow Base of Support: Normal 3 -Item 8 Gait with Eyes Closed: mild impairment 2             -Item 9 Ambulating Backwards: Normal 3  -Item 10 Steps: mild impairment 2 Total: 24 /30  * Score of <=22/30 indicates that patient is at increased risk for falls.      TODAY'S TREATMENT:  [facility] Adult PT Treatment:                                                DATE: 11/23/22 Therapeutic Exercise: Recumbent bike 5 min during subjective  Heel raises up on 2 down on 1 (LLE eccentric) 2x10 Rocking gastroc slantboard stretch x10 Staggered stance STS (LLE back) x15 from raised mat  HEP update + education/handout, time spent discussing relevant anatomy/physiology as it pertains to exercise interventions and gradual progression to higher level activities  Therapeutic Activity: BW mini squat x8 BIL cues for symmetry of WB and mechanics Retrowalking at counter 5 laps cues for increasing step length Heel shuffle weight shifts with increasing velocity, for improved WB tolerance at higher velocity; 2x30sec     [facility] Adult PT Treatment:                                                DATE: 11/09/22 Therapeutic Exercise: Recumbent bike during subjective  Heel raises in // bars - 2x15 emphasis on slow velocity gradually building speed within comfort, mirror to assist w/ symmetrical WB  BW squat tap to chair x12 cues for symmetrical WB, gradually building velocity Mini squat 5# x8, 10# x8, cues for symmetrical WB  Blue step, fwd step down x10 LLE stance, LUE support  Blue step,  lateral step down LLE stance, 2x10 w/ UE support  Reverse runner's lunge (LLE stance limb, reverse lunge into runner's stance RLE swing, LUE support) 2x10, emphasis on gradually building velocity through movement HEP update + education/handout (extra education provided on performing reverse lunge HEP w/ counter support, LLE stance limb only)    [facility] Adult PT Treatment:                                                DATE: 11/02/22 Therapeutic Exercise: BOSU flat surface wide static balance, A/P rocking and lateral weight shifting without UE support BOSU flat surface narrow static balance, A/P rocking without UE support BOSU dome surface narrow static balance without UE, bilat hopping with UE Bosu step ups on dome with min UE support x 10 each  Bilat heel raises Single heel raises Exaggerated toe off in parallel bars to mimic jogging toe off mechanics advancing forward  Slant board stretch   PATIENT EDUCATION:  Education details: rationale for interventions, HEP Person educated: Patient Education method: Explanation, Demonstration, Tactile cues, Verbal cues, and Handouts Education comprehension:  verbalized understanding, returned demonstration, verbal cues required, tactile cues required, and needs further education     HOME EXERCISE PROGRAM: Access Code: [email or web address] URL: [email or web address] Date: 11/23/2022 Prepared by: Fransisco Hertz  Exercises - Standing Gastroc Stretch  - 1 x daily - 7 x weekly - 1 sets - 10 reps - 5 hold - Standing Soleus Stretch  - 1 x daily - 7 x weekly - 1 sets - 10 reps - 5 hold - Seated Heel Raise  - 1 x daily - 7 x weekly - 1-2 sets - 10 reps - Reverse Lunge  - 1 x daily - 7 x weekly - 1-2 sets - 10 reps - Staggered Sit-to-Stand  - 1 x daily - 7 x weekly - 1-2 sets - 12 reps - Backward Walking with Counter Support  - 1 x daily - 7 x weekly - 1-2 sets - 3 reps   ASSESSMENT:   CLINICAL IMPRESSION: 11/23/2022 Pt arrives w/o pain, continues to  endorse steady progress and no issues after last sessions progressions. Given this, continuing to progress today for reducing asymmetries in WB positions and continuing progressions into higher velocity movements. Also incorporated backwards walking with emphasis on step length to improve toe/ankle mobility in functional positions. Tolerates well, no adverse events, denies any pain on departure and continues to endorse feeling better every time she leaves. Recommend continuing along current POC in order to address relevant deficits and improve functional tolerance. Pt departs today's session in no acute distress, all voiced questions/concerns addressed appropriately from PT perspective.     Per eval - Pt arrives w/o pain, endorses continued improvement since recent discharge, would really like to work more on higher level balance. Reports exercises have been going well. States she was encouraged by surgeon to continue with PT to work on higher level mobility/balance. Pt continues to demonstrate gait impairments, higher level postural stability deficits, and limitations in ankle mobility/strength as outlined above. Functional Gait Assessment scored at 17/30 today which is indicative of fall risk (cutoff score </= 22/30). Recommend skilled PT to address these impairments with aim of improving safety/tolerance to mobility. No adverse events, reinforced continuation of HEP from recent POC. Pt departs today's session in no acute distress, all voiced questions/concerns addressed appropriately from PT perspective.      OBJECTIVE IMPAIRMENTS: Abnormal gait, decreased activity tolerance, decreased balance, decreased endurance, decreased mobility, difficulty walking, decreased ROM, decreased strength   ACTIVITY LIMITATIONS: carrying, lifting, bending, standing, squatting, stairs, transfers, and locomotion level   PARTICIPATION LIMITATIONS: meal prep, cleaning, laundry, shopping, and community activity   PERSONAL  FACTORS: Time since onset of injury/illness/exacerbation are also affecting patient's functional outcome.    REHAB POTENTIAL: Good   CLINICAL DECISION MAKING: Stable/uncomplicated   EVALUATION COMPLEXITY: Low     GOALS: Goals reviewed with patient? No   SHORT TERM GOALS: Target date: 10/10/2022 Pt will demonstrate appropriate understanding and performance of initially prescribed HEP in order to facilitate improved independence with management of symptoms.  Baseline: HEP reviewed from recent POC  10/05/22: pt reports good HEP adherence Goal status: MET    2. Pt will score greater than or equal to 72 on FOTO in order to demonstrate improved perception of function due to symptoms.            Baseline: 64   10/05/22: follow up FOTO deferred as today is only visit 3  10/26/22: 67   Goal status: ONGOING  LONG TERM GOALS: Target date: 12/07/2022  (Updated 10/26/22) Pt  will score 79 or greater on FOTO in order to demonstrate improved perception of function due to symptoms.  Baseline: 64  10/26/22: 67  Goal status: ONGOING    2.  Pt will demonstrate greater than 0 degrees of ankle DF AROM in order to facilitate improved tolerance to functional movements such as walking/stairs.  Baseline: see ROM chart above  10/26/22: able to actively achieve neutral  Goal status: NEARLY MET    3.  Pt will score greater than or equal to 22/30 on Functional Gait assessment in order to indicate reduced fall risk (cutoff score </= 22/30 predictive of falls per Alvino Chapel et al 2010, [facility] 4 pts Beninato et al 2014)  Baseline: 17/30  10/26/22: 24/30   Goal status: MET    4.  Pt will perform 5xSTS in </= 8 sec without UE support in order to demonstrate improved functional mobility ([facility] 2.3sec) Baseline: 10 sec without UE support  10/26/22: 10 sec no UE support   Goal status: ONGOING    5. Pt will demonstrate appropriate performance of final prescribed HEP in order to facilitate improved self-management of  symptoms post-discharge.             Baseline: HEP reviewed from recent POC   10/26/22: reports good HEP adherence   Goal status: ONGOING    6. Pt will be able to safely navigate 4 stairs without UE support and with reciprocal gait pattern in order to facilitate improved home access.  Baseline: requiring rail, step to pattern per pt report  10/26/22: ascending reciprocal, descending step to   Goal status: PARTIALLY MET       PLAN: updated 10/26/22   PT FREQUENCY: 1x/week   PT DURATION: 6 weeks   PLANNED INTERVENTIONS: Therapeutic exercises, Therapeutic activity, Neuromuscular re-education, Balance training, Gait training, Patient/Family education, Self Care, Joint mobilization, Stair training, Aquatic Therapy, Dry Needling, Electrical stimulation, Cryotherapy, Moist heat, Taping, Vasopneumatic device, Manual therapy, and Re-evaluation   PLAN FOR NEXT SESSION: review/update HEP PRN. Continue working on higher level balance - would likely benefit from more dynamic stability with dual tasking and environmental distractions. Pt states her end goal is to be able to run, even if only for short distance    Ashley Murrain PT, DPT 11/23/2022 9:33 AM

## 2022-11-23 ENCOUNTER — Encounter: Payer: Self-pay | Admitting: Physical Therapy

## 2022-11-23 ENCOUNTER — Ambulatory Visit: Payer: Self-pay | Admitting: Physical Therapy

## 2022-11-23 DIAGNOSIS — M6281 Muscle weakness (generalized): Secondary | ICD-10-CM

## 2022-11-23 DIAGNOSIS — M25672 Stiffness of left ankle, not elsewhere classified: Secondary | ICD-10-CM

## 2022-11-23 DIAGNOSIS — R2689 Other abnormalities of gait and mobility: Secondary | ICD-10-CM

## 2022-11-30 ENCOUNTER — Encounter: Payer: Self-pay | Admitting: Physical Therapy

## 2022-11-30 ENCOUNTER — Ambulatory Visit: Payer: Self-pay | Attending: Orthopedic Surgery | Admitting: Physical Therapy

## 2022-11-30 DIAGNOSIS — M25672 Stiffness of left ankle, not elsewhere classified: Secondary | ICD-10-CM | POA: Insufficient documentation

## 2022-11-30 DIAGNOSIS — M6281 Muscle weakness (generalized): Secondary | ICD-10-CM | POA: Insufficient documentation

## 2022-11-30 DIAGNOSIS — R2689 Other abnormalities of gait and mobility: Secondary | ICD-10-CM | POA: Insufficient documentation

## 2022-11-30 NOTE — Therapy (Signed)
OUTPATIENT PHYSICAL THERAPY TREATMENT NOTE   Patient Name: Joanna Whitney MRN: [id number] DOB:March 16, 1980, 42 y.o., female Today's Date: 11/30/2022         PCP: No PCP in chart   REFERRING PROVIDER: Teryl Lucy, MD   END OF SESSION:   PT End of Session - 11/30/22 0936     Visit Number 10    Number of Visits 13    Date for PT Re-Evaluation 12/07/22    Authorization Type self pay    PT Start Time 0932    PT Stop Time 1013    PT Time Calculation (min) 41 min                 Past Medical History:  Diagnosis Date   Ankle fracture    Left   GERD (gastroesophageal reflux disease)    Gestational diabetes    Hx of toxoplasmosis    Medical history non-contributory    Past Surgical History:  Procedure Laterality Date   NO PAST SURGERIES     ORIF ANKLE FRACTURE Left 03/03/2022   Procedure: OPEN REDUCTION INTERNAL FIXATION (ORIF) ANKLE FRACTURE;  Surgeon: Teryl Lucy, MD;  Location: [facility] OR;  Service: Orthopedics;  Laterality: Left;   SYNDESMOSIS REPAIR Left 03/03/2022   Procedure: SYNDESMOSIS REPAIR;  Surgeon: Teryl Lucy, MD;  Location: [facility] OR;  Service: Orthopedics;  Laterality: Left;   Patient Active Problem List   Diagnosis Date Noted   History of gestational diabetes 06/13/2017   Obesity (BMI 30-39.9) 03/09/2017   Language barrier 03/09/2017    REFERRING DIAG: S/P LEFT ANKLE ORIF DOS 03/03/22      THERAPY DIAG:  Other abnormalities of gait and mobility  Muscle weakness (generalized)  Rationale for Evaluation and Treatment Rehabilitation  ONSET DATE: 03/03/22 L ankle ORIF    SUBJECTIVE:   Per eval - Pt arrives after recent PT discharge on 08/18/22, was seen for ankle pain and mobility deficits s/p L ankle ORIF on 03/03/22. Pt followed up with her surgeon post discharge, states she was encouraged to continue to participate with PT, does not think she will need any hardware removal. Sees them again in 6 weeks. States she continues to feel better with  time and exercises. Reports very minimal swelling. Still using SPC most of the time but does occasionally endorse forgetting to use it in the home.   SUBJECTIVE STATEMENT: 11/30/2022 No pain reports she continues to feel improvement in walking.   Appreciate assistance from her son for interpreting.   Next MD visit: PRN per pt report     PERTINENT HISTORY: Unremarkable per chart review, pt states she occasionally has random palpitations but received cardiac work up which was reportedly unremarkable PAIN:  Are you having pain: none (worst 2/10 in past week)  Per eval -  Best worst over past week: no pain over past week per pt    - aggravating factors: higher level balance, prolonged WB - Easing factors: rest, elevation   PRECAUTIONS: fall risk   WEIGHT BEARING RESTRICTIONS: None   FALLS:  Has patient fallen in last 6 months? Yes. Number of falls 1 fall, precipitating episode    LIVING ENVIRONMENT: 1 story 4 STE no rail Has axillary crutches and RW Lives w/ kids (4 years, 7 years, 14 years, 35 years old) and their father    OCCUPATION: Magazine features editor    PLOF: Independent   PATIENT GOALS: work on Development worker, international aid     NEXT MD VISIT: 6 weeks from eval    OBJECTIVE: (  objective measures completed at initial evaluation unless otherwise dated)   DIAGNOSTIC FINDINGS:  S/p ORIF L ankle 03/03/22; WBAT   PATIENT SURVEYS:  FOTO 64 current, 79 predicted  10/26/22 FOTO 67    COGNITION: Overall cognitive status: Within functional limits for tasks assessed                         SENSATION: Denies sensory complaints    PALPATION: Tenderness distal gastroc/soleus complex   LOWER EXTREMITY ROM:   Active ROM Right eval Left eval Left 10/26/22  Hip flexion       Hip extension       Hip abduction       Hip adduction       Hip internal rotation       Hip external rotation       Knee flexion       Knee extension       Ankle dorsiflexion WFL A: -3  P: neutral  A:  neutral Passive: 3 deg s   Ankle plantarflexion WFL     Ankle inversion   12   Ankle eversion   0     (Blank rows = not tested) Comments: ROM painless    LOWER EXTREMITY MMT:   MMT Right eval Left eval Left 10/26/22  Knee flexion       Knee extension       Ankle dorsiflexion 5   5   Ankle plantarflexion       Ankle inversion  4 3+  4- painful in lateral ankle    Ankle eversion  5  5    (Blank rows = not tested) Comments:  MMT painless, scored within available ROM     FUNCTIONAL TESTS:  5xSTS: 10.33sec no UE support  10/26/22: - 5xSTS 10.38 sec no UE support, no pain, symmetrical WB - stair assessment: reciprocal ascending without UE support, step to descending without UE support - is able to descend with reciprocal pattern but significantly altered kinematics  11/30/22: 7.76 sec without UE , bariatric chair without UE         GAIT: Distance walked: within clinic Assistive device utilized: cane  Level of assistance: mod I  Comments: reduced WB through LLE with and without SPC. Increased lateral trunk lean, partial step through pattern   09/12/22 FGA:  -Item 1 Gait Level Surface: mild impairment 2  -Item 2 Change in Gait Speed: moderate impairment 1  -Item 3 Gait with Horizontal Head Turns: moderate impairment 1  -Item 4 Gait with Vertical Head Turns: moderate impairment 1 -Item 5 Gait with Pivot Turn: mild impairment 2  -Item 6 Step Over Obstacle: Normal 3 -Item 7 Gait with Narrow Base of Support: moderate impairment 1  -Item 8 Gait with Eyes Closed: Normal 3            -Item 9 Ambulating Backwards: mild impairment 2  -Item 10 Steps: moderate impairment 1   Total: 17/30  * Score of <=22/30 indicates that patient is at increased risk for falls.     10/26/22 FGA: (performed without AD) -Item 1 Gait Level Surface:  mild impairment 2  -Item 2 Change in Gait Speed: Normal 3  -Item 3 Gait with Horizontal Head Turns: mild impairment 2  -Item 4 Gait with Vertical Head  Turns: mild impairment 2  -Item 5 Gait with Pivot Turn: mild impairment 2  -Item 6 Step Over Obstacle: Normal 3  -Item 7 Gait with Narrow  Base of Support: Normal 3 -Item 8 Gait with Eyes Closed: mild impairment 2             -Item 9 Ambulating Backwards: Normal 3  -Item 10 Steps: mild impairment 2 Total: 24 /30  * Score of <=22/30 indicates that patient is at increased risk for falls.      TODAY'S TREATMENT:  [facility] Adult PT Treatment:                                                DATE: 11/30/22 Therapeutic Exercise: Elliptical Ramp 3 Level 3  Retro stepping at counter  Reverse runner's lunge (LLE stance limb, reverse lunge into runner's stance RLE swing, LUE support) 2x10 each way , emphasis on gradually building velocity through movement Jump board on Reformer  Hopping in parallel bars with bil UE support  Hopping on mini tramp in corner- progressing height, cues for symmetrical landing. Forward step down  Side step down Tandem on AIREX forward and backward x 3 each  SLS on airex trials  Slant board stretch    [facility] Adult PT Treatment:                                                DATE: 11/23/22 Therapeutic Exercise: Recumbent bike 5 min during subjective  Heel raises up on 2 down on 1 (LLE eccentric) 2x10 Rocking gastroc slantboard stretch x10 Staggered stance STS (LLE back) x15 from raised mat  HEP update + education/handout, time spent discussing relevant anatomy/physiology as it pertains to exercise interventions and gradual progression to higher level activities  Therapeutic Activity: BW mini squat x8 BIL cues for symmetry of WB and mechanics Retrowalking at counter 5 laps cues for increasing step length Heel shuffle weight shifts with increasing velocity, for improved WB tolerance at higher velocity; 2x30sec     [facility] Adult PT Treatment:                                                DATE: 11/09/22 Therapeutic Exercise: Recumbent bike during subjective  Heel  raises in // bars - 2x15 emphasis on slow velocity gradually building speed within comfort, mirror to assist w/ symmetrical WB  BW squat tap to chair x12 cues for symmetrical WB, gradually building velocity Mini squat 5# x8, 10# x8, cues for symmetrical WB  Blue step, fwd step down x10 LLE stance, LUE support  Blue step, lateral step down LLE stance, 2x10 w/ UE support  Reverse runner's lunge (LLE stance limb, reverse lunge into runner's stance RLE swing, LUE support) 2x10, emphasis on gradually building velocity through movement HEP update + education/handout (extra education provided on performing reverse lunge HEP w/ counter support, LLE stance limb only)    [facility] Adult PT Treatment:                                                DATE: 11/02/22 Therapeutic Exercise: BOSU flat surface wide  static balance, A/P rocking and lateral weight shifting without UE support BOSU flat surface narrow static balance, A/P rocking without UE support BOSU dome surface narrow static balance without UE, bilat hopping with UE Bosu step ups on dome with min UE support x 10 each  Bilat heel raises Single heel raises Exaggerated toe off in parallel bars to mimic jogging toe off mechanics advancing forward  Slant board stretch   PATIENT EDUCATION:  Education details: rationale for interventions, HEP Person educated: Patient Education method: Explanation, Demonstration, Tactile cues, Verbal cues, and Handouts Education comprehension: verbalized understanding, returned demonstration, verbal cues required, tactile cues required, and needs further education     HOME EXERCISE PROGRAM: Access Code: [email or web address] URL: [email or web address] Date: 11/23/2022 Prepared by: Fransisco Hertz  Exercises - Standing Gastroc Stretch  - 1 x daily - 7 x weekly - 1 sets - 10 reps - 5 hold - Standing Soleus Stretch  - 1 x daily - 7 x weekly - 1 sets - 10 reps - 5 hold - Seated Heel Raise  - 1 x daily - 7 x weekly -  1-2 sets - 10 reps - Reverse Lunge  - 1 x daily - 7 x weekly - 1-2 sets - 10 reps - Staggered Sit-to-Stand  - 1 x daily - 7 x weekly - 1-2 sets - 12 reps - Backward Walking with Counter Support  - 1 x daily - 7 x weekly - 1-2 sets - 3 reps   ASSESSMENT:   CLINICAL IMPRESSION: 11/30/2022 Pt arrives w/o pain, continues to endorse steady progress and no issues after last sessions progressions. Given this, continuing to progress today for reducing asymmetries in WB positions  using Reformer and mini trampoline to reduce gravity. Pt pleased with ability to hop in modified positions.  Consider resisted gait next visit. Consider T.M walking to increase speed.       Per eval - Pt arrives w/o pain, endorses continued improvement since recent discharge, would really like to work more on higher level balance. Reports exercises have been going well. States she was encouraged by surgeon to continue with PT to work on higher level mobility/balance. Pt continues to demonstrate gait impairments, higher level postural stability deficits, and limitations in ankle mobility/strength as outlined above. Functional Gait Assessment scored at 17/30 today which is indicative of fall risk (cutoff score </= 22/30). Recommend skilled PT to address these impairments with aim of improving safety/tolerance to mobility. No adverse events, reinforced continuation of HEP from recent POC. Pt departs today's session in no acute distress, all voiced questions/concerns addressed appropriately from PT perspective.      OBJECTIVE IMPAIRMENTS: Abnormal gait, decreased activity tolerance, decreased balance, decreased endurance, decreased mobility, difficulty walking, decreased ROM, decreased strength   ACTIVITY LIMITATIONS: carrying, lifting, bending, standing, squatting, stairs, transfers, and locomotion level   PARTICIPATION LIMITATIONS: meal prep, cleaning, laundry, shopping, and community activity   PERSONAL FACTORS: Time since  onset of injury/illness/exacerbation are also affecting patient's functional outcome.    REHAB POTENTIAL: Good   CLINICAL DECISION MAKING: Stable/uncomplicated   EVALUATION COMPLEXITY: Low     GOALS: Goals reviewed with patient? No   SHORT TERM GOALS: Target date: 10/10/2022 Pt will demonstrate appropriate understanding and performance of initially prescribed HEP in order to facilitate improved independence with management of symptoms.  Baseline: HEP reviewed from recent POC  10/05/22: pt reports good HEP adherence Goal status: MET    2. Pt will score greater than or equal to 72 on FOTO in  order to demonstrate improved perception of function due to symptoms.            Baseline: 64   10/05/22: follow up FOTO deferred as today is only visit 3  10/26/22: 67   Goal status: ONGOING  LONG TERM GOALS: Target date: 12/07/2022  (Updated 10/26/22) Pt will score 79 or greater on FOTO in order to demonstrate improved perception of function due to symptoms.  Baseline: 64  10/26/22: 67  Goal status: ONGOING    2.  Pt will demonstrate greater than 0 degrees of ankle DF AROM in order to facilitate improved tolerance to functional movements such as walking/stairs.  Baseline: see ROM chart above  10/26/22: able to actively achieve neutral  Goal status: NEARLY MET    3.  Pt will score greater than or equal to 22/30 on Functional Gait assessment in order to indicate reduced fall risk (cutoff score </= 22/30 predictive of falls per Alvino Chapel et al 2010, [facility] 4 pts Beninato et al 2014)  Baseline: 17/30  10/26/22: 24/30   Goal status: MET    4.  Pt will perform 5xSTS in </= 8 sec without UE support in order to demonstrate improved functional mobility ([facility] 2.3sec) Baseline: 10 sec without UE support  10/26/22: 10 sec no UE support   11/30/22: 7.76 sec  Goal status: MET   5. Pt will demonstrate appropriate performance of final prescribed HEP in order to facilitate improved self-management of symptoms  post-discharge.             Baseline: HEP reviewed from recent POC   10/26/22: reports good HEP adherence   Goal status: ONGOING    6. Pt will be able to safely navigate 4 stairs without UE support and with reciprocal gait pattern in order to facilitate improved home access.  Baseline: requiring rail, step to pattern per pt report  10/26/22: ascending reciprocal, descending step to   Goal status: PARTIALLY MET       PLAN: updated 10/26/22   PT FREQUENCY: 1x/week   PT DURATION: 6 weeks   PLANNED INTERVENTIONS: Therapeutic exercises, Therapeutic activity, Neuromuscular re-education, Balance training, Gait training, Patient/Family education, Self Care, Joint mobilization, Stair training, Aquatic Therapy, Dry Needling, Electrical stimulation, Cryotherapy, Moist heat, Taping, Vasopneumatic device, Manual therapy, and Re-evaluation   PLAN FOR NEXT SESSION: review/update HEP PRN. Continue working on higher level balance - would likely benefit from more dynamic stability with dual tasking and environmental distractions. Pt states her end goal is to be able to run, even if only for short distance    Jannette Spanner, [place] 11/30/22 10:34 AM Phone: [phone] Fax: [phone]

## 2022-12-07 ENCOUNTER — Ambulatory Visit: Payer: Self-pay

## 2022-12-07 ENCOUNTER — Telehealth: Payer: Self-pay

## 2022-12-07 NOTE — Telephone Encounter (Signed)
Spoke to pt's son re: no show appt. He stated they had an unexpected emergency. Reminded him of the pt's upcoming last appt.

## 2022-12-13 NOTE — Therapy (Signed)
OUTPATIENT PHYSICAL THERAPY PROGRESS NOTE + RECERTIFICATION   Patient Name: Joanna Whitney MRN: [id number] DOB:07/23/1980, 42 y.o., female Today's Date: 12/13/2022  Progress Note Reporting Period 10/26/22 to 12/14/22  See note below for Objective Data and Assessment of Progress/Goals.           PCP: No PCP in chart   REFERRING PROVIDER: Teryl Lucy, MD   END OF SESSION:         Past Medical History:  Diagnosis Date   Ankle fracture    Left   GERD (gastroesophageal reflux disease)    Gestational diabetes    Hx of toxoplasmosis    Medical history non-contributory    Past Surgical History:  Procedure Laterality Date   NO PAST SURGERIES     ORIF ANKLE FRACTURE Left 03/03/2022   Procedure: OPEN REDUCTION INTERNAL FIXATION (ORIF) ANKLE FRACTURE;  Surgeon: Teryl Lucy, MD;  Location: [facility] OR;  Service: Orthopedics;  Laterality: Left;   SYNDESMOSIS REPAIR Left 03/03/2022   Procedure: SYNDESMOSIS REPAIR;  Surgeon: Teryl Lucy, MD;  Location: [facility] OR;  Service: Orthopedics;  Laterality: Left;   Patient Active Problem List   Diagnosis Date Noted   History of gestational diabetes 06/13/2017   Obesity (BMI 30-39.9) 03/09/2017   Language barrier 03/09/2017    REFERRING DIAG: S/P LEFT ANKLE ORIF DOS 03/03/22      THERAPY DIAG:  No diagnosis found.  Rationale for Evaluation and Treatment Rehabilitation  ONSET DATE: 03/03/22 L ankle ORIF    SUBJECTIVE:   Per eval - Pt arrives after recent PT discharge on 08/18/22, was seen for ankle pain and mobility deficits s/p L ankle ORIF on 03/03/22. Pt followed up with her surgeon post discharge, states she was encouraged to continue to participate with PT, does not think she will need any hardware removal. Sees them again in 6 weeks. States she continues to feel better with time and exercises. Reports very minimal swelling. Still using SPC most of the time but does occasionally endorse forgetting to use it in the home.    SUBJECTIVE STATEMENT: 12/13/2022 ***  *** No pain reports she continues to feel improvement in walking.   Appreciate assistance from her son for interpreting.   Next MD visit: PRN per pt report     PERTINENT HISTORY: Unremarkable per chart review, pt states she occasionally has random palpitations but received cardiac work up which was reportedly unremarkable PAIN:  Are you having pain: none (worst 2/10 in past week)  Per eval -  Best worst over past week: no pain over past week per pt    - aggravating factors: higher level balance, prolonged WB - Easing factors: rest, elevation   PRECAUTIONS: fall risk   WEIGHT BEARING RESTRICTIONS: None   FALLS:  Has patient fallen in last 6 months? Yes. Number of falls 1 fall, precipitating episode    LIVING ENVIRONMENT: 1 story 4 STE no rail Has axillary crutches and RW Lives w/ kids (4 years, 7 years, 43 years, 52 years old) and their father    OCCUPATION: Magazine features editor    PLOF: Independent   PATIENT GOALS: work on balance       OBJECTIVE: (objective measures completed at initial evaluation unless otherwise dated)   DIAGNOSTIC FINDINGS:  S/p ORIF L ankle 03/03/22; WBAT   PATIENT SURVEYS:  FOTO 64 current, 79 predicted  10/26/22 FOTO 67  12/14/22 FOTO ***    COGNITION: Overall cognitive status: Within functional limits for tasks assessed  SENSATION: Denies sensory complaints    PALPATION: Tenderness distal gastroc/soleus complex   LOWER EXTREMITY ROM:   Active ROM Right eval Left eval Left 10/26/22 Left 12/14/22  Hip flexion        Hip extension        Hip abduction        Hip adduction        Hip internal rotation        Hip external rotation        Knee flexion        Knee extension        Ankle dorsiflexion WFL A: -3  P: neutral  A: neutral Passive: 3 deg s  ***  Ankle plantarflexion WFL      Ankle inversion   12    Ankle eversion   0      (Blank rows = not  tested) Comments: ROM painless    LOWER EXTREMITY MMT:   MMT Right eval Left eval Left 10/26/22 Left 12/14/22  Knee flexion        Knee extension        Ankle dorsiflexion 5   5    Ankle plantarflexion        Ankle inversion  4 3+  4- painful in lateral ankle   ***   Ankle eversion  5  5     (Blank rows = not tested) Comments:  MMT painless, scored within available ROM     FUNCTIONAL TESTS:  5xSTS: 10.33sec no UE support  10/26/22: - 5xSTS 10.38 sec no UE support, no pain, symmetrical WB - stair assessment: reciprocal ascending without UE support, step to descending without UE support - is able to descend with reciprocal pattern but significantly altered kinematics  11/30/22: 7.76 sec without UE , bariatric chair without UE         GAIT: Distance walked: within clinic Assistive device utilized: cane  Level of assistance: mod I  Comments: reduced WB through LLE with and without SPC. Increased lateral trunk lean, partial step through pattern   09/12/22 FGA:  -Item 1 Gait Level Surface: mild impairment 2  -Item 2 Change in Gait Speed: moderate impairment 1  -Item 3 Gait with Horizontal Head Turns: moderate impairment 1  -Item 4 Gait with Vertical Head Turns: moderate impairment 1 -Item 5 Gait with Pivot Turn: mild impairment 2  -Item 6 Step Over Obstacle: Normal 3 -Item 7 Gait with Narrow Base of Support: moderate impairment 1  -Item 8 Gait with Eyes Closed: Normal 3            -Item 9 Ambulating Backwards: mild impairment 2  -Item 10 Steps: moderate impairment 1   Total: 17/30  * Score of <=22/30 indicates that patient is at increased risk for falls.     10/26/22 FGA: (performed without AD) -Item 1 Gait Level Surface:  mild impairment 2  -Item 2 Change in Gait Speed: Normal 3  -Item 3 Gait with Horizontal Head Turns: mild impairment 2  -Item 4 Gait with Vertical Head Turns: mild impairment 2  -Item 5 Gait with Pivot Turn: mild impairment 2  -Item 6 Step Over  Obstacle: Normal 3  -Item 7 Gait with Narrow Base of Support: Normal 3 -Item 8 Gait with Eyes Closed: mild impairment 2             -Item 9 Ambulating Backwards: Normal 3  -Item 10 Steps: mild impairment 2 Total: 24 /30  * Score of <=22/30 indicates  that patient is at increased risk for falls.      TODAY'S TREATMENT:  [facility] Adult PT Treatment:                                                DATE: 12/14/22 Therapeutic Exercise: *** Manual Therapy: *** Neuromuscular re-ed: *** Therapeutic Activity: *** Modalities: *** Self Care: ***    [facility] Adult PT Treatment:                                                DATE: 11/30/22 Therapeutic Exercise: Elliptical Ramp 3 Level 3  Retro stepping at counter  Reverse runner's lunge (LLE stance limb, reverse lunge into runner's stance RLE swing, LUE support) 2x10 each way , emphasis on gradually building velocity through movement Jump board on Reformer  Hopping in parallel bars with bil UE support  Hopping on mini tramp in corner- progressing height, cues for symmetrical landing. Forward step down  Side step down Tandem on AIREX forward and backward x 3 each  SLS on airex trials  Slant board stretch    [facility] Adult PT Treatment:                                                DATE: 11/23/22 Therapeutic Exercise: Recumbent bike 5 min during subjective  Heel raises up on 2 down on 1 (LLE eccentric) 2x10 Rocking gastroc slantboard stretch x10 Staggered stance STS (LLE back) x15 from raised mat  HEP update + education/handout, time spent discussing relevant anatomy/physiology as it pertains to exercise interventions and gradual progression to higher level activities  Therapeutic Activity: BW mini squat x8 BIL cues for symmetry of WB and mechanics Retrowalking at counter 5 laps cues for increasing step length Heel shuffle weight shifts with increasing velocity, for improved WB tolerance at higher velocity; 2x30sec     [facility] Adult PT  Treatment:                                                DATE: 11/09/22 Therapeutic Exercise: Recumbent bike during subjective  Heel raises in // bars - 2x15 emphasis on slow velocity gradually building speed within comfort, mirror to assist w/ symmetrical WB  BW squat tap to chair x12 cues for symmetrical WB, gradually building velocity Mini squat 5# x8, 10# x8, cues for symmetrical WB  Blue step, fwd step down x10 LLE stance, LUE support  Blue step, lateral step down LLE stance, 2x10 w/ UE support  Reverse runner's lunge (LLE stance limb, reverse lunge into runner's stance RLE swing, LUE support) 2x10, emphasis on gradually building velocity through movement HEP update + education/handout (extra education provided on performing reverse lunge HEP w/ counter support, LLE stance limb only)     PATIENT EDUCATION:  Education details: rationale for interventions, HEP Person educated: Patient Education method: Explanation, Demonstration, Tactile cues, Verbal cues, and Handouts Education comprehension: verbalized understanding, returned demonstration, verbal cues required, tactile cues  required, and needs further education     HOME EXERCISE PROGRAM: Access Code: [email or web address] URL: [email or web address] Date: 11/23/2022 Prepared by: Fransisco Hertz  Exercises - Standing Gastroc Stretch  - 1 x daily - 7 x weekly - 1 sets - 10 reps - 5 hold - Standing Soleus Stretch  - 1 x daily - 7 x weekly - 1 sets - 10 reps - 5 hold - Seated Heel Raise  - 1 x daily - 7 x weekly - 1-2 sets - 10 reps - Reverse Lunge  - 1 x daily - 7 x weekly - 1-2 sets - 10 reps - Staggered Sit-to-Stand  - 1 x daily - 7 x weekly - 1-2 sets - 12 reps - Backward Walking with Counter Support  - 1 x daily - 7 x weekly - 1-2 sets - 3 reps   ASSESSMENT:   CLINICAL IMPRESSION: 12/13/2022 ***  *** Pt arrives w/o pain, continues to endorse steady progress and no issues after last sessions progressions. Given this,  continuing to progress today for reducing asymmetries in WB positions  using Reformer and mini trampoline to reduce gravity. Pt pleased with ability to hop in modified positions.  Consider resisted gait next visit. Consider T.M walking to increase speed.       Per eval - Pt arrives w/o pain, endorses continued improvement since recent discharge, would really like to work more on higher level balance. Reports exercises have been going well. States she was encouraged by surgeon to continue with PT to work on higher level mobility/balance. Pt continues to demonstrate gait impairments, higher level postural stability deficits, and limitations in ankle mobility/strength as outlined above. Functional Gait Assessment scored at 17/30 today which is indicative of fall risk (cutoff score </= 22/30). Recommend skilled PT to address these impairments with aim of improving safety/tolerance to mobility. No adverse events, reinforced continuation of HEP from recent POC. Pt departs today's session in no acute distress, all voiced questions/concerns addressed appropriately from PT perspective.      OBJECTIVE IMPAIRMENTS: Abnormal gait, decreased activity tolerance, decreased balance, decreased endurance, decreased mobility, difficulty walking, decreased ROM, decreased strength   ACTIVITY LIMITATIONS: carrying, lifting, bending, standing, squatting, stairs, transfers, and locomotion level   PARTICIPATION LIMITATIONS: meal prep, cleaning, laundry, shopping, and community activity   PERSONAL FACTORS: Time since onset of injury/illness/exacerbation are also affecting patient's functional outcome.    REHAB POTENTIAL: Good   CLINICAL DECISION MAKING: Stable/uncomplicated   EVALUATION COMPLEXITY: Low     GOALS: Goals reviewed with patient? No   SHORT TERM GOALS: Target date: 10/10/2022 Pt will demonstrate appropriate understanding and performance of initially prescribed HEP in order to facilitate improved  independence with management of symptoms.  Baseline: HEP reviewed from recent POC  10/05/22: pt reports good HEP adherence Goal status: MET    2. Pt will score greater than or equal to 72 on FOTO in order to demonstrate improved perception of function due to symptoms.            Baseline: 64   10/05/22: follow up FOTO deferred as today is only visit 3  10/26/22: 67   12/14/22: ***   Goal status: ***   LONG TERM GOALS: Target date: 12/07/2022 ***   (Updated 12/14/22) Pt will score 79 or greater on FOTO in order to demonstrate improved perception of function due to symptoms.  Baseline: 64  10/26/22: 67  12/14/22: ***  Goal status: ***    2.  Pt will demonstrate greater than  0 degrees of ankle DF AROM in order to facilitate improved tolerance to functional movements such as walking/stairs.  Baseline: see ROM chart above  10/26/22: able to actively achieve neutral  12/14/22: ***  Goal status: ***    3.  Pt will score greater than or equal to 22/30 on Functional Gait assessment in order to indicate reduced fall risk (cutoff score </= 22/30 predictive of falls per Alvino Chapel et al 2010, [facility]ID 4 pts Beninato et al 2014)  Baseline: 17/30  10/26/22: 24/30   Goal status: MET    4.  Pt will perform 5xSTS in </= 8 sec without UE support in order to demonstrate improved functional mobility (MCID 2.3sec) Baseline: 10 sec without UE support  10/26/22: 10 sec no UE support   11/30/22: 7.76 sec  Goal status: MET   5. Pt will demonstrate appropriate performance of final prescribed HEP in order to facilitate improved self-management of symptoms post-discharge.             Baseline: HEP reviewed from recent POC   10/26/22: reports good HEP adherence   12/14/22: ***   Goal status: ***    6. Pt will be able to safely navigate 4 stairs without UE support and with reciprocal gait pattern in order to facilitate improved home access.  Baseline: requiring rail, step to pattern per pt report  10/26/22: ascending  reciprocal, descending step to   12/14/22: ***   Goal status: ***       PLAN: updated 12/14/22 ***    PT FREQUENCY: 1x/week ***    PT DURATION: 6 weeks ***    PLANNED INTERVENTIONS: Therapeutic exercises, Therapeutic activity, Neuromuscular re-education, Balance training, Gait training, Patient/Family education, Self Care, Joint mobilization, Stair training, Aquatic Therapy, Dry Needling, Electrical stimulation, Cryotherapy, Moist heat, Taping, Vasopneumatic device, Manual therapy, and Re-evaluation   PLAN FOR NEXT SESSION: review/update HEP PRN. Continue working on higher level balance - would likely benefit from more dynamic stability with dual tasking and environmental distractions. Pt states her end goal is to be able to run, even if only for short distance ***    Ashley Murrain PT, DPT 12/13/2022 1:27 PM

## 2022-12-14 ENCOUNTER — Ambulatory Visit: Payer: Self-pay | Admitting: Physical Therapy

## 2022-12-14 ENCOUNTER — Encounter: Payer: Self-pay | Admitting: Physical Therapy

## 2022-12-14 DIAGNOSIS — M25672 Stiffness of left ankle, not elsewhere classified: Secondary | ICD-10-CM

## 2022-12-14 DIAGNOSIS — M6281 Muscle weakness (generalized): Secondary | ICD-10-CM

## 2022-12-14 DIAGNOSIS — R2689 Other abnormalities of gait and mobility: Secondary | ICD-10-CM

## 2023-11-05 ENCOUNTER — Emergency Department (HOSPITAL_BASED_OUTPATIENT_CLINIC_OR_DEPARTMENT_OTHER)
Admission: EM | Admit: 2023-11-05 | Discharge: 2023-11-05 | Disposition: A | Discharge status: Home / Self Care | Payer: Self-pay | Attending: Emergency Medicine | Admitting: Emergency Medicine

## 2023-11-05 ENCOUNTER — Other Ambulatory Visit: Payer: Self-pay

## 2023-11-05 DIAGNOSIS — Z79899 Other long term (current) drug therapy: Secondary | ICD-10-CM | POA: Insufficient documentation

## 2023-11-05 DIAGNOSIS — R1010 Upper abdominal pain, unspecified: Secondary | ICD-10-CM

## 2023-11-05 DIAGNOSIS — R1011 Right upper quadrant pain: Secondary | ICD-10-CM | POA: Insufficient documentation

## 2023-11-05 DIAGNOSIS — Z7982 Long term (current) use of aspirin: Secondary | ICD-10-CM | POA: Insufficient documentation

## 2023-11-05 DIAGNOSIS — D72829 Elevated white blood cell count, unspecified: Secondary | ICD-10-CM | POA: Insufficient documentation

## 2023-11-05 LAB — URINALYSIS, ROUTINE W REFLEX MICROSCOPIC
Bilirubin Urine: NEGATIVE
Glucose, UA: NEGATIVE mg/dL
Hgb urine dipstick: NEGATIVE
Ketones, ur: NEGATIVE mg/dL
Leukocytes,Ua: NEGATIVE
Nitrite: POSITIVE — AB
Protein, ur: NEGATIVE mg/dL
Specific Gravity, Urine: <1.005 — ABNORMAL LOW (ref 1.005–1.030)
pH: 6.5 (ref 5.0–8.0)

## 2023-11-05 LAB — CBC WITH DIFFERENTIAL/PLATELET
Abs Immature Granulocytes: 0.02 K/uL (ref 0.00–0.07)
Basophils Absolute: 0.1 K/uL (ref 0.0–0.1)
Basophils Relative: 1 %
Eosinophils Absolute: 0.2 K/uL (ref 0.0–0.5)
Eosinophils Relative: 2 %
HCT: 37.3 % (ref 36.0–46.0)
Hemoglobin: 12.4 g/dL (ref 12.0–15.0)
Immature Granulocytes: 0 %
Lymphocytes Relative: 39 %
Lymphs Abs: 4.4 K/uL — ABNORMAL HIGH (ref 0.7–4.0)
MCH: 29.7 pg (ref 26.0–34.0)
MCHC: 33.2 g/dL (ref 30.0–36.0)
MCV: 89.2 fL (ref 80.0–100.0)
Monocytes Absolute: 0.8 K/uL (ref 0.1–1.0)
Monocytes Relative: 7 %
Neutro Abs: 5.7 K/uL (ref 1.7–7.7)
Neutrophils Relative %: 51 %
Platelets: 260 K/uL (ref 150–400)
RBC: 4.18 MIL/uL (ref 3.87–5.11)
RDW: 14.2 % (ref 11.5–15.5)
WBC: 11.1 K/uL — ABNORMAL HIGH (ref 4.0–10.5)
nRBC: 0 % (ref 0.0–0.2)

## 2023-11-05 LAB — LIPASE, BLOOD: Lipase: 25 U/L (ref 11–51)

## 2023-11-05 LAB — COMPREHENSIVE METABOLIC PANEL WITH GFR
ALT: 12 U/L (ref 0–44)
AST: 19 U/L (ref 15–41)
Albumin: 4.4 g/dL (ref 3.5–5.0)
Alkaline Phosphatase: 129 U/L — ABNORMAL HIGH (ref 38–126)
Anion gap: 12 (ref 5–15)
BUN: 12 mg/dL (ref 6–20)
CO2: 24 mmol/L (ref 22–32)
Calcium: 9.5 mg/dL (ref 8.9–10.3)
Chloride: 104 mmol/L (ref 98–111)
Creatinine, Ser: 0.64 mg/dL (ref 0.44–1.00)
GFR, Estimated: >60 mL/min (ref >60)
Glucose, Bld: 124 mg/dL — ABNORMAL HIGH (ref 70–99)
Potassium: 3.9 mmol/L (ref 3.5–5.1)
Sodium: 140 mmol/L (ref 135–145)
Total Bilirubin: <0.2 mg/dL (ref 0.0–1.2)
Total Protein: 7.7 g/dL (ref 6.5–8.1)

## 2023-11-05 LAB — HCG, SERUM, QUALITATIVE: Preg, Serum: NEGATIVE

## 2023-11-05 MED ORDER — ONDANSETRON 4 MG PO TBDP: ORAL_TABLET | ORAL | 0 refills | Status: AC

## 2023-11-05 MED ORDER — MORPHINE SULFATE (PF) 4 MG/ML IV SOLN
4.0000 mg | Freq: Once | INTRAVENOUS | Status: AC
  Administered 2023-11-05: 4 mg via INTRAVENOUS
  Filled 2023-11-05: qty 1

## 2023-11-05 MED ORDER — ONDANSETRON HCL 4 MG/2ML IJ SOLN
4.0000 mg | Freq: Once | INTRAMUSCULAR | Status: AC
  Administered 2023-11-05: 4 mg via INTRAVENOUS
  Filled 2023-11-05: qty 2

## 2023-11-05 NOTE — Discharge Instructions (Signed)
 Try pepcid or tagamet up to twice a day.  Try to avoid things that may make this worse, most commonly these are spicy foods tomato based products fatty foods chocolate and peppermint.  Alcohol and tobacco can also make this worse.  Return to the emergency department for sudden worsening pain fever or inability to eat or drink.  Pruebe con Pepcid o Tagamet [employer] al da. Evite los alimentos que puedan empeorar la situacin, como las comidas picantes, los productos a base de [place], los alimentos grasos, el chocolate y Interior and spatial designer. El alcohol y el tabaco tambin pueden empeorar la situacin. Regrese a urgencias si el dolor empeora repentinamente, tiene fiebre o no puede comer ni beber.

## 2023-11-05 NOTE — ED Provider Notes (Signed)
 [place] EMERGENCY DEPARTMENT AT [facility] Provider Note   CSN: [id number] Arrival date & time: 11/05/23  2221     Patient presents with: Abdominal Pain   Joanna Whitney is a 43 y.o. female.   72 yoF with a chief complaint of upper abdominal pain feels like a bloating seems to come and go.  Worse with eating.  Nothing seems to make it better.  Nauseated at times.  No fevers no vomiting.  Denies history of prior abdominal surgery.  Going on for a couple days now.  A language interpreter was used.  Abdominal Pain      Prior to Admission medications   Medication Sig Start Date End Date Taking? Authorizing Provider  ondansetron  (ZOFRAN -ODT) 4 MG disintegrating tablet 4mg  ODT q4 hours prn nausea/vomit 11/05/23  Yes Emil Share, DO  aspirin  EC 81 MG tablet Take 81 mg by mouth daily. Swallow whole.    [provider]  CALCIUM PO Take 1 tablet by mouth daily.    [provider]  Multiple Vitamin (MULTIVITAMIN WITH MINERALS) TABS tablet Take 1 tablet by mouth daily.    [provider]  ondansetron  (ZOFRAN ) 4 MG tablet Take 1 tablet (4 mg total) by mouth every 8 (eight) hours as needed for nausea or vomiting. 03/03/22   Josefina Chew, MD  oxyCODONE  (OXY IR/ROXICODONE ) 5 MG immediate release tablet Take 5 mg by mouth every 4 (four) hours as needed for moderate pain. 02/25/22   [provider]  oxyCODONE  (ROXICODONE ) 5 MG immediate release tablet Take 1 tablet (5 mg total) by mouth every 4 (four) hours as needed for severe pain. 03/03/22   Josefina Chew, MD  potassium chloride  (KLOR-CON ) 10 MEQ tablet Take 10 mEq by mouth daily. 02/08/22   [provider]    Allergies: Patient has no known allergies.    Review of Systems  Gastrointestinal:  Positive for abdominal pain.    Updated Vital Signs BP 128/81 (BP Location: Right Arm)   Pulse 81   Temp 98.6 F (37 C) (Oral)   Resp 17   Ht 5' 4 (1.626 m)   Wt 74.8 kg   SpO2 98%    BMI 28.31 kg/m   Physical Exam Vitals and nursing note reviewed.  Constitutional:      General: She is not in acute distress.    Appearance: She is well-developed. She is not diaphoretic.  HENT:     Head: Normocephalic and atraumatic.  Eyes:     Pupils: Pupils are equal, round, and reactive to light.  Cardiovascular:     Rate and Rhythm: Normal rate and regular rhythm.     Heart sounds: No murmur heard.    No friction rub. No gallop.  Pulmonary:     Effort: Pulmonary effort is normal.     Breath sounds: No wheezing or rales.  Abdominal:     General: There is no distension.     Palpations: Abdomen is soft.     Tenderness: There is abdominal tenderness in the epigastric area.     Comments: Pain worse to the epigastrium.  No obvious right upper quadrant tenderness.  Negative Murphy sign.  Musculoskeletal:        General: No tenderness.     Cervical back: Normal range of motion and neck supple.  Skin:    General: Skin is warm and dry.  Neurological:     Mental Status: She is alert and oriented to person, place, and time.  Psychiatric:  Behavior: Behavior normal.     (all labs ordered are listed, but only abnormal results are displayed) Labs Reviewed  CBC WITH DIFFERENTIAL/PLATELET - Abnormal; Notable for the following components:      Result Value   WBC 11.1 (*)    Lymphs Abs 4.4 (*)    All other components within normal limits  COMPREHENSIVE METABOLIC PANEL WITH GFR - Abnormal; Notable for the following components:   Glucose, Bld 124 (*)    Alkaline Phosphatase 129 (*)    All other components within normal limits  URINALYSIS, ROUTINE W REFLEX MICROSCOPIC - Abnormal; Notable for the following components:   Specific Gravity, Urine <1.005 (*)    Nitrite POSITIVE (*)    Bacteria, UA RARE (*)    All other components within normal limits  LIPASE, BLOOD  HCG, SERUM, QUALITATIVE    EKG: None  Radiology: No results found.   Procedures   Medications Ordered  in the ED  morphine  (PF) 4 MG/ML injection 4 mg (4 mg Intravenous Given 11/05/23 2240)  ondansetron  (ZOFRAN ) injection 4 mg (4 mg Intravenous Given 11/05/23 2240)                                    Medical Decision Making Amount and/or Complexity of Data Reviewed Labs: ordered.  Risk Prescription drug management.   42 yo F with a chief complaints of upper abdominal discomfort.  This been going on for a couple days.  No fevers no vomiting.  Mild diffuse abdominal discomfort on exam but worse to the epigastrium.  He has significant pain in the right upper quadrant negative Murphy sign.  Will obtain a laboratory evaluation and treat pain and nausea reassess.  LFTs and lipase unremarkable.  Mild leukocytosis.  Pregnancy test negative.  UA is confusing because it is nitrite positive but without white cells and no bacteria.  Without urinary symptoms we will hold off on treatment.  Will discharge home.  Trial of H2 blockers.  PCP follow-up.  11:35 PM:  I have discussed the diagnosis/risks/treatment options with the patient and family.  Evaluation and diagnostic testing in the emergency department does not suggest an emergent condition requiring admission or immediate intervention beyond what has been performed at this time.  They will follow up with PCP. We also discussed returning to the ED immediately if new or worsening sx occur. We discussed the sx which are most concerning (e.g., sudden worsening pain, fever, inability to tolerate by mouth) that necessitate immediate return. Medications administered to the patient during their visit and any new prescriptions provided to the patient are listed below.  Medications given during this visit Medications  morphine  (PF) 4 MG/ML injection 4 mg (4 mg Intravenous Given 11/05/23 2240)  ondansetron  (ZOFRAN ) injection 4 mg (4 mg Intravenous Given 11/05/23 2240)     The patient appears reasonably screen and/or stabilized for discharge and I  doubt any other medical condition or other [facility] requiring further screening, evaluation, or treatment in the ED at this time prior to discharge.       Final diagnoses:  Upper abdominal pain    ED Discharge Orders          Ordered    ondansetron  (ZOFRAN -ODT) 4 MG disintegrating tablet        11/05/23 2328               Emil Share, DO 11/05/23 2335

## 2023-11-05 NOTE — ED Triage Notes (Signed)
 Spanish speaking PT to exam 14 with son to translate at the request of PT. C/O Abd pain 4/10 x pressure in nature x 3 days. PT states she feels bloated and is tender to touch. PT denies ABD surgery, changes in bowel habits, fever or chills. Last BM 2 hours prior to arrival. VSS NAD PT on room air
